# Patient Record
Sex: Male | Born: 1990 | Race: White | Hispanic: No | Marital: Single | Smoking: Current some day smoker
Health system: Southern US, Community
[De-identification: ages and names within clinical notes are randomized; demographics above are authoritative.]

## PROBLEM LIST (undated history)

## (undated) DIAGNOSIS — K5090 Crohn's disease, unspecified, without complications: Secondary | ICD-10-CM

## (undated) DIAGNOSIS — E7800 Pure hypercholesterolemia, unspecified: Secondary | ICD-10-CM

## (undated) DIAGNOSIS — D649 Anemia, unspecified: Secondary | ICD-10-CM

## (undated) DIAGNOSIS — F1910 Other psychoactive substance abuse, uncomplicated: Secondary | ICD-10-CM

## (undated) DIAGNOSIS — F32A Depression, unspecified: Secondary | ICD-10-CM

## (undated) DIAGNOSIS — F329 Major depressive disorder, single episode, unspecified: Secondary | ICD-10-CM

## (undated) DIAGNOSIS — F319 Bipolar disorder, unspecified: Secondary | ICD-10-CM

## (undated) DIAGNOSIS — F1010 Alcohol abuse, uncomplicated: Secondary | ICD-10-CM

## (undated) DIAGNOSIS — F419 Anxiety disorder, unspecified: Secondary | ICD-10-CM

## (undated) DIAGNOSIS — F250 Schizoaffective disorder, bipolar type: Secondary | ICD-10-CM

## (undated) DIAGNOSIS — K259 Gastric ulcer, unspecified as acute or chronic, without hemorrhage or perforation: Secondary | ICD-10-CM

## (undated) DIAGNOSIS — F259 Schizoaffective disorder, unspecified: Secondary | ICD-10-CM

## (undated) DIAGNOSIS — F1990 Other psychoactive substance use, unspecified, uncomplicated: Secondary | ICD-10-CM

## (undated) DIAGNOSIS — T7840XA Allergy, unspecified, initial encounter: Secondary | ICD-10-CM

## (undated) DIAGNOSIS — K9041 Non-celiac gluten sensitivity: Secondary | ICD-10-CM

## (undated) DIAGNOSIS — F988 Other specified behavioral and emotional disorders with onset usually occurring in childhood and adolescence: Secondary | ICD-10-CM

## (undated) HISTORY — DX: Non-celiac gluten sensitivity: K90.41

## (undated) HISTORY — DX: Depression, unspecified: F32.A

## (undated) HISTORY — DX: Anxiety disorder, unspecified: F41.9

## (undated) HISTORY — DX: Alcohol abuse, uncomplicated: F10.10

## (undated) HISTORY — DX: Pure hypercholesterolemia, unspecified: E78.00

## (undated) HISTORY — DX: Other psychoactive substance abuse, uncomplicated: F19.10

## (undated) HISTORY — PX: SMALL INTESTINE SURGERY: SHX150

## (undated) HISTORY — PX: BOWEL RESECTION: SHX1257

## (undated) HISTORY — DX: Gastric ulcer, unspecified as acute or chronic, without hemorrhage or perforation: K25.9

## (undated) HISTORY — DX: Allergy, unspecified, initial encounter: T78.40XA

## (undated) HISTORY — DX: Other specified behavioral and emotional disorders with onset usually occurring in childhood and adolescence: F98.8

## (undated) HISTORY — DX: Major depressive disorder, single episode, unspecified: F32.9

## (undated) HISTORY — DX: Other psychoactive substance use, unspecified, uncomplicated: F19.90

## (undated) HISTORY — DX: Anemia, unspecified: D64.9

---
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
## 2011-06-19 DIAGNOSIS — F22 Delusional disorders: Secondary | ICD-10-CM | POA: Insufficient documentation

## 2011-06-20 ENCOUNTER — Emergency Department (HOSPITAL_COMMUNITY)
Admission: EM | Admit: 2011-06-20 | Discharge: 2011-06-20 | Disposition: A | Discharge status: Other Acute Inpatient Hospital | Payer: BC Managed Care – PPO | Attending: Emergency Medicine | Admitting: Emergency Medicine

## 2011-06-20 ENCOUNTER — Encounter: Payer: Self-pay | Admitting: *Deleted

## 2011-06-20 DIAGNOSIS — F22 Delusional disorders: Secondary | ICD-10-CM

## 2011-06-20 LAB — COMPREHENSIVE METABOLIC PANEL
ALT: 10 U/L (ref 0–53)
Alkaline Phosphatase: 82 U/L (ref 39–117)
BUN: 12 mg/dL (ref 6–23)
CO2: 24 mEq/L (ref 19–32)
Chloride: 105 mEq/L (ref 96–112)
GFR calc Af Amer: >90 mL/min (ref >90)
GFR calc non Af Amer: >90 mL/min (ref >90)
Glucose, Bld: 102 mg/dL — ABNORMAL HIGH (ref 70–99)
Potassium: 3.5 mEq/L (ref 3.5–5.1)
Sodium: 138 mEq/L (ref 135–145)
Total Bilirubin: 0.4 mg/dL (ref 0.3–1.2)

## 2011-06-20 LAB — CBC
HCT: 42.5 % (ref 39.0–52.0)
Hemoglobin: 15.3 g/dL (ref 13.0–17.0)
MCH: 31 pg (ref 26.0–34.0)
MCHC: 36 g/dL (ref 30.0–36.0)
MCV: 86 fL (ref 78.0–100.0)
Platelets: 249 10*3/uL (ref 150–400)
RBC: 4.94 MIL/uL (ref 4.22–5.81)
RDW: 13.1 % (ref 11.5–15.5)
WBC: 10.1 10*3/uL (ref 4.0–10.5)

## 2011-06-20 LAB — ETHANOL: Alcohol, Ethyl (B): <11 mg/dL (ref 0–11)

## 2011-06-20 LAB — RAPID URINE DRUG SCREEN, HOSP PERFORMED
Barbiturates: NOT DETECTED
Benzodiazepines: NOT DETECTED

## 2011-06-20 MED ORDER — ONDANSETRON HCL 4 MG PO TABS: 4.0000 mg | ORAL_TABLET | Freq: Three times a day (TID) | ORAL | Status: DC | PRN

## 2011-06-20 MED ORDER — IBUPROFEN 600 MG PO TABS: 600.0000 mg | ORAL_TABLET | Freq: Three times a day (TID) | ORAL | Status: DC | PRN

## 2011-06-20 MED ORDER — RISPERIDONE 2 MG PO TABS
2.0000 mg | ORAL_TABLET | Freq: Every day | ORAL | Status: DC
  Administered 2011-06-20: 2 mg via ORAL
  Filled 2011-06-20: qty 1

## 2011-06-20 NOTE — ED Notes (Signed)
Pt states he will not allow [place] to draw blood at this time

## 2011-06-20 NOTE — ED Provider Notes (Signed)
History     CSN: [id number] Arrival date & time: 06/20/2011 12:31 AM   First MD Initiated Contact with Patient 06/20/11 0210      Chief Complaint  Patient presents with  . Medical Clearance    (Consider location/radiation/quality/duration/timing/severity/associated sxs/prior treatment) The history is provided by the patient. No language interpreter was used.   Here with [employer] for IVC for delusional and paranoid behavior.  PMH of schizophrenia ? Taking meds.  Talking bazaar and states that the hobbit told him to come here.  Will medically clear for behavior health.   History reviewed. No pertinent past medical history.  History reviewed. No pertinent past surgical history.  History reviewed. No pertinent family history.  History  Substance Use Topics  . Smoking status: Current Everyday Smoker  . Smokeless tobacco: Not on file  . Alcohol Use: No      Review of Systems  All other systems reviewed and are negative.    Allergies  Review of patient's allergies indicates no known allergies.  Home Medications   Current Outpatient Rx  Name Route Sig Dispense Refill  . VITAMIN D 1000 UNITS PO TABS Oral Take 1,000 Units by mouth daily.      Marland Kitchen CLONAZEPAM 1 MG PO TABS Oral Take 2 mg by mouth every evening.      Marland Kitchen CLONAZEPAM 1 MG PO TABS Oral Take 0.5 mg by mouth 2 (two) times daily as needed.      Marland Kitchen FOLIC ACID 1 MG PO TABS Oral Take 1 mg by mouth daily.      Creed Copper M PLUS PO TABS Oral Take 1 tablet by mouth daily.      . QUETIAPINE FUMARATE 400 MG PO TB24 Oral Take 800 mg by mouth at bedtime.      Marland Kitchen VITAMIN B-12 1000 MCG PO TABS Oral Take 2,000 mcg by mouth daily.        BP 99/38  Pulse 84  Temp(Src) 97.8 F (36.6 C) (Oral)  Resp 18  SpO2 99%  Physical Exam  Nursing note and vitals reviewed. Constitutional: He is oriented to person, place, and time. He appears well-developed and well-nourished. He appears distressed.  Eyes: Pupils are equal, round, and reactive to  light.  Neck: Normal range of motion. Neck supple.  Cardiovascular: Normal rate.   Pulmonary/Chest: Effort normal and breath sounds normal. No respiratory distress. He has no wheezes.  Abdominal: Soft. Bowel sounds are normal.  Musculoskeletal: Normal range of motion. He exhibits no edema and no tenderness.  Neurological: He is alert and oriented to person, place, and time.  Skin: Skin is warm and dry. He is not diaphoretic.  Psychiatric: He has a normal mood and affect.    ED Course  Procedures (including critical care time)  Labs Reviewed  COMPREHENSIVE METABOLIC PANEL - Abnormal; Notable for the following:    Glucose, Bld 102 (*)    All other components within normal limits  CBC  ETHANOL  URINE RAPID DRUG SCREEN (HOSP PERFORMED)   No results found.   No diagnosis found.    MDM  Here with IVC papers for bazaar behavior.  Delusional and hallucinating.  Denies suicial ideations and homocidal ideations.          Sheryle Hail, NP 06/20/11 [phone]
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
## 2011-06-20 NOTE — BH Assessment (Signed)
CSW completed 1st examination for patient's IVC. Patient accepted to [facility], by Dr. Reece Levy.  Patient is IVC and will be transported by sheriff. CSW spoke with [facility] and informed them of status.  Labs and mobile crisis assessment were faxed over to [facility]  Patient's nurse notified that patient is ready for transport.  Laurena Spies , MSW, LCSWA 06/20/2011 8:27 AM

## 2011-06-20 NOTE — ED Notes (Signed)
One patient belonging bag locked in activity room.
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
## 2011-06-20 NOTE — ED Provider Notes (Signed)
Medical screening examination/treatment/procedure(s) were performed by non-physician practitioner and as supervising physician I was immediately available for consultation/collaboration.   Mikalah Skyles A. Lauris Poag, MD 06/20/11 2302

## 2011-06-20 NOTE — ED Notes (Signed)
Report given to Debby Freiberg RN at [facility].

## 2011-07-05 ENCOUNTER — Encounter (HOSPITAL_COMMUNITY): Payer: Self-pay | Admitting: *Deleted

## 2011-07-05 ENCOUNTER — Ambulatory Visit (HOSPITAL_COMMUNITY)
Admission: RE | Admit: 2011-07-05 | Discharge: 2011-07-05 | Disposition: A | Discharge status: Home / Self Care | Payer: BC Managed Care – PPO | Source: Ambulatory Visit | Attending: Psychiatry | Admitting: Psychiatry

## 2011-07-05 DIAGNOSIS — F259 Schizoaffective disorder, unspecified: Secondary | ICD-10-CM | POA: Insufficient documentation
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
## 2011-07-21 ENCOUNTER — Encounter (HOSPITAL_COMMUNITY): Payer: Self-pay

## 2011-07-21 ENCOUNTER — Other Ambulatory Visit (HOSPITAL_COMMUNITY): Payer: BC Managed Care – PPO | Attending: Psychiatry

## 2011-07-21 NOTE — Progress Notes (Signed)
Patient ID: Seth Fields, male   DOB: 1990-12-19, 21 y.o.   MRN: [id number] Patient arrived accompanied by father to begin Psych-IOP program. Patient's father completed initial assessment paperwork and then patient accompanied patient to writer's office for initial assessment. During the assessment it appeared that patient was responding to internal stimuli. When patient was asked what his goals where for the IOP program he stated "I want to gain the trust back of my parents and I want them to give me my car back." Patient exhibited disorganized thinking at times. Patient escorted into group room to join group which was already in progress. Patient was pulled out of group mid- morning to speak with Dr. Salem Senate. Patient continued to respond to internal stimuli during Dr. Juliane Lack assessment. After assessment, patient was escorted to waiting room while Dr. Salem Senate and writer spoke with mother on speaker phone to let her know that patient was not appropriate for Psych-IOP. Mother asked for suggestions for further treatment options and it was suggested that patient seek referral to CD-IOP. Patient given contact information to speak with Brandon Melnick (IOP therapist). Mother requested that information on this morning's assessment be communicated to Dr. Reece Levy.
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
## 2011-07-21 NOTE — Progress Notes (Signed)
Patient ID: Seth Fields, male   DOB: [birth year], 21 y.o.   MRN: [id number] Dr. Juliane Lack assessment faxed to [facility].

## 2011-07-24 ENCOUNTER — Encounter (HOSPITAL_COMMUNITY): Payer: Self-pay | Admitting: Emergency Medicine

## 2011-07-24 ENCOUNTER — Other Ambulatory Visit: Payer: Self-pay

## 2011-07-24 ENCOUNTER — Emergency Department (HOSPITAL_COMMUNITY): Payer: BC Managed Care – PPO

## 2011-07-24 ENCOUNTER — Emergency Department (HOSPITAL_COMMUNITY)
Admission: EM | Admit: 2011-07-24 | Discharge: 2011-07-25 | Disposition: A | Discharge status: Other Acute Inpatient Hospital | Payer: BC Managed Care – PPO | Attending: Internal Medicine | Admitting: Internal Medicine

## 2011-07-24 DIAGNOSIS — F329 Major depressive disorder, single episode, unspecified: Secondary | ICD-10-CM

## 2011-07-24 DIAGNOSIS — K5090 Crohn's disease, unspecified, without complications: Secondary | ICD-10-CM | POA: Insufficient documentation

## 2011-07-24 DIAGNOSIS — R002 Palpitations: Secondary | ICD-10-CM | POA: Insufficient documentation

## 2011-07-24 DIAGNOSIS — J45909 Unspecified asthma, uncomplicated: Secondary | ICD-10-CM | POA: Insufficient documentation

## 2011-07-24 DIAGNOSIS — R443 Hallucinations, unspecified: Secondary | ICD-10-CM | POA: Insufficient documentation

## 2011-07-24 DIAGNOSIS — F319 Bipolar disorder, unspecified: Secondary | ICD-10-CM | POA: Insufficient documentation

## 2011-07-24 DIAGNOSIS — F17200 Nicotine dependence, unspecified, uncomplicated: Secondary | ICD-10-CM | POA: Insufficient documentation

## 2011-07-24 HISTORY — DX: Schizoaffective disorder, unspecified: F25.9

## 2011-07-24 HISTORY — DX: Bipolar disorder, unspecified: F31.9

## 2011-07-24 HISTORY — DX: Schizoaffective disorder, bipolar type: F25.0

## 2011-07-24 HISTORY — DX: Crohn's disease, unspecified, without complications: K50.90

## 2011-07-24 LAB — POCT I-STAT, CHEM 8
BUN: 16 mg/dL (ref 6–23)
Chloride: 105 mEq/L (ref 96–112)
Creatinine, Ser: 0.8 mg/dL (ref 0.50–1.35)
Glucose, Bld: 122 mg/dL — ABNORMAL HIGH (ref 70–99)
Potassium: 3.5 mEq/L (ref 3.5–5.1)
Sodium: 140 mEq/L (ref 135–145)

## 2011-07-24 LAB — CBC
HCT: 42.5 % (ref 39.0–52.0)
Hemoglobin: 15.5 g/dL (ref 13.0–17.0)
MCV: 84.3 fL (ref 78.0–100.0)
RBC: 5.04 MIL/uL (ref 4.22–5.81)
RDW: 12.4 % (ref 11.5–15.5)
WBC: 8.5 10*3/uL (ref 4.0–10.5)

## 2011-07-24 LAB — DIFFERENTIAL
Basophils Absolute: 0 10*3/uL (ref 0.0–0.1)
Lymphocytes Relative: 23 % (ref 12–46)
Lymphs Abs: 2 10*3/uL (ref 0.7–4.0)
Monocytes Absolute: 0.6 10*3/uL (ref 0.1–1.0)
Neutro Abs: 5.7 10*3/uL (ref 1.7–7.7)

## 2011-07-24 MED ORDER — SODIUM CHLORIDE 0.9 % IV BOLUS (SEPSIS)
1000.0000 mL | Freq: Once | INTRAVENOUS | Status: AC
Start: 2011-07-24 — End: 2011-07-25
  Administered 2011-07-25: 1000 mL via INTRAVENOUS

## 2011-07-24 NOTE — ED Notes (Signed)
Pt alert, c/o "feeling weird", presents via POV, recently seen in ED for Psych Disorder, resp even, pt appears anxious. Pt denies ingestion of anything not prescribed

## 2011-07-24 NOTE — ED Provider Notes (Signed)
History     CSN: [id number]  Arrival date & time 07/24/11  2304   First MD Initiated Contact with Patient 07/24/11 2319      Chief Complaint  Patient presents with  . Tachycardia  . Medical Clearance    (Consider location/radiation/quality/duration/timing/severity/associated sxs/prior treatment) Patient is a 21 y.o. male presenting with mental health disorder and shortness of breath. The history is provided by the patient and a parent.  Mental Health Problem The primary symptoms include dysphoric mood and hallucinations. The current episode started today. This is a recurrent problem.  The hallucinations began this week. The hallucinations appear to have been unchanged since their onset.  Precipitated by: nothing. The degree of incapacity that he is experiencing as a consequence of his illness is moderate. Additional symptoms of the illness do not include no insomnia, no hypersomnia, no appetite change, no unexpected weight change, no fatigue, no feelings of worthlessness, no increased goal-directed activity, no flight of ideas, no visual change, no abdominal pain or no seizures. He does not admit to suicidal ideas. He does not have a plan to commit suicide. He does not contemplate harming himself. He has not already injured self. He does not contemplate injuring another person. He has not already  injured another person. Risk factors that are present for mental illness include a history of mental illness.  Shortness of Breath  The current episode started today. The onset was sudden. The problem occurs continuously. The problem has been unchanged. The problem is moderate. The symptoms are relieved by rest. Associated symptoms include shortness of breath. Pertinent negatives include no chest pain, no chest pressure, no orthopnea, no fever, no sore throat, no stridor, no cough and no wheezing. There was no intake of a foreign body. He was not exposed to toxic fumes. He has not inhaled smoke  recently. He has had no prior steroid use. He has had no prior ICU admissions. His past medical history does not include asthma or past wheezing. He has been behaving normally. Urine output has decreased. There were no sick contacts. Recently, medical care has been given at this facility. Services received include tests performed.  Mother reports she doses the patient's meds and he could not have taken extra but patient reports he took his own meds.  States he did not take anything that wasn't prescribed to him  Past Medical History  Diagnosis Date  . Asthma     Pt reports this is resolved  . Bipolar affective   . Schizo affective schizophrenia   . Crohn's disease     Past Surgical History  Procedure Date  . Abdominal surgery     No family history on file.  History  Substance Use Topics  . Smoking status: Current Everyday Smoker -- 0.1 packs/day    Types: Cigarettes  . Smokeless tobacco: Not on file  . Alcohol Use: No      Review of Systems  Constitutional: Negative for fever, appetite change, fatigue and unexpected weight change.  HENT: Negative for sore throat.   Eyes: Negative.   Respiratory: Positive for shortness of breath. Negative for cough, wheezing and stridor.   Cardiovascular: Negative for chest pain and orthopnea.  Gastrointestinal: Negative for nausea, vomiting, abdominal pain and diarrhea.  Genitourinary: Negative.   Musculoskeletal: Negative.   Skin: Negative.   Neurological: Negative for seizures.  Psychiatric/Behavioral: Positive for hallucinations and dysphoric mood. Negative for suicidal ideas and self-injury. The patient is nervous/anxious. The patient does not have insomnia.  Allergies  Seasonal  Home Medications   Current Outpatient Rx  Name Route Sig Dispense Refill  . AZATHIOPRINE 50 MG PO TABS Oral Take 100 mg by mouth daily. Cont home med    . BENZTROPINE MESYLATE 1 MG PO TABS Oral Take 1 mg by mouth 2 (two) times daily.      Marland Kitchen VITAMIN  D 1000 UNITS PO TABS Oral Take 1,000 Units by mouth daily.      Marland Kitchen CLONAZEPAM 1 MG PO TABS Oral Take 2 mg by mouth every evening.      Marland Kitchen CLONAZEPAM 1 MG PO TABS Oral Take 0.5 mg by mouth 2 (two) times daily as needed.      Marland Kitchen DIVALPROEX SODIUM ER 500 MG PO TB24 Oral Take 1,500 mg by mouth daily. 1500 mg po q pm    . FOLIC ACID 1 MG PO TABS Oral Take 1 mg by mouth daily.      Creed Copper M PLUS PO TABS Oral Take 1 tablet by mouth daily.      Marland Kitchen PALIPERIDONE ER 6 MG PO TB24 Oral Take 6 mg by mouth daily.     Marland Kitchen PALIPERIDONE PALMITATE 117 MG/0.75ML IM SUSP Intramuscular Inject 117 mg/mL into the muscle every 30 (thirty) days.    Marland Kitchen QUETIAPINE FUMARATE ER 400 MG PO TB24 Oral Take 800 mg by mouth at bedtime.     Marland Kitchen VITAMIN B-12 1000 MCG PO TABS Oral Take 2,000 mcg by mouth daily.        BP 127/52  Pulse 180  Temp 98 F (36.7 C)  Resp 22  Wt 160 lb (72.576 kg)  SpO2 99%  Physical Exam  Constitutional: He is oriented to person, place, and time. He appears well-developed and well-nourished.  HENT:  Head: Normocephalic and atraumatic.  Mouth/Throat: No oropharyngeal exudate.       Tacky mucus membranes  Eyes: Conjunctivae are normal. Pupils are equal, round, and reactive to light.  Neck: Normal range of motion. Neck supple. No tracheal deviation present.  Cardiovascular: Regular rhythm.  Tachycardia present.   Pulmonary/Chest: Effort normal and breath sounds normal. He has no wheezes. He has no rales.  Abdominal: Soft. Bowel sounds are normal. There is no tenderness. There is no rebound and no guarding.  Musculoskeletal: Normal range of motion.  Lymphadenopathy:    He has no cervical adenopathy.  Neurological: He is alert and oriented to person, place, and time. He has normal reflexes.  Skin: Skin is warm and dry. He is not diaphoretic.  Psychiatric: His mood appears anxious. He is actively hallucinating. Thought content is paranoid and delusional.    ED Course  Procedures (including critical  care time)   Labs Reviewed  CBC  DIFFERENTIAL  I-STAT, CHEM 8  URINE RAPID DRUG SCREEN (HOSP PERFORMED)  ACETAMINOPHEN LEVEL  SALICYLATE LEVEL  VALPROIC ACID LEVEL  HEPATIC FUNCTION PANEL  ETHANOL   No results found.   No diagnosis found.    MDM   Date: 07/25/2011  Rate: 126  Rhythm: sinus tachycardia  QRS Axis: normal  Intervals: normal  ST/T Wave abnormalities: normal  Conduction Disutrbances:none  Narrative Interpretation:   Old EKG Reviewed: none available  3 liters of fluid given to rehydrate patient and slow down heart rate.  Now less than 100, is able to PO challenge will send to the psych ED negative dimer and troponin, likely medication effect  MDM Reviewed: previous chart and vitals Interpretation: labs, ECG and x-ray    CRITICAL CARE  Performed by: Carlisle Beers   Total critical care time: 60 minutes  Critical care time was exclusive of separately billable procedures and treating other patients.  Critical care was necessary to treat or prevent imminent or life-threatening deterioration.  Critical care was time spent personally by me on the following activities: development of treatment plan with patient and/or surrogate as well as nursing, discussions with consultants, evaluation of patient's response to treatment, examination of patient, obtaining history from patient or surrogate, ordering and performing treatments and interventions, ordering and review of laboratory studies, ordering and review of radiographic studies, pulse oximetry and re-evaluation of patient's condition.     Carlisle Beers, MD 07/25/11 [phone]

## 2011-07-25 LAB — POCT I-STAT TROPONIN I: Troponin i, poc: 0 ng/mL (ref 0.00–0.08)

## 2011-07-25 LAB — HEPATIC FUNCTION PANEL
ALT: 10 U/L (ref 0–53)
AST: 21 U/L (ref 0–37)
Albumin: 4.3 g/dL (ref 3.5–5.2)

## 2011-07-25 LAB — RAPID URINE DRUG SCREEN, HOSP PERFORMED
Amphetamines: NOT DETECTED
Benzodiazepines: NOT DETECTED
Opiates: NOT DETECTED
Tetrahydrocannabinol: NOT DETECTED

## 2011-07-25 LAB — VALPROIC ACID LEVEL: Valproic Acid Lvl: 91.2 ug/mL (ref 50.0–100.0)

## 2011-07-25 LAB — SALICYLATE LEVEL: Salicylate Lvl: <2 mg/dL — ABNORMAL LOW (ref 2.8–20.0)

## 2011-07-25 MED ORDER — ONDANSETRON HCL 4 MG PO TABS: 4.0000 mg | ORAL_TABLET | Freq: Three times a day (TID) | ORAL | Status: DC | PRN

## 2011-07-25 MED ORDER — LORAZEPAM 2 MG/ML IJ SOLN
0.5000 mg | Freq: Once | INTRAMUSCULAR | Status: AC
  Administered 2011-07-25: 0.5 mg via INTRAVENOUS
  Filled 2011-07-25: qty 1

## 2011-07-25 MED ORDER — SODIUM CHLORIDE 0.9 % IV BOLUS (SEPSIS)
1000.0000 mL | Freq: Once | INTRAVENOUS | Status: AC
  Administered 2011-07-25: 1000 mL via INTRAVENOUS

## 2011-07-25 MED ORDER — IBUPROFEN 600 MG PO TABS: 600.0000 mg | ORAL_TABLET | Freq: Three times a day (TID) | ORAL | Status: DC | PRN

## 2011-07-25 MED ORDER — ACETAMINOPHEN 325 MG PO TABS: 650.0000 mg | ORAL_TABLET | ORAL | Status: DC | PRN

## 2011-07-25 NOTE — ED Notes (Signed)
Patient's mother leaving at this time. Patient's mother verbalized importance of having her name and phone numbers on file because she is the [place]. Patient's mother is Ezechiel Stooksbury and she can be reached by cell phone [phone] or home phone [phone].
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
## 2011-07-25 NOTE — ED Notes (Signed)
Patient reports he is "hallucinating" but unable to verbalize what he sees.  Also states "I feel like I'm going to die". Dr. Chaya Jan notified and aware. Patient continues to be on the monitor with mother at bedside.

## 2011-07-25 NOTE — ED Provider Notes (Signed)
History     CSN: [id number]  Arrival date & time 07/24/11  2304   First MD Initiated Contact with Patient 07/24/11 2319      Chief Complaint  Patient presents with  . Tachycardia  . Medical Clearance    (Consider location/radiation/quality/duration/timing/severity/associated sxs/prior treatment) HPI  Past Medical History  Diagnosis Date  . Asthma     Pt reports this is resolved  . Bipolar affective   . Schizo affective schizophrenia   . Crohn's disease     Past Surgical History  Procedure Date  . Abdominal surgery     No family history on file.  History  Substance Use Topics  . Smoking status: Current Everyday Smoker -- 0.1 packs/day    Types: Cigarettes  . Smokeless tobacco: Not on file  . Alcohol Use: No      Review of Systems  Allergies  Gluten and Seasonal  Home Medications   Current Outpatient Rx  Name Route Sig Dispense Refill  . AZATHIOPRINE 50 MG PO TABS Oral Take 100 mg by mouth daily. Cont home med    . BENZTROPINE MESYLATE 1 MG PO TABS Oral Take 1 mg by mouth 2 (two) times daily.      Marland Kitchen VITAMIN D 1000 UNITS PO TABS Oral Take 1,000 Units by mouth daily.      Marland Kitchen CLONAZEPAM 1 MG PO TABS Oral Take 2 mg by mouth every evening.      Marland Kitchen CLONAZEPAM 1 MG PO TABS Oral Take 0.5 mg by mouth 2 (two) times daily as needed.      Marland Kitchen DIVALPROEX SODIUM ER 500 MG PO TB24 Oral Take 1,500 mg by mouth daily. 1500 mg po q pm    . FOLIC ACID 1 MG PO TABS Oral Take 1 mg by mouth daily.      Creed Copper M PLUS PO TABS Oral Take 1 tablet by mouth daily.      Marland Kitchen PALIPERIDONE ER 6 MG PO TB24 Oral Take 6 mg by mouth daily.     Marland Kitchen PALIPERIDONE PALMITATE 117 MG/0.75ML IM SUSP Intramuscular Inject 117 mg/mL into the muscle every 30 (thirty) days.    Marland Kitchen QUETIAPINE FUMARATE ER 400 MG PO TB24 Oral Take 800 mg by mouth at bedtime.     Marland Kitchen VITAMIN B-12 1000 MCG PO TABS Oral Take 2,000 mcg by mouth daily.        BP 119/74  Pulse 94  Temp(Src) 98.4 F (36.9 C) (Oral)  Resp 18  Wt 160  lb (72.576 kg)  SpO2 96%  Physical Exam  ED Course  Procedures (including critical care time)  Labs Reviewed  CBC - Abnormal; Notable for the following:    MCHC 36.5 (*)    All other components within normal limits  SALICYLATE LEVEL - Abnormal; Notable for the following:    Salicylate Lvl <7.7 (*)    All other components within normal limits  POCT I-STAT, CHEM 8 - Abnormal; Notable for the following:    Glucose, Bld 122 (*)    All other components within normal limits  DIFFERENTIAL  URINE RAPID DRUG SCREEN (HOSP PERFORMED)  ACETAMINOPHEN LEVEL  VALPROIC ACID LEVEL  HEPATIC FUNCTION PANEL  ETHANOL  D-DIMER, QUANTITATIVE  POCT I-STAT TROPONIN I  I-STAT, CHEM 8  I-STAT TROPONIN I   Dg Chest 2 View  07/24/2011  *RADIOLOGY REPORT*  Clinical Data: Chest pain  CHEST - 2 VIEW  Comparison: None.  Findings: Lungs are essentially clear.  No pleural effusion or  pneumothorax.  Cardiomediastinal silhouette is within normal limits.  Visualized osseous structures are within normal limits.  IMPRESSION: No evidence of acute cardiopulmonary disease.  Original Report Authenticated By: Julian Hy, M.D.     No diagnosis found.    MDM  Patient admitted to [facility]. Dr. Reece Levy accepting physician          Nat Christen, MD 08/01/11 [phone]

## 2011-07-25 NOTE — ED Notes (Signed)
Pt. Pulse rate 115 walking.

## 2011-07-25 NOTE — ED Notes (Signed)
Palumbo, MD at bedside.

## 2011-07-25 NOTE — ED Notes (Addendum)
Excuse last note family was not at bedside

## 2011-07-25 NOTE — ED Notes (Signed)
Father called, wanted to speak to patient, patient said he would rather not talk to father and protect his privacy

## 2011-07-25 NOTE — ED Notes (Addendum)
Info sent to [facility] and [facility] for review. TC from [facility] @ [place]. Pt accepted by Dr. Reece Levy. RN report to be called to [phone].

## 2011-10-03 DIAGNOSIS — K50819 Crohn's disease of both small and large intestine with unspecified complications: Secondary | ICD-10-CM | POA: Insufficient documentation

## 2012-01-30 ENCOUNTER — Ambulatory Visit (INDEPENDENT_AMBULATORY_CARE_PROVIDER_SITE_OTHER): Payer: BC Managed Care – PPO | Admitting: Internal Medicine

## 2012-01-30 VITALS — BP 128/80 | HR 94 | Temp 98.9°F | Resp 20 | Ht 72.0 in | Wt 164.0 lb

## 2012-01-30 DIAGNOSIS — F1610 Hallucinogen abuse, uncomplicated: Secondary | ICD-10-CM

## 2012-01-30 DIAGNOSIS — K5090 Crohn's disease, unspecified, without complications: Secondary | ICD-10-CM | POA: Insufficient documentation

## 2012-01-30 DIAGNOSIS — F909 Attention-deficit hyperactivity disorder, unspecified type: Secondary | ICD-10-CM

## 2012-01-30 DIAGNOSIS — F2081 Schizophreniform disorder: Secondary | ICD-10-CM

## 2012-01-30 DIAGNOSIS — F411 Generalized anxiety disorder: Secondary | ICD-10-CM

## 2012-01-30 DIAGNOSIS — F1410 Cocaine abuse, uncomplicated: Secondary | ICD-10-CM

## 2012-01-30 MED ORDER — ARIPIPRAZOLE ER 400 MG IM SUSR: 400.0000 mg | INTRAMUSCULAR | Status: DC

## 2012-01-30 MED ORDER — ARIPIPRAZOLE ER 400 MG IM SUSR
400.0000 mg | Freq: Once | INTRAMUSCULAR | Status: AC
  Administered 2012-01-30: 400 mg via INTRAMUSCULAR
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
## 2012-02-10 ENCOUNTER — Telehealth: Payer: Self-pay

## 2012-02-10 NOTE — Telephone Encounter (Signed)
[employer] called on behalf of the patient because patient was prescribed an injection form of abilify that needs to be administered by a provider. Patient is not able to purchase this from pharmacy since MD has to administer. Pt's psychiatrist will not purchase the rx in advance and bill [employer] who will then pay in full in order to administer to the patient. They are wondering if we can do this for the patient. He is also a patient of Dr. Laney Pastor.  Tiffany from [facility] [phone]

## 2012-02-10 NOTE — Telephone Encounter (Signed)
Call [facility] Can we send him over with a prescription which he then brings back for [place] to administer? Psychiatry would need to provide [place] with a dose and frequency of administration written as an order

## 2012-02-10 NOTE — Telephone Encounter (Signed)
See message, if patient gets injectible form of abilify can we administer here?

## 2012-02-11 NOTE — Telephone Encounter (Signed)
Seth Fields with Seth Fields will cover the medications at 100%. Physician office has to purchase the medication $1900 then file the medication with [employer] they will cover, they want to know if we will purchase the medication to administer it to him, then file it to [facility]. Please advise, I told her I do not know if we can purchase a medication this expensive for him without seeking an alternative first.

## 2012-02-11 NOTE — Telephone Encounter (Signed)
This is not something we can do The psychiatrist has responsibility to figure this out for him, not us-(as much as I respect his Mom) If you will get me the psychiatrist name I'll call and ask them why they can't do this Tell Mom the psych group has to be responsible here

## 2012-02-11 NOTE — Telephone Encounter (Signed)
Left message for Tiffany to call back.

## 2012-02-13 NOTE — Telephone Encounter (Signed)
Have called Tiffany to advise we can not purchase the medication, if he can get it from another source, we could administer.

## 2012-02-24 ENCOUNTER — Encounter: Payer: Self-pay | Admitting: Internal Medicine

## 2012-03-01 IMAGING — CR DG CHEST 2V
2 series · 2 of 2 positions shown · non-contrast
Comparison: None.

CLINICAL DATA: Chest pain

CHEST - 2 VIEW

[w chest pa]
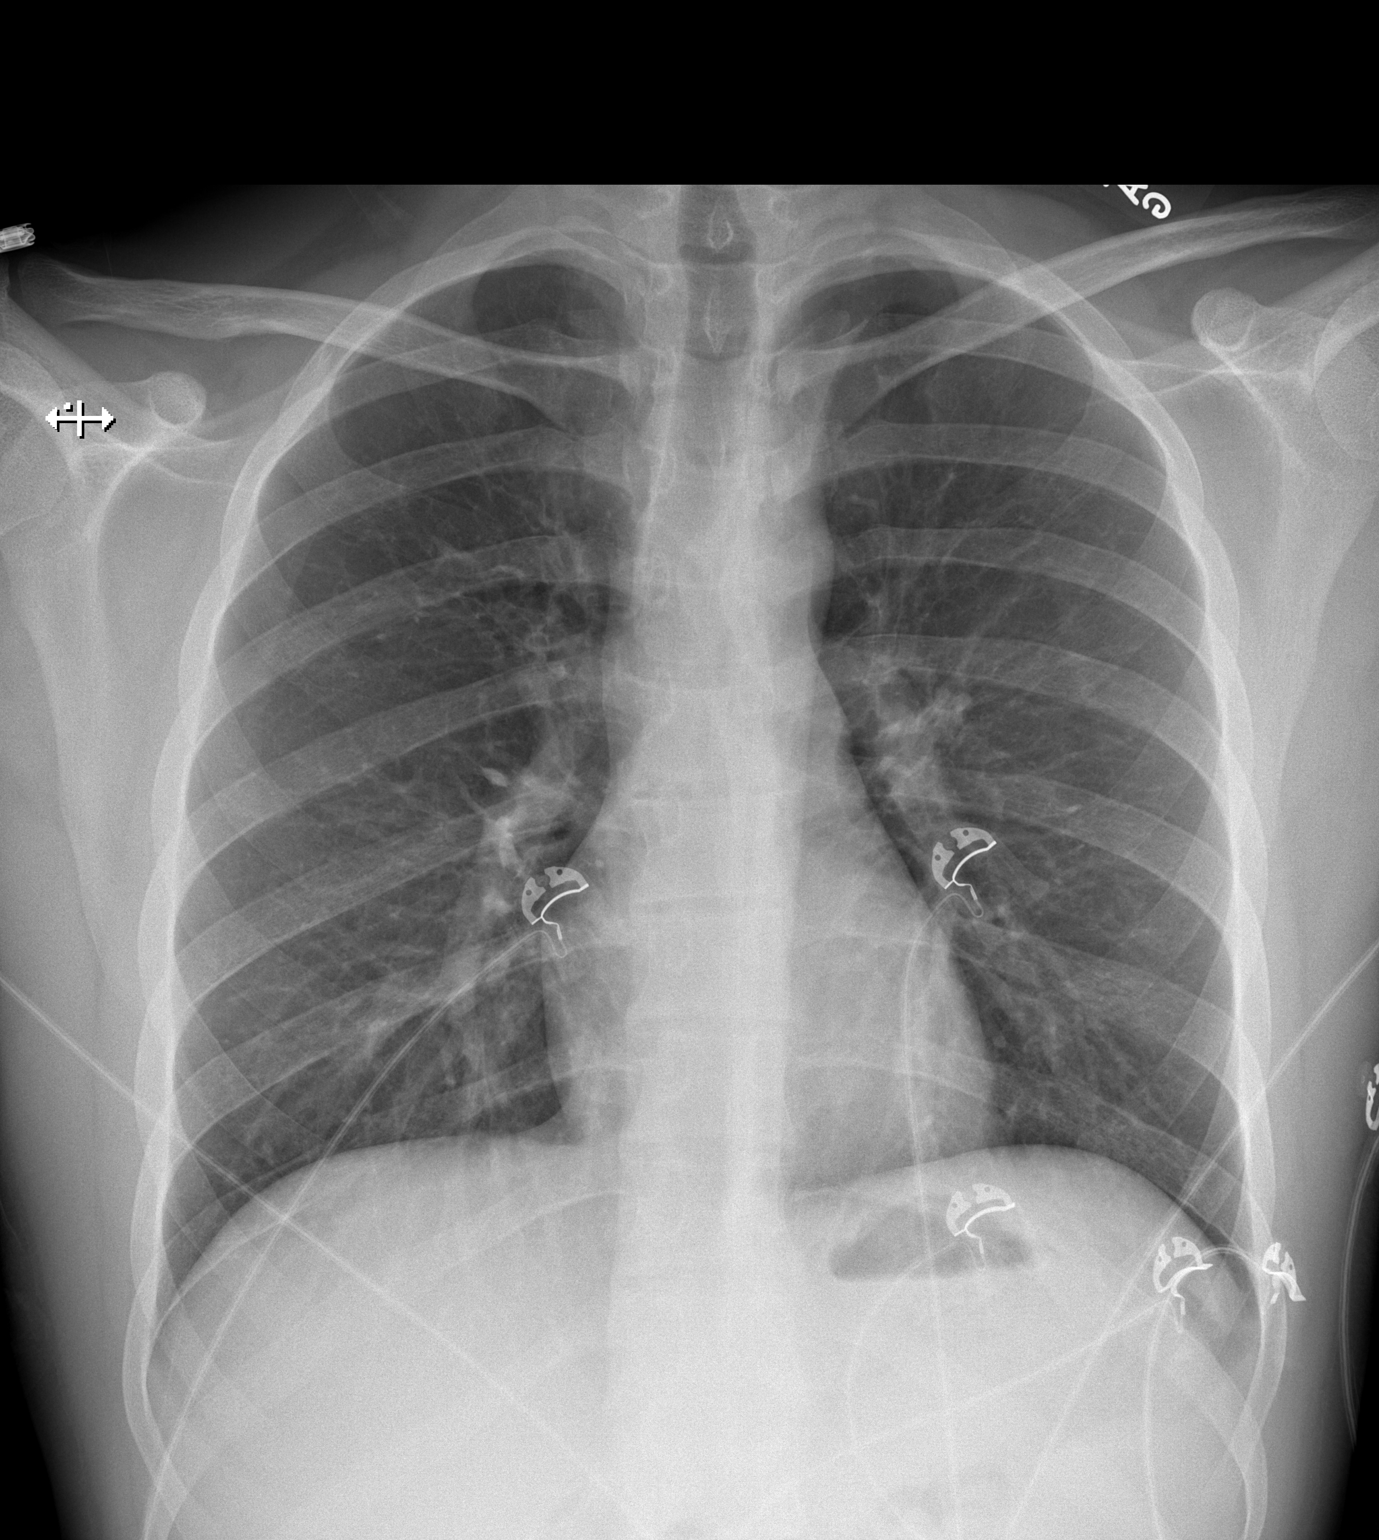

[w chest lat]
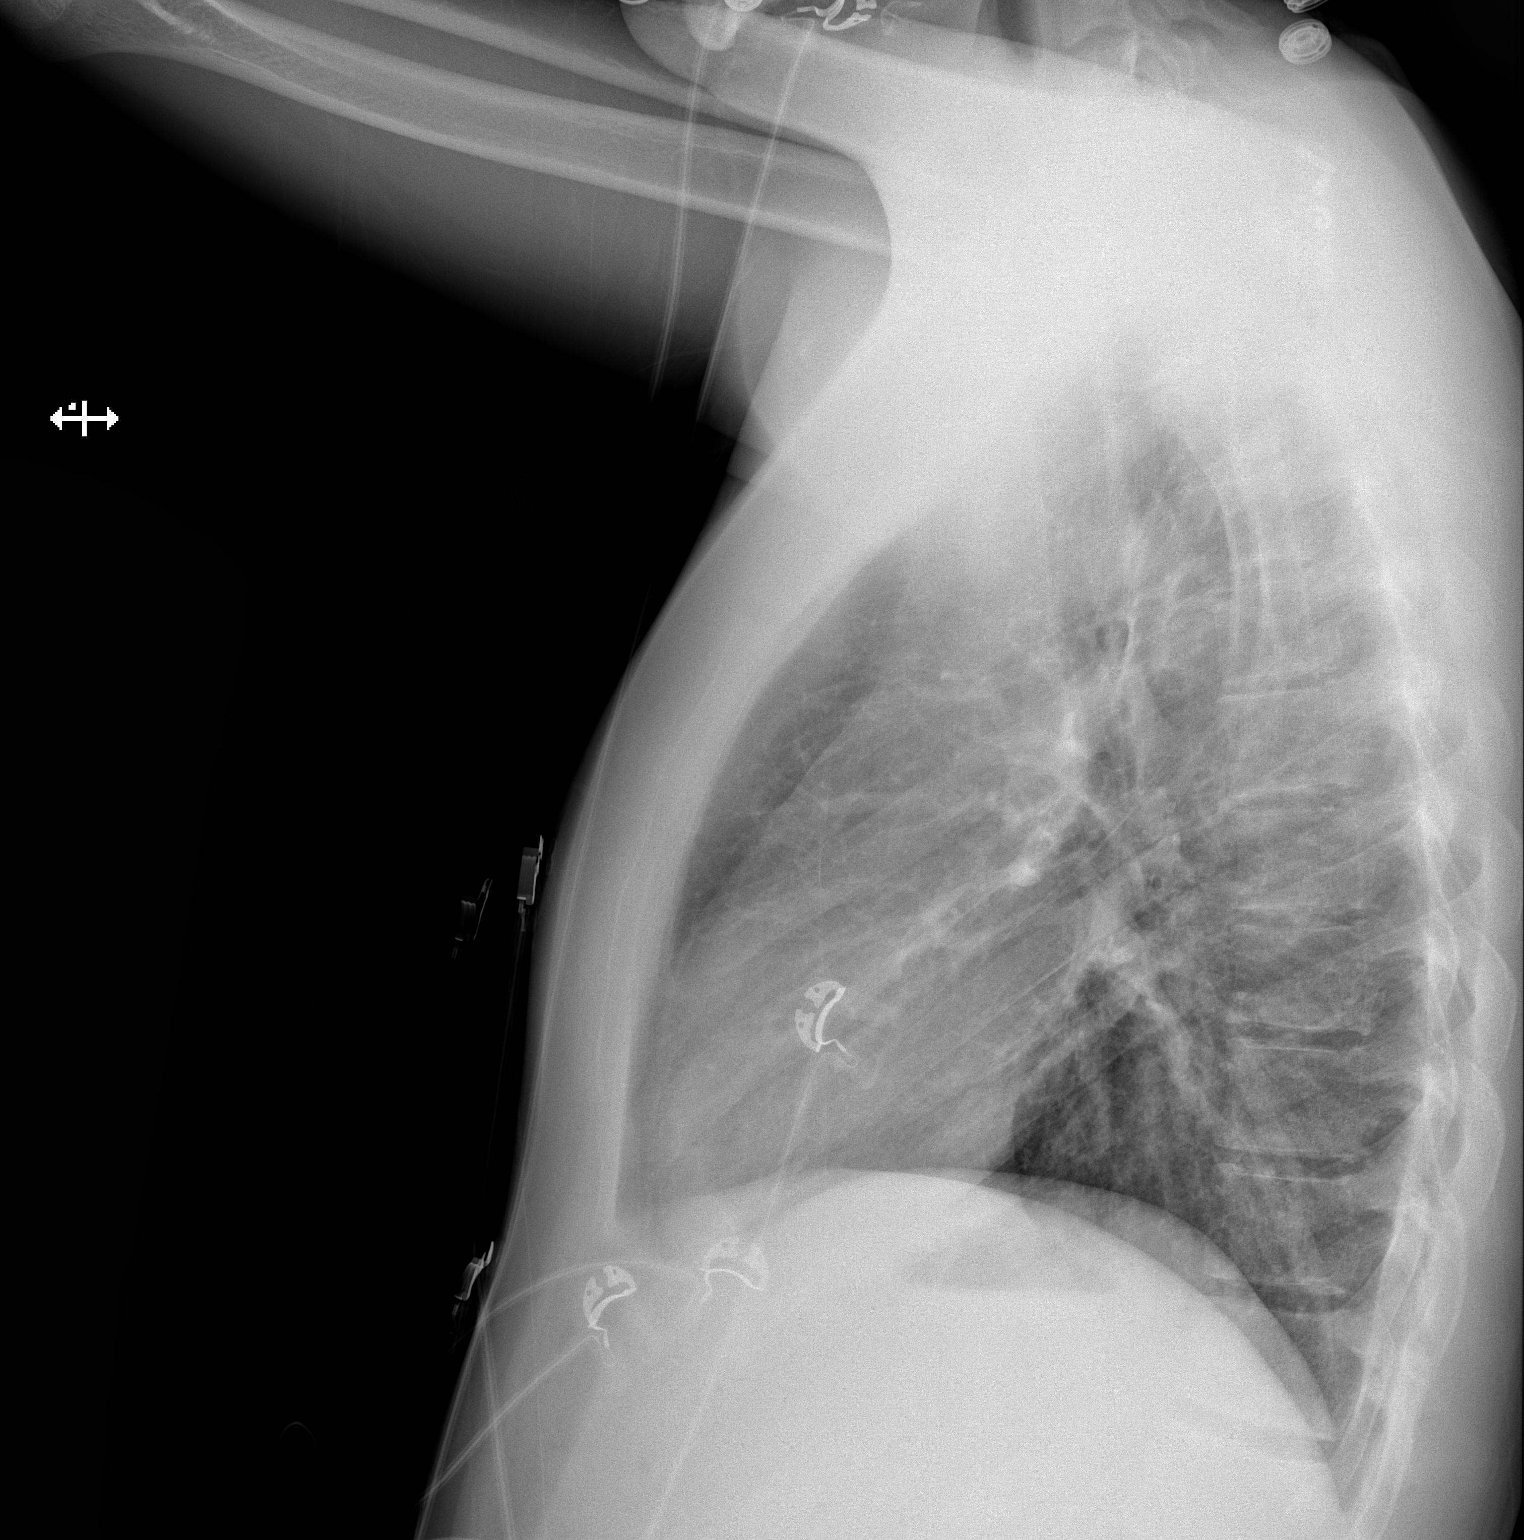

[2 of 2 positions shown; findings below may reference images not displayed]

FINDINGS: Lungs are essentially clear.  No pleural effusion or
pneumothorax.

Cardiomediastinal silhouette is within normal limits.

Visualized osseous structures are within normal limits.
IMPRESSION: No evidence of acute cardiopulmonary disease.

## 2012-08-08 ENCOUNTER — Other Ambulatory Visit: Payer: Self-pay | Admitting: Family Medicine

## 2012-08-08 ENCOUNTER — Ambulatory Visit: Payer: BC Managed Care – PPO

## 2012-08-08 ENCOUNTER — Ambulatory Visit (INDEPENDENT_AMBULATORY_CARE_PROVIDER_SITE_OTHER): Payer: BC Managed Care – PPO | Admitting: Family Medicine

## 2012-08-08 VITALS — BP 102/67 | HR 84 | Temp 97.7°F | Resp 14 | Ht 71.26 in | Wt 178.0 lb

## 2012-08-08 DIAGNOSIS — K5090 Crohn's disease, unspecified, without complications: Secondary | ICD-10-CM

## 2012-08-08 DIAGNOSIS — K900 Celiac disease: Secondary | ICD-10-CM

## 2012-08-08 DIAGNOSIS — R1013 Epigastric pain: Secondary | ICD-10-CM

## 2012-08-08 DIAGNOSIS — K9041 Non-celiac gluten sensitivity: Secondary | ICD-10-CM

## 2012-08-08 DIAGNOSIS — R52 Pain, unspecified: Secondary | ICD-10-CM

## 2012-08-08 DIAGNOSIS — R197 Diarrhea, unspecified: Secondary | ICD-10-CM

## 2012-08-08 LAB — POCT CBC
Granulocyte percent: 71.7 %G (ref 37–80)
HCT, POC: 46.1 % (ref 43.5–53.7)
Hemoglobin: 15 g/dL (ref 14.1–18.1)
Lymph, poc: 2.5 (ref 0.6–3.4)
MCH, POC: 25.7 pg — AB (ref 27–31.2)
MCHC: 32.5 g/dL (ref 31.8–35.4)
MCV: 79.1 fL — AB (ref 80–97)
MID (cbc): 0.9 (ref 0–0.9)
MPV: 8 fL (ref 0–99.8)
POC Granulocyte: 8.5 — AB (ref 2–6.9)
POC LYMPH PERCENT: 20.6 %L (ref 10–50)
POC MID %: 7.7 % (ref 0–12)
Platelet Count, POC: 482 10*3/uL — AB (ref 142–424)
RBC: 5.83 M/uL (ref 4.69–6.13)
RDW, POC: 14 %
WBC: 11.9 10*3/uL — AB (ref 4.6–10.2)

## 2012-08-08 LAB — POCT URINALYSIS DIPSTICK
Blood, UA: NEGATIVE
Glucose, UA: NEGATIVE
Leukocytes, UA: NEGATIVE
Nitrite, UA: NEGATIVE
Protein, UA: 30
Spec Grav, UA: 1.025
Urobilinogen, UA: 0.2
pH, UA: 6

## 2012-08-08 LAB — POCT UA - MICROSCOPIC ONLY
Bacteria, U Microscopic: NEGATIVE
Casts, Ur, LPF, POC: NEGATIVE
Crystals, Ur, HPF, POC: NEGATIVE
Epithelial cells, urine per micros: NEGATIVE
Mucus, UA: NEGATIVE
Yeast, UA: NEGATIVE

## 2012-08-08 LAB — POCT SEDIMENTATION RATE: POCT SED RATE: 36 mm/h — AB (ref 0–22)

## 2012-08-08 NOTE — Progress Notes (Signed)
 [facility]:  Office Visit  Chief Complaint:  Chief Complaint  Patient presents with  . Abdominal Pain    x 1 week - no nausea, no vomiting  . Diarrhea     2-3 episodes x 1 week  . Crohn's Disease    dx 2000  . Rectal Bleeding    pt states dark red colored    HPI: Seth Fields is a 22 y.o. male who complains of  midepigastric Stomach pains x 1 week. Bright red Bloody diarrhea 2x daily for 7 days. Sharp diffuse midepigastric pain. Dx with Crohn's in 2000. GI doctor is Vance Peper , last saw him on Jun 09, 2012. Marland Kitchen No recent travels, no new foods. No new meds/abx. [place]. Denies nausea.vomiting. Denies fevers or chills. Bright to dark red. He also has a gluten intolerance and is supposed to be on a gluten free diet but has not been following a strict gluten free diet since he has moved out of the house. Per mom who is at this visit he was supposed to be on  IV Remicade but stopped getting it because he was having schizophrenic paranoia that people were trying to control/ hurt him through IV injections. He is currently only on azathiopurine for his Crohn's disease and was fairly well controlled with this. He states this feels different then his crohn's flare up. He wants an antibiotic to get the "paraasites"out of him. Denies following gluten free diet.  Was on Pentasa, Was on steroids, Was on Remicade. Has been on Azathiopurine for 1 year  Past Medical History  Diagnosis Date  . Asthma     Pt reports this is resolved  . Bipolar affective   . Schizo affective schizophrenia   . Crohn's disease   . Gluten intolerance    Past Surgical History  Procedure Date  . Abdominal surgery   . Small intestine surgery    History   Social History  . Marital Status: Single    Spouse Name: N/A    Number of Children: N/A  . Years of Education: N/A   Social History Main Topics  . Smoking status: Current Every Day Smoker -- 0.2 packs/day for 5 years    Types: Cigarettes   . Smokeless tobacco: Never Used  . Alcohol Use: No  . Drug Use: No  . Sexually Active: None   Other Topics Concern  . None   Social History Narrative  . None   No family history on file. Allergies  Allergen Reactions  . Cholestatin   . Gluten    Prior to Admission medications   Medication Sig Start Date End Date Taking? Authorizing Provider  ARIPiprazole (ABILIFY MAINTENA) 300 MG SUSR Inject 300 mg into the muscle every 28 (twenty-eight) days. Last injection 08/03/2012   Yes Historical Provider, MD  azaTHIOprine (IMURAN) 50 MG tablet Take 100 mg by mouth daily. Cont home med 07/21/11  Yes Historical Provider, MD  cholecalciferol (VITAMIN D) 1000 UNITS tablet Take 5,000 Units by mouth daily.    Yes Historical Provider, MD  citalopram (CELEXA) 20 MG tablet Take 20 mg by mouth daily.   Yes Historical Provider, MD  folic acid (FOLVITE) 1 MG tablet Take 1 mg by mouth daily.     Yes Historical Provider, MD  hydrOXYzine (VISTARIL) 25 MG capsule Take 25 mg by mouth 3 (three) times daily as needed.   Yes Historical Provider, MD  methylphenidate [facility]) 10 mg/9hr Place 1 patch onto the skin daily.  wear patch for 9 hours only each day   Yes Historical Provider, MD  Multiple Vitamins-Minerals (MULTIVITAMINS THER. W/MINERALS) TABS Take 1 tablet by mouth daily.     Yes Historical Provider, MD  vitamin B-12 (CYANOCOBALAMIN) 1000 MCG tablet Take 2,500 mcg by mouth daily.    Yes Historical Provider, MD     ROS: The patient denies fevers, chills, night sweats, unintentional weight loss, chest pain, palpitations, wheezing, dyspnea on exertion, nausea, vomiting, dysuria, hematuria, numbness, weakness, or tingling.  All other systems have been reviewed and were otherwise negative with the exception of those mentioned in the HPI and as above.    PHYSICAL EXAM: Filed Vitals:   08/08/12 1502  BP: 102/67  Pulse: 84  Temp: 97.7 F (36.5 C)  Resp: 14   Filed Vitals:   08/08/12 1502  Height: 5'  11.26" (1.81 m)  Weight: 178 lb (80.74 kg)   Body mass index is 24.65 kg/(m^2).  General: Alert, no acute distress. He is pacing, flat affect. Does not appear toxic HEENT:  Normocephalic, atraumatic, oropharynx patent. Dry oral mucosa. Tm nl. No exudates Cardiovascular:  Regular rate and rhythm, no rubs murmurs or gallops.  No Carotid bruits, radial pulse intact. No pedal edema.  Respiratory: Clear to auscultation bilaterally.  No wheezes, rales, or rhonchi.  No cyanosis, no use of accessory musculature GI: No organomegaly, abdomen is soft, ,minimally-tender on palpation midepigastric, positive bowel sounds.  No masses. Does not appear to be an acute abdomen, no peritoneal signs. + surgical scar Skin: No rashes. Neurologic: Facial musculature symmetric. Psychiatric: Patient is flat but responsive and not confused throughout our interaction. Lymphatic: No cervical lymphadenopathy Musculoskeletal: Gait intact.  Stool is loose, brown runny pasteand has bright red blood, very strong odor.   LABS: Results for orders placed in visit on 08/08/12  POCT CBC      Component Value Range   WBC 11.9 (*) 4.6 - 10.2 K/uL   Lymph, poc 2.5  0.6 - 3.4   POC LYMPH PERCENT 20.6  10 - 50 %L   MID (cbc) 0.9  0 - 0.9   POC MID % 7.7  0 - 12 %M   POC Granulocyte 8.5 (*) 2 - 6.9   Granulocyte percent 71.7  37 - 80 %G   RBC 5.83  4.69 - 6.13 M/uL   Hemoglobin 15.0  14.1 - 18.1 g/dL   HCT, POC 46.1  43.5 - 53.7 %   MCV 79.1 (*) 80 - 97 fL   MCH, POC 25.7 (*) 27 - 31.2 pg   MCHC 32.5  31.8 - 35.4 g/dL   RDW, POC 14.0     Platelet Count, POC 482 (*) 142 - 424 K/uL   MPV 8.0  0 - 99.8 fL  POCT URINALYSIS DIPSTICK      Component Value Range   Color, UA orange     Clarity, UA clear     Glucose, UA neg     Bilirubin, UA small     Ketones, UA trace     Spec Grav, UA 1.025     Blood, UA neg     pH, UA 6.0     Protein, UA 30     Urobilinogen, UA 0.2     Nitrite, UA neg     Leukocytes, UA Negative     POCT UA - MICROSCOPIC ONLY      Component Value Range   WBC, Ur, HPF, POC 0-1     RBC, urine,  microscopic 0-1     Bacteria, U Microscopic neg     Mucus, UA neg     Epithelial cells, urine per micros neg     Crystals, Ur, HPF, POC neg     Casts, Ur, LPF, POC neg     Yeast, UA neg    POCT SEDIMENTATION RATE      Component Value Range   POCT SED RATE 36 (*) 0 - 22 mm/hr     EKG/XRAY:   Primary read interpreted by Dr. Marin Comment at [facility]. No acute cardiopulmonary process Normal abdomen-no free air, no dilated loops of bowel     ASSESSMENT/PLAN: Encounter Diagnoses  Name Primary?  . Abdominal pain, acute, epigastric Yes  . Crohn's disease   . Gluten intolerance   . Diarrhea    Crohn's Flare up vs infectious colitis vs abscess/fistula? Spoke with Dr. Britta Mccreedy ( on call MD for [facility])  He Recommended waiting for C- difficile results and then also get CT abd and pelvis with and without oral IV contrast if patient able to tolerate IV if C-diff is negative. Advise not to treat with empiric abx until get results.  Patient just wants oral contrast. I will go ahead and get this CT pre-authorized while waiting for C. Diff results.  We will not empirically treat with abx. Advise to push fluids at home, follow gluten free diet since he is having so much diarrhea. He did not want any IVF in office.  F/u with Dr. Vance Peper.    , [place], DO 08/08/2012 5:21 PM

## 2012-08-09 LAB — AMYLASE: Amylase: 56 U/L (ref 0–105)

## 2012-08-09 LAB — COMPREHENSIVE METABOLIC PANEL
CO2: 28 mEq/L (ref 19–32)
Creat: 1.28 mg/dL (ref 0.50–1.35)
Glucose, Bld: 73 mg/dL (ref 70–99)
Total Bilirubin: 0.4 mg/dL (ref 0.3–1.2)

## 2012-08-09 LAB — COMPREHENSIVE METABOLIC PANEL WITH GFR
ALT: 8 U/L (ref 0–53)
AST: 10 U/L (ref 0–37)
Albumin: 3.8 g/dL (ref 3.5–5.2)
Alkaline Phosphatase: 95 U/L (ref 39–117)
BUN: 9 mg/dL (ref 6–23)
Calcium: 8.9 mg/dL (ref 8.4–10.5)
Chloride: 105 meq/L (ref 96–112)
Potassium: 4.2 meq/L (ref 3.5–5.3)
Sodium: 141 meq/L (ref 135–145)
Total Protein: 6.2 g/dL (ref 6.0–8.3)

## 2012-08-09 LAB — LIPASE: Lipase: 14 U/L (ref 0–75)

## 2012-08-10 ENCOUNTER — Other Ambulatory Visit: Payer: Self-pay | Admitting: Radiology

## 2012-08-10 ENCOUNTER — Telehealth: Payer: Self-pay

## 2012-08-10 DIAGNOSIS — R197 Diarrhea, unspecified: Secondary | ICD-10-CM

## 2012-08-10 NOTE — Telephone Encounter (Signed)
Pt's mother given labs and xray report. Please call mom when stool tests come back

## 2012-08-11 ENCOUNTER — Telehealth: Payer: Self-pay | Admitting: Radiology

## 2012-08-11 ENCOUNTER — Telehealth: Payer: Self-pay | Admitting: Family Medicine

## 2012-08-11 LAB — OVA AND PARASITE SCREEN: OP: NONE SEEN

## 2012-08-11 LAB — HELICOBACTER PYLORI  ANTIBODY, IGM: Helicobacter pylori, IgM: 0.2 U/mL (ref <9.0)

## 2012-08-11 LAB — HELICOBACTER PYLORI ABS-IGG+IGA, BLD
H Pylori IgG: 0.54 {ISR}
HELICOBACTER PYLORI AB, IGA: 1.6 U/mL (ref <9.0)

## 2012-08-11 LAB — CLOSTRIDIUM DIFFICILE EIA: CDIFTX: NEGATIVE

## 2012-08-11 NOTE — Telephone Encounter (Signed)
Message copied by Candice Camp on Wed Aug 11, 2012  3:33 PM ------      Message from: Rikki Spearing P      Created: Wed Aug 11, 2012  1:53 PM      Regarding: Fax to [facility] Records       Can you fax my office visit note, telephone note and also all the lab results to the following fax #. They have an appt tomorrow at 10 AM with GI doctor.             The phone and fax numbers for records to be sent are:             Attn to [facility] PA-C/ Regino Bellow MD      phone [phone]      fax [phone].                   Thanks,      Dr. Marin Comment

## 2012-08-11 NOTE — Telephone Encounter (Signed)
Spoke with mom about lab results. C. Diff not back yet. It was apparently ordered wrong and that was not conveyed to [place] until this morning 08/11/2012. I had to call [facility] to get that information. The [facility] rep states that C. Diff only processed at night so will be done at 4 pm today and there is a 24 hour turn over time. I have spoken to [place] mom about this. She has asked [place] to fax records to [facility] office since they have an appt with his PA Lawrence Marseilles tomorrow at 10 am. Also they have requested that thhey get the CT scan done at [facility] so that Dr. Vance Peper can see it through their system. The phone and fax numbers for records to be sent are: [phone]/ fax [phone].

## 2012-08-11 NOTE — Telephone Encounter (Signed)
done

## 2012-08-12 ENCOUNTER — Telehealth: Payer: Self-pay | Admitting: Radiology

## 2012-08-12 NOTE — Telephone Encounter (Signed)
Message copied by Candice Camp on Thu Aug 12, 2012 10:25 AM ------      Message from: Rikki Spearing P      Created: Thu Aug 12, 2012  7:38 AM       There appt is today at 10 AM            Please  fax C diff results to:            The phone and fax numbers for records to be sent are:                         Attn to [facility] PA-C/ Regino Bellow MD           phone [phone]           fax [phone].

## 2012-08-12 NOTE — Telephone Encounter (Signed)
Thanks, this is faxed to them

## 2012-08-13 ENCOUNTER — Ambulatory Visit (INDEPENDENT_AMBULATORY_CARE_PROVIDER_SITE_OTHER): Payer: BC Managed Care – PPO | Admitting: Family Medicine

## 2012-08-13 ENCOUNTER — Telehealth: Payer: Self-pay | Admitting: Radiology

## 2012-08-13 ENCOUNTER — Other Ambulatory Visit: Payer: Self-pay

## 2012-08-13 DIAGNOSIS — Z111 Encounter for screening for respiratory tuberculosis: Secondary | ICD-10-CM

## 2012-08-13 LAB — HEPATITIS B SURFACE ANTIGEN: Hepatitis B Surface Ag: NEGATIVE

## 2012-08-13 LAB — STOOL CULTURE

## 2012-08-13 NOTE — Progress Notes (Signed)
Pt here for a tb test.

## 2012-08-13 NOTE — Progress Notes (Signed)
Here for TB test, patient has crohn's starting Humira.

## 2012-08-13 NOTE — Telephone Encounter (Signed)
Lab added on Hep B ag at [facility]. Called mother and informed her that she would need to bring the pt in for PPD placement. She understood and will bringing him in.   LAB: please fax the results after Dr Gus Puma review of TB and Bloodwork to Dr Breck Coons, NP Fax # [phone].

## 2012-08-14 ENCOUNTER — Telehealth: Payer: Self-pay

## 2012-08-14 NOTE — Telephone Encounter (Signed)
Message copied by Constance Goltz on Sat Aug 14, 2012  9:21 AM ------      Message from: Glenford Bayley      Created: Sat Aug 14, 2012  7:59 AM       Please call mom to let her know that final results of stool culture are negative, his hepatitis B surface antigen was alos negative. If she wants she can pick up copy of results when they return to get the TB read or we can just fax it over to Dr. Seward Meth office attn his PA Lawrence Marseilles.            The doctor's  Contact info is:       Phone [phone]      Fax [phone]            Thx      T Le.   ------

## 2012-08-14 NOTE — Telephone Encounter (Signed)
Message copied by Constance Goltz on Sat Aug 14, 2012  9:22 AM ------      Message from: Glenford Bayley      Created: Sat Aug 14, 2012  7:59 AM       Please call mom to let her know that final results of stool culture are negative, his hepatitis B surface antigen was alos negative. If she wants she can pick up copy of results when they return to get the TB read or we can just fax it over to Dr. Seward Meth office attn his PA Lawrence Marseilles.            The doctor's  Contact info is:       Phone [phone]      Fax [phone]            Thx      T Le.   ------

## 2012-08-14 NOTE — Telephone Encounter (Signed)
Pt's mom notified. Left copy up front for her to p/u tomorrow

## 2012-08-15 ENCOUNTER — Ambulatory Visit (INDEPENDENT_AMBULATORY_CARE_PROVIDER_SITE_OTHER): Payer: BC Managed Care – PPO

## 2012-08-15 DIAGNOSIS — Z111 Encounter for screening for respiratory tuberculosis: Secondary | ICD-10-CM

## 2012-08-15 LAB — TB SKIN TEST
Induration: 0 mm
TB Skin Test: NEGATIVE

## 2013-03-17 IMAGING — CR DG ABDOMEN ACUTE W/ 1V CHEST
3 series · 3 of 3 positions shown · non-contrast
Comparison: 07/24/2011 radiograph, [HOSPITAL].

CLINICAL DATA: Bloody diarrhea.  Crohn's disease.  Stomach pain.

ACUTE ABDOMEN SERIES (ABDOMEN 2 VIEW & CHEST 1 VIEW)

[PA]
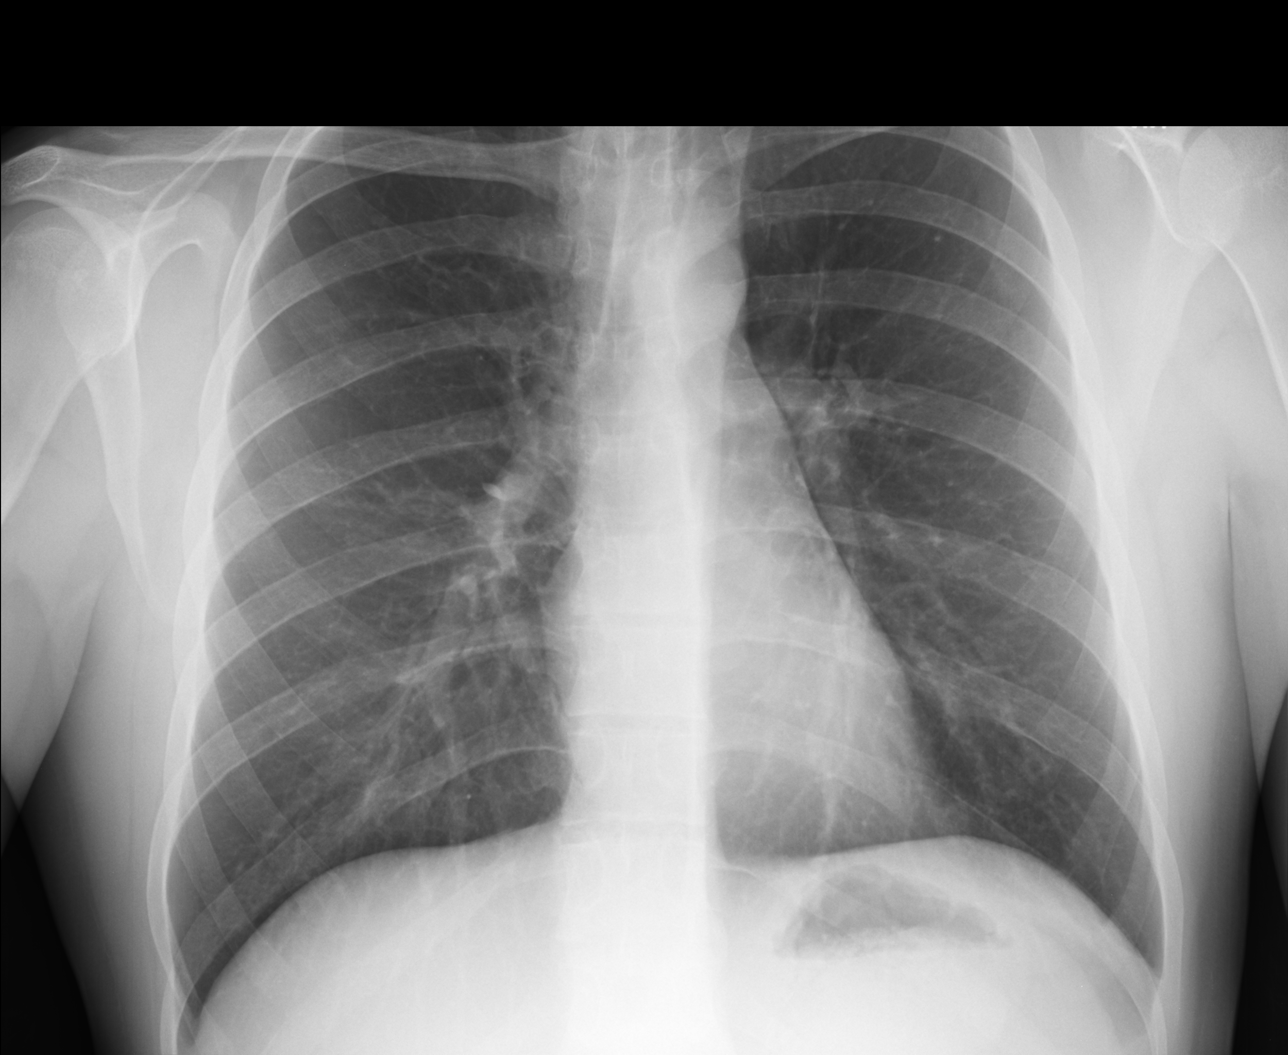

[AP (1 of 2)]
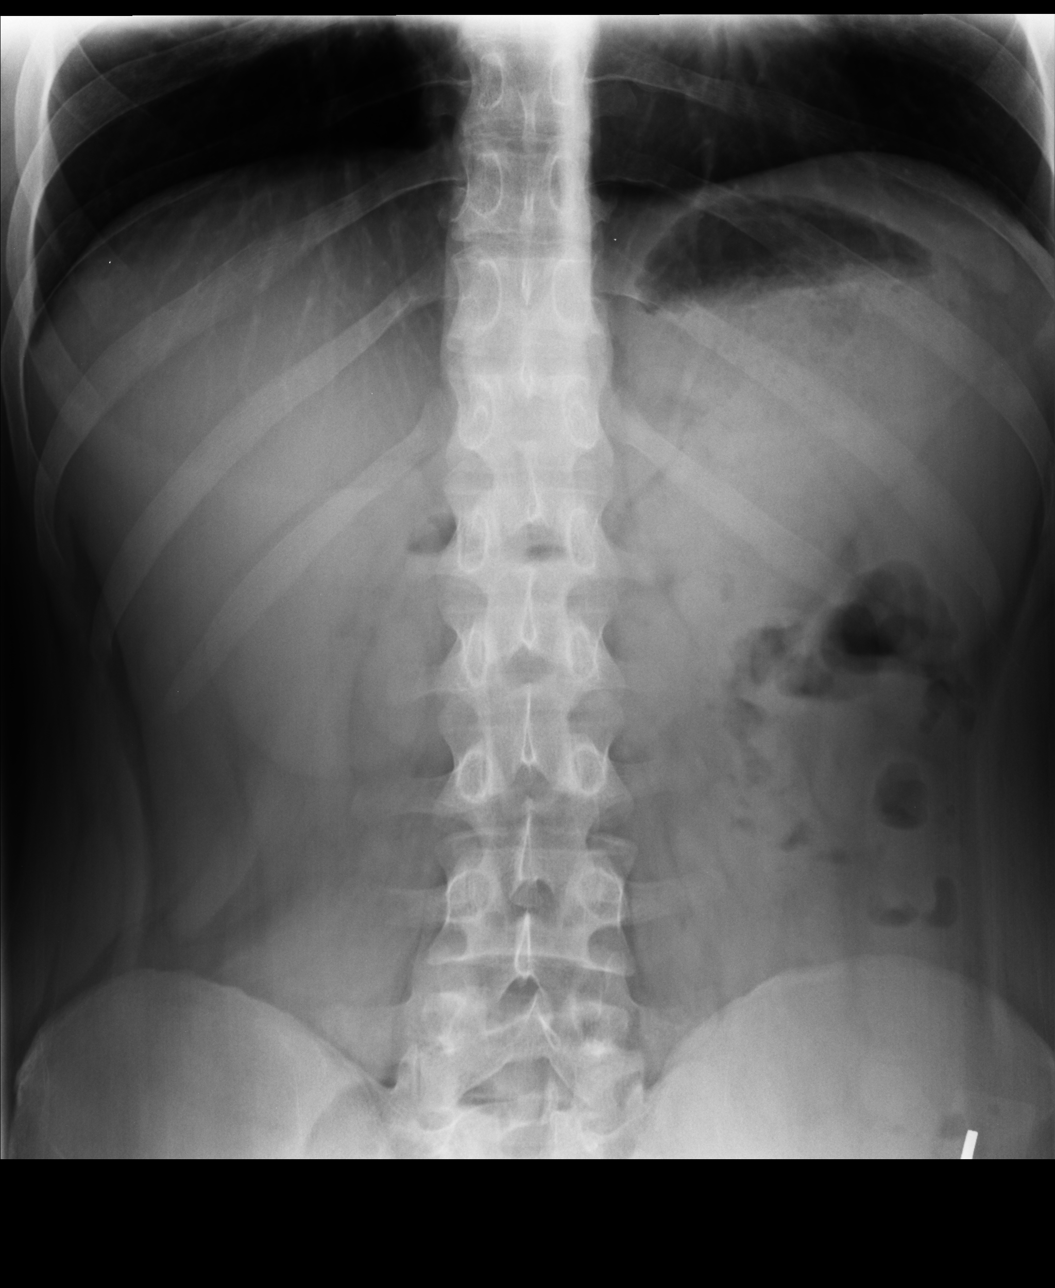

[AP (2 of 2)]
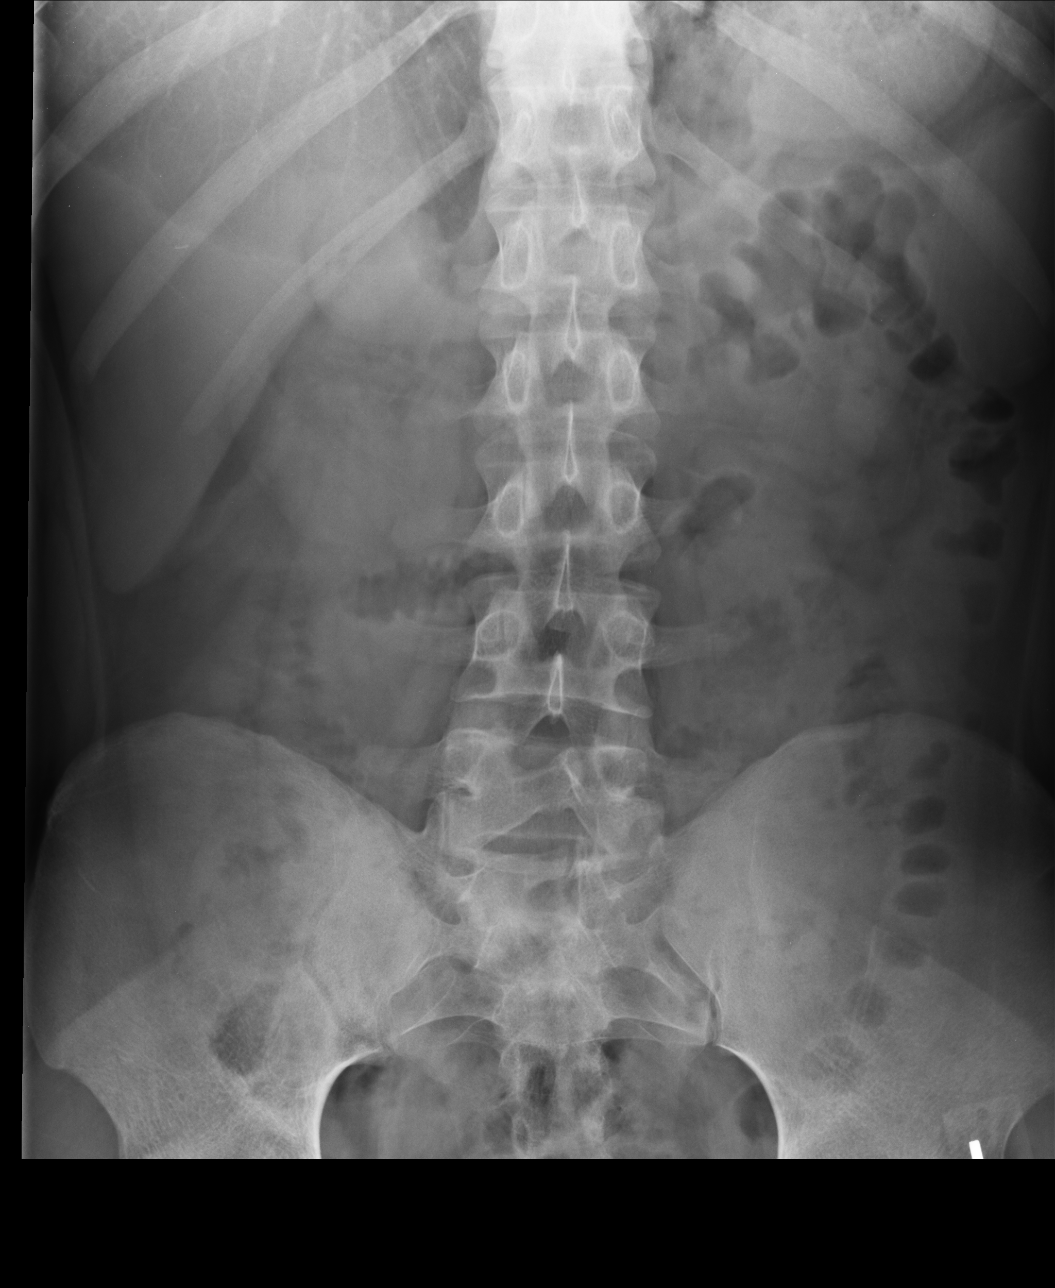

[3 of 3 positions shown; findings below may reference images not displayed]

FINDINGS: Lungs clear.  Cardiopericardial silhouette within normal
limits.  Trachea midline.  No airspace disease or effusion. Bowel
gas pattern is within normal limits.  No pathologic air fluid
levels are identified.   Stool and bowel gas present in the
rectosigmoid.
IMPRESSION: Normal acute abdominal series.

## 2013-11-10 DIAGNOSIS — D509 Iron deficiency anemia, unspecified: Secondary | ICD-10-CM | POA: Insufficient documentation

## 2015-01-27 ENCOUNTER — Emergency Department (HOSPITAL_COMMUNITY)
Admission: EM | Admit: 2015-01-27 | Discharge: 2015-01-28 | Disposition: A | Discharge status: Home / Self Care | Payer: BLUE CROSS/BLUE SHIELD | Attending: Emergency Medicine | Admitting: Emergency Medicine

## 2015-01-27 ENCOUNTER — Encounter (HOSPITAL_COMMUNITY): Payer: Self-pay | Admitting: Emergency Medicine

## 2015-01-27 DIAGNOSIS — Z8719 Personal history of other diseases of the digestive system: Secondary | ICD-10-CM | POA: Insufficient documentation

## 2015-01-27 DIAGNOSIS — F29 Unspecified psychosis not due to a substance or known physiological condition: Secondary | ICD-10-CM | POA: Insufficient documentation

## 2015-01-27 DIAGNOSIS — J45909 Unspecified asthma, uncomplicated: Secondary | ICD-10-CM | POA: Insufficient documentation

## 2015-01-27 DIAGNOSIS — Z79899 Other long term (current) drug therapy: Secondary | ICD-10-CM | POA: Diagnosis not present

## 2015-01-27 DIAGNOSIS — Z046 Encounter for general psychiatric examination, requested by authority: Secondary | ICD-10-CM | POA: Diagnosis present

## 2015-01-27 DIAGNOSIS — Z720 Tobacco use: Secondary | ICD-10-CM | POA: Insufficient documentation

## 2015-01-27 DIAGNOSIS — F319 Bipolar disorder, unspecified: Secondary | ICD-10-CM | POA: Insufficient documentation

## 2015-01-27 DIAGNOSIS — F209 Schizophrenia, unspecified: Secondary | ICD-10-CM | POA: Diagnosis not present

## 2015-01-27 DIAGNOSIS — F250 Schizoaffective disorder, bipolar type: Secondary | ICD-10-CM | POA: Diagnosis present

## 2015-01-27 LAB — COMPREHENSIVE METABOLIC PANEL
ALT: 22 U/L (ref 17–63)
AST: 29 U/L (ref 15–41)
Albumin: 4.5 g/dL (ref 3.5–5.0)
Alkaline Phosphatase: 68 U/L (ref 38–126)
Anion gap: 10 (ref 5–15)
BUN: 13 mg/dL (ref 6–20)
CO2: 26 mmol/L (ref 22–32)
Calcium: 9.2 mg/dL (ref 8.9–10.3)
Chloride: 104 mmol/L (ref 101–111)
Creatinine, Ser: 1 mg/dL (ref 0.61–1.24)
GFR calc Af Amer: >60 mL/min (ref >60)
GFR calc non Af Amer: >60 mL/min (ref >60)
Glucose, Bld: 98 mg/dL (ref 65–99)
Potassium: 4.3 mmol/L (ref 3.5–5.1)
SODIUM: 140 mmol/L (ref 135–145)
Total Bilirubin: 0.6 mg/dL (ref 0.3–1.2)
Total Protein: 7.2 g/dL (ref 6.5–8.1)

## 2015-01-27 LAB — CBC
HCT: 48 % (ref 39.0–52.0)
Hemoglobin: 16.6 g/dL (ref 13.0–17.0)
MCH: 29.7 pg (ref 26.0–34.0)
MCHC: 34.6 g/dL (ref 30.0–36.0)
MCV: 86 fL (ref 78.0–100.0)
PLATELETS: 303 10*3/uL (ref 150–400)
RBC: 5.58 MIL/uL (ref 4.22–5.81)
RDW: 13.8 % (ref 11.5–15.5)
WBC: 10.2 10*3/uL (ref 4.0–10.5)

## 2015-01-27 LAB — RAPID URINE DRUG SCREEN, HOSP PERFORMED
AMPHETAMINES: NOT DETECTED
BARBITURATES: NOT DETECTED
Benzodiazepines: NOT DETECTED
Cocaine: NOT DETECTED
Opiates: NOT DETECTED
TETRAHYDROCANNABINOL: NOT DETECTED

## 2015-01-27 LAB — ETHANOL: Alcohol, Ethyl (B): <5 mg/dL (ref <5)

## 2015-01-27 LAB — ACETAMINOPHEN LEVEL

## 2015-01-27 LAB — SALICYLATE LEVEL

## 2015-01-27 MED ORDER — VITAMIN D3 25 MCG (1000 UNIT) PO TABS
5000.0000 [IU] | ORAL_TABLET | Freq: Every day | ORAL | Status: DC
  Administered 2015-01-27 – 2015-01-28 (×2): 5000 [IU] via ORAL
  Filled 2015-01-27 (×2): qty 5

## 2015-01-27 MED ORDER — VITAMIN B-12 1000 MCG PO TABS
2500.0000 ug | ORAL_TABLET | Freq: Every day | ORAL | Status: DC
  Administered 2015-01-27 – 2015-01-28 (×2): 2500 ug via ORAL
  Filled 2015-01-27 (×2): qty 1

## 2015-01-27 MED ORDER — LAMOTRIGINE 100 MG PO TABS
100.0000 mg | ORAL_TABLET | Freq: Two times a day (BID) | ORAL | Status: DC
  Administered 2015-01-27 – 2015-01-28 (×2): 100 mg via ORAL
  Filled 2015-01-27 (×3): qty 1

## 2015-01-27 MED ORDER — ARIPIPRAZOLE 10 MG PO TABS
10.0000 mg | ORAL_TABLET | Freq: Every day | ORAL | Status: DC
  Filled 2015-01-27 (×2): qty 1

## 2015-01-27 MED ORDER — LORAZEPAM 1 MG PO TABS
2.0000 mg | ORAL_TABLET | Freq: Once | ORAL | Status: AC
  Administered 2015-01-27: 2 mg via ORAL
  Filled 2015-01-27: qty 2

## 2015-01-27 MED ORDER — HYDROXYZINE PAMOATE 25 MG PO CAPS
25.0000 mg | ORAL_CAPSULE | Freq: Three times a day (TID) | ORAL | Status: DC | PRN
  Filled 2015-01-27: qty 1

## 2015-01-27 MED ORDER — AZATHIOPRINE 50 MG PO TABS
100.0000 mg | ORAL_TABLET | Freq: Every day | ORAL | Status: DC
  Administered 2015-01-27 – 2015-01-28 (×2): 100 mg via ORAL
  Filled 2015-01-27 (×2): qty 2

## 2015-01-27 MED ORDER — CITALOPRAM HYDROBROMIDE 20 MG PO TABS
20.0000 mg | ORAL_TABLET | Freq: Every day | ORAL | Status: DC
  Administered 2015-01-27: 20 mg via ORAL
  Filled 2015-01-27: qty 1

## 2015-01-27 MED ORDER — FOLIC ACID 1 MG PO TABS
1.0000 mg | ORAL_TABLET | Freq: Every day | ORAL | Status: DC
  Administered 2015-01-27 – 2015-01-28 (×2): 1 mg via ORAL
  Filled 2015-01-27 (×2): qty 1

## 2015-01-27 MED ORDER — ADULT MULTIVITAMIN W/MINERALS CH
1.0000 | ORAL_TABLET | Freq: Every day | ORAL | Status: DC
  Administered 2015-01-27 – 2015-01-28 (×2): 1 via ORAL
  Filled 2015-01-27 (×2): qty 1

## 2015-01-27 MED ORDER — CLONAZEPAM 0.5 MG PO TABS
1.0000 mg | ORAL_TABLET | Freq: Two times a day (BID) | ORAL | Status: DC | PRN
  Administered 2015-01-27: 1 mg via ORAL
  Filled 2015-01-27 (×4): qty 2

## 2015-01-27 MED ORDER — OLANZAPINE 10 MG PO TBDP
10.0000 mg | ORAL_TABLET | Freq: Three times a day (TID) | ORAL | Status: DC | PRN
  Administered 2015-01-27 – 2015-01-28 (×2): 10 mg via ORAL
  Filled 2015-01-27 (×2): qty 1
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
## 2015-01-27 NOTE — ED Notes (Signed)
Patient and belongings have been wanded by security

## 2015-01-27 NOTE — ED Notes (Signed)
TTS at chair side
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
## 2015-01-27 NOTE — ED Provider Notes (Signed)
CSN: [id number]   Arrival date & time 01/27/15 1631  History  This chart was scribed for  Tanna Furry, MD by Altamease Oiler, ED Scribe. This patient was seen in room WTR3/WLPT3 and the patient's care was started at 4:57 PM.  Chief Complaint  Patient presents with  . Medical Clearance  . Delusional    HPI The history is provided by the patient. No language interpreter was used.   Seth Fields is a 24 y.o. male with PMHx of schizophrenia and bipolar affective disorder (diagnosed 5 years ago)  who presents to the Emergency Department complaining of increasing auditory hallucinations with onset 2 months ago. The voices say " I'm God and you should get out of where you are, I'm [place] and you should get out of where you are". Associated symptoms include agitation.  He goes to [employer] and wants to be "separated" from his parents because he thinks they are "kingpins".  His symptoms are sometimes controlled by a shot once per month and clonazepam. Pt uses a "good amount " of alcohol (last drink was 1.5 weeks ago), last smoked cocaine 2 months ago, and used [place] 5 days ago.   Past Medical History  Diagnosis Date  . Asthma     Pt reports this is resolved  . Bipolar affective   . Schizo affective schizophrenia   . Crohn's disease   . Gluten intolerance     Past Surgical History  Procedure Laterality Date  . Abdominal surgery    . Small intestine surgery      History reviewed. No pertinent family history.  History  Substance Use Topics  . Smoking status: Current Every Day Smoker -- 0.20 packs/day for 5 years    Types: Cigarettes  . Smokeless tobacco: Never Used  . Alcohol Use: No     Review of Systems  Constitutional: Negative for fever, chills, diaphoresis, appetite change and fatigue.  HENT: Negative for mouth sores, sore throat and trouble swallowing.   Eyes: Negative for visual disturbance.  Respiratory: Negative for cough, chest tightness, shortness of breath  and wheezing.   Cardiovascular: Negative for chest pain.  Gastrointestinal: Negative for nausea, vomiting, abdominal pain, diarrhea and abdominal distention.  Endocrine: Negative for polydipsia, polyphagia and polyuria.  Genitourinary: Negative for dysuria, frequency and hematuria.  Musculoskeletal: Negative for gait problem.  Skin: Negative for color change, pallor and rash.  Neurological: Negative for dizziness, syncope, light-headedness and headaches.  Hematological: Does not bruise/bleed easily.  Psychiatric/Behavioral: Positive for hallucinations and agitation. Negative for behavioral problems and confusion.     Home Medications   Prior to Admission medications   Medication Sig Start Date End Date Taking? Authorizing Provider  ARIPiprazole (ABILIFY MAINTENA) 300 MG SUSR Inject 300 mg into the muscle every 28 (twenty-eight) days. Last injection 08/03/2012    Historical Provider, MD  azaTHIOprine (IMURAN) 50 MG tablet Take 100 mg by mouth daily. Cont home med 07/21/11   Historical Provider, MD  cholecalciferol (VITAMIN D) 1000 UNITS tablet Take 5,000 Units by mouth daily.     Historical Provider, MD  citalopram (CELEXA) 20 MG tablet Take 20 mg by mouth daily.    Historical Provider, MD  folic acid (FOLVITE) 1 MG tablet Take 1 mg by mouth daily.      Historical Provider, MD  hydrOXYzine (VISTARIL) 25 MG capsule Take 25 mg by mouth 3 (three) times daily as needed.    Historical Provider, MD  methylphenidate [facility]) 10 mg/9hr Place 1 patch onto  the skin daily. wear patch for 9 hours only each day    Historical Provider, MD  Multiple Vitamins-Minerals (MULTIVITAMINS THER. W/MINERALS) TABS Take 1 tablet by mouth daily.      Historical Provider, MD  vitamin B-12 (CYANOCOBALAMIN) 1000 MCG tablet Take 2,500 mcg by mouth daily.     Historical Provider, MD    Allergies  Cholestatin and Gluten  Triage Vitals: BP 124/63 mmHg  Pulse 108  Temp(Src) 98.7 F (37.1 C) (Oral)  Resp 19  SpO2  96%  Physical Exam  Constitutional: He is oriented to person, place, and time. He appears well-developed and well-nourished. No distress.  HENT:  Head: Normocephalic.  Eyes: Conjunctivae are normal. Pupils are equal, round, and reactive to light. No scleral icterus.  Neck: Normal range of motion. Neck supple. No thyromegaly present.  Cardiovascular: Normal rate and regular rhythm.  Exam reveals no gallop and no friction rub.   No murmur heard. Pulmonary/Chest: Effort normal and breath sounds normal. No respiratory distress. He has no wheezes. He has no rales.  Abdominal: Soft. Bowel sounds are normal. He exhibits no distension. There is no tenderness. There is no rebound.  Musculoskeletal: Normal range of motion.  Neurological: He is alert and oriented to person, place, and time.  Skin: Skin is warm and dry. No rash noted.  Psychiatric: He has a normal mood and affect. His behavior is normal. His speech is rapid and/or pressured and tangential. Thought content is delusional.  Nursing note and vitals reviewed.   ED Course  Procedures   DIAGNOSTIC STUDIES: Oxygen Saturation is 96% on RA, normal by my interpretation.    COORDINATION OF CARE: 4:51 PM Discussed treatment plan which includes lab work and Ativan with pt at bedside and pt agreed to plan.  Labs Review-  Labs Reviewed  COMPREHENSIVE METABOLIC PANEL  ETHANOL  CBC  URINE RAPID DRUG SCREEN, HOSP PERFORMED  ACETAMINOPHEN LEVEL  SALICYLATE LEVEL    Imaging Review No results found.  EKG Interpretation None      MDM   Final diagnoses:  Psychosis, unspecified psychosis type     I personally performed the services described in this documentation, which was scribed in my presence. The recorded information has been reviewed and is accurate.    Tanna Furry, MD 01/27/15 [id number]
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
## 2015-01-28 DIAGNOSIS — F29 Unspecified psychosis not due to a substance or known physiological condition: Secondary | ICD-10-CM

## 2015-01-28 DIAGNOSIS — F250 Schizoaffective disorder, bipolar type: Secondary | ICD-10-CM | POA: Diagnosis present

## 2015-01-28 MED ORDER — CITALOPRAM HYDROBROMIDE 10 MG PO TABS
10.0000 mg | ORAL_TABLET | Freq: Every day | ORAL | Status: DC
  Administered 2015-01-28: 10 mg via ORAL
  Filled 2015-01-28: qty 1

## 2015-01-28 MED ORDER — ARIPIPRAZOLE 10 MG PO TABS: 10.0000 mg | ORAL_TABLET | Freq: Every day | ORAL | Status: DC

## 2015-01-28 MED ORDER — CITALOPRAM HYDROBROMIDE 10 MG PO TABS: 10.0000 mg | ORAL_TABLET | Freq: Every day | ORAL | Status: DC

## 2015-01-28 MED ORDER — ARIPIPRAZOLE 10 MG PO TABS
10.0000 mg | ORAL_TABLET | Freq: Every day | ORAL | Status: DC
  Filled 2015-01-28: qty 1

## 2015-01-28 NOTE — Progress Notes (Signed)
Disposition CSW completed referrals to the following Psych Facilities:  [facility] will continue to assist with patient's placement needs.  [place] Disposition CSW [phone]

## 2015-01-28 NOTE — Progress Notes (Signed)
Patient's mother, Lelon Frohlich, called to update patient's medications. States the correct dose of Abilify Maintena is 400 mg and patient last received this on 01/09/15. She also clarified patient's Clonazepam dose as 1 mg PO BID prn and she administers this to the patient. She reports the patient's triggers for this episode are taking 2 courses in summer school, one of which is abnormal psych; his brother is getting married; and he has Crohn's disease.   She states he has been under the care of a psychiatrist, Dr. Reece Levy, for the past 5 years. Dr. Reece Levy can be reached at [phone]. She states the patient has been on many atypical antipsychotics in the past.   Serena Colonel, FNP-BC

## 2015-01-28 NOTE — Consult Note (Signed)
[facility] Consult   Reason for Consult:  Psychosis, Paranoia Referring Physician:  EDP Patient Identification: Seth Fields MRN:  [id number] Principal Diagnosis: Psychosis Diagnosis:   Patient Active Problem List   Diagnosis Date Noted  . Psychosis [F29] 01/28/2015    Priority: High  . Crohn disease [K50.90] 01/30/2012    Total Time spent with patient: 45 minutes  Subjective:   Seth Fields is a 24 y.o. male patient admitted with Psychosis, Paranoia   HPI: Caucasian male, 24 years old was evaluated for Paranoia.  Patient was brought in by his family for complain of auditory hallucination which has been going on for 2 months.  He sees Dr Seth Fields and has been on Abilify injection which he received recently.  Today patient reported that he believes there is a Chip planted in his head.  He reports hearing voices telling him to kill himself and people making fun of him.  He believes people are haunting him and want to kill him by cars in [place].  Patient admitted to using Cocaine 2 week ago, drank Alcohol one and half weeks ago and used [place] 5 days ago.  He declined admission to day but will like to seek rehabilitation treatment at a facility in [place].  He denies SI/HI.  We spoke to Dr Seth Fields who agrees that patient can be started on Celexa and oral Abilify and be discharged home to see him.  Patient is discharged home.   HPI Elements:   Location:  Paranoia, Psychosis, substance induced Psychosis. Quality:  severe. Severity:  severe. Timing:  Acute. Duration:  Chronic mental illness. Context:  Brought in by family for [place]..  Past Medical History:  Past Medical History  Diagnosis Date  . Asthma     Pt reports this is resolved  . Bipolar affective   . Schizo affective schizophrenia   . Crohn's disease   . Gluten intolerance     Past Surgical History  Procedure Laterality Date  . Abdominal surgery    . Small intestine surgery     Family History:  History reviewed. No pertinent family history. Social History:  History  Alcohol Use No    Comment: Patient denies      History  Drug Use  . Yes  . Special: MDMA (Ecstacy), Cocaine    Comment: Patient reports he uses "molly" and cocaine.     History   Social History  . Marital Status: Single    Spouse Name: N/A  . Number of Children: N/A  . Years of Education: N/A   Social History Main Topics  . Smoking status: Current Every Day Smoker -- 0.20 packs/day for 5 years    Types: Cigarettes  . Smokeless tobacco: Never Used  . Alcohol Use: No     Comment: Patient denies   . Drug Use: Yes    Special: MDMA (Ecstacy), Cocaine     Comment: Patient reports he uses "molly" and cocaine.   . Sexual Activity: Not on file   Other Topics Concern  . None   Social History Narrative   Additional Social History:    History of alcohol / drug use?: Yes                     Allergies:   Allergies  Allergen Reactions  . Cholestatin   . Clozaril [Clozapine]     Muscles stiffened up  . Gluten     Labs:  Results for orders placed or performed  during the hospital encounter of 01/27/15 (from the past 48 hour(s))  Urine rapid drug screen (hosp performed) (Not at [facility])     Status: None   Collection Time: 01/27/15  5:08 PM  Result Value Ref Range   Opiates NONE DETECTED NONE DETECTED   Cocaine NONE DETECTED NONE DETECTED   Benzodiazepines NONE DETECTED NONE DETECTED   Amphetamines NONE DETECTED NONE DETECTED   Tetrahydrocannabinol NONE DETECTED NONE DETECTED   Barbiturates NONE DETECTED NONE DETECTED    Comment:        DRUG SCREEN FOR MEDICAL PURPOSES ONLY.  IF CONFIRMATION IS NEEDED FOR ANY PURPOSE, NOTIFY LAB WITHIN 5 DAYS.        LOWEST DETECTABLE LIMITS FOR URINE DRUG SCREEN Drug Class       Cutoff (ng/mL) Amphetamine      1000 Barbiturate      200 Benzodiazepine   474 Tricyclics       259 Opiates          300 Cocaine          300 THC              50    Comprehensive metabolic panel     Status: None   Collection Time: 01/27/15  5:15 PM  Result Value Ref Range   Sodium 140 135 - 145 mmol/L   Potassium 4.3 3.5 - 5.1 mmol/L   Chloride 104 101 - 111 mmol/L   CO2 26 22 - 32 mmol/L   Glucose, Bld 98 65 - 99 mg/dL   BUN 13 6 - 20 mg/dL   Creatinine, Ser 1.00 0.61 - 1.24 mg/dL   Calcium 9.2 8.9 - 10.3 mg/dL   Total Protein 7.2 6.5 - 8.1 g/dL   Albumin 4.5 3.5 - 5.0 g/dL   AST 29 15 - 41 U/L   ALT 22 17 - 63 U/L   Alkaline Phosphatase 68 38 - 126 U/L   Total Bilirubin 0.6 0.3 - 1.2 mg/dL   GFR calc non Af Amer >60 >60 mL/min   GFR calc Af Amer >60 >60 mL/min    Comment: (NOTE) The eGFR has been calculated using the CKD EPI equation. This calculation has not been validated in all clinical situations. eGFR's persistently <60 mL/min signify possible Chronic Kidney Disease.    Anion gap 10 5 - 15  Ethanol (ETOH)     Status: None   Collection Time: 01/27/15  5:15 PM  Result Value Ref Range   Alcohol, Ethyl (B) <5 <5 mg/dL    Comment:        LOWEST DETECTABLE LIMIT FOR SERUM ALCOHOL IS 5 mg/dL FOR MEDICAL PURPOSES ONLY   CBC     Status: None   Collection Time: 01/27/15  5:15 PM  Result Value Ref Range   WBC 10.2 4.0 - 10.5 K/uL   RBC 5.58 4.22 - 5.81 MIL/uL   Hemoglobin 16.6 13.0 - 17.0 g/dL   HCT 48.0 39.0 - 52.0 %   MCV 86.0 78.0 - 100.0 fL   MCH 29.7 26.0 - 34.0 pg   MCHC 34.6 30.0 - 36.0 g/dL   RDW 13.8 11.5 - 15.5 %   Platelets 303 150 - 400 K/uL  Acetaminophen level     Status: Abnormal   Collection Time: 01/27/15  5:15 PM  Result Value Ref Range   Acetaminophen (Tylenol), Serum <10 (L) 10 - 30 ug/mL    Comment:        THERAPEUTIC CONCENTRATIONS VARY SIGNIFICANTLY. A  RANGE OF 10-30 ug/mL MAY BE AN EFFECTIVE CONCENTRATION FOR MANY PATIENTS. HOWEVER, SOME ARE BEST TREATED AT CONCENTRATIONS OUTSIDE THIS RANGE. ACETAMINOPHEN CONCENTRATIONS >150 ug/mL AT 4 HOURS AFTER INGESTION AND >50 ug/mL AT 12 HOURS AFTER  INGESTION ARE OFTEN ASSOCIATED WITH TOXIC REACTIONS.   Salicylate level     Status: None   Collection Time: 01/27/15  5:15 PM  Result Value Ref Range   Salicylate Lvl <4.4 2.8 - 30.0 mg/dL    Vitals: Blood pressure 108/55, pulse 60, temperature 97.6 F (36.4 C), temperature source Oral, resp. rate 20, SpO2 97 %.  Risk to Self: Suicidal Ideation: No Suicidal Intent: No Is patient at risk for suicide?: No Suicidal Plan?: No Access to Means: No What has been your use of drugs/alcohol within the last 12 months?: Molly and Cocaine per patient How many times?: 0 Triggers for Past Attempts: None known Intentional Self Injurious Behavior: None Risk to Others: Homicidal Ideation: No Thoughts of Harm to Others: No Current Homicidal Intent: No Current Homicidal Plan: No Access to Homicidal Means: No Identified Victim: None History of harm to others?: No Assessment of Violence: None Noted Violent Behavior Description: Pt is calm and cooperative Does patient have access to weapons?: No Criminal Charges Pending?: No Does patient have a court date: No Prior Inpatient Therapy: Prior Inpatient Therapy:  (Unknown. Pt would not disclose) Prior Outpatient Therapy: Prior Outpatient Therapy:  (Unknown. Patient would not disclose)  Current Facility-Administered Medications  Medication Dose Route Frequency Provider Last Rate Last Dose  . ARIPiprazole (ABILIFY) tablet 10 mg  10 mg Oral QHS Delfin Gant, NP      . azaTHIOprine (IMURAN) tablet 100 mg  100 mg Oral Daily Tanna Furry, MD   100 mg at 01/28/15 0102  . cholecalciferol (VITAMIN D) tablet 5,000 Units  5,000 Units Oral Daily Tanna Furry, MD   5,000 Units at 01/28/15 [phone]  . citalopram (CELEXA) tablet 10 mg  10 mg Oral Daily Delfin Gant, NP      . clonazePAM (KLONOPIN) tablet 1 mg  1 mg Oral BID PRN Lurena Nida, NP   1 mg at 01/27/15 2221  . cyanocobalamin tablet 2,500 mcg  2,500 mcg Oral Daily Tanna Furry, MD   2,500 mcg at  01/28/15 [phone]  . folic acid (FOLVITE) tablet 1 mg  1 mg Oral Daily Tanna Furry, MD   1 mg at 01/28/15 0347  . hydrOXYzine (VISTARIL) capsule 25 mg  25 mg Oral TID PRN Tanna Furry, MD      . lamoTRIgine (LAMICTAL) tablet 100 mg  100 mg Oral BID Lurena Nida, NP   100 mg at 01/28/15 4259  . multivitamin with minerals tablet 1 tablet  1 tablet Oral Daily Tanna Furry, MD   1 tablet at 01/28/15 [phone]  . OLANZapine zydis (ZYPREXA) disintegrating tablet 10 mg  10 mg Oral Q8H PRN Delfin Gant, NP   10 mg at 01/28/15 7564   Current Outpatient Prescriptions  Medication Sig Dispense Refill  . Adalimumab (HUMIRA) 40 MG/0.8ML PSKT Inject 40 mg into the skin every 14 (fourteen) days.    . ARIPiprazole (ABILIFY MAINTENA) 400 MG SUSR Inject 400 mg into the muscle every 28 (twenty-eight) days.    Marland Kitchen azaTHIOprine (IMURAN) 50 MG tablet Take 100 mg by mouth daily.     . B Complex-Biotin-FA (B-50 COMPLEX PO) Take 1 capsule by mouth daily.    . cholecalciferol (VITAMIN D) 1000 UNITS tablet Take 5,000 Units by mouth daily.     Marland Kitchen  clonazePAM (KLONOPIN) 0.5 MG tablet Take 0.5 mg by mouth 2 (two) times daily as needed for anxiety.    . clonazePAM (KLONOPIN) 1 MG tablet Take 1 mg by mouth 2 (two) times daily as needed for anxiety.    . ferrous sulfate 325 (65 FE) MG tablet Take 325 mg by mouth daily with breakfast.    . folic acid (FOLVITE) 1 MG tablet Take 1 mg by mouth daily.      . hydrOXYzine (VISTARIL) 25 MG capsule Take 25 mg by mouth 3 (three) times daily as needed for anxiety.     . LamoTRIgine XR 200 MG TB24 Take 1 tablet by mouth at bedtime.     . Multiple Vitamins-Minerals (MULTIVITAMINS THER. W/MINERALS) TABS Take 1 tablet by mouth daily.      . Omega-3 Fatty Acids (FISH OIL) 1000 MG CAPS Take 1 capsule by mouth daily.    . ARIPiprazole (ABILIFY) 10 MG tablet Take 1 tablet (10 mg total) by mouth at bedtime. 10 tablet 0  . citalopram (CELEXA) 10 MG tablet Take 1 tablet (10 mg total) by mouth daily. 10 tablet  0    Musculoskeletal: Strength & Muscle Tone: within normal limits Gait & Station: normal Patient leans: N/A  Psychiatric Specialty Exam: Physical Exam  Review of Systems  Constitutional: Negative.   HENT: Negative.   Eyes: Negative.   Respiratory: Negative.   Cardiovascular: Negative.   Gastrointestinal: Negative.        Hx of Crohn disease  Genitourinary: Negative.   Musculoskeletal: Negative.   Skin: Negative.   Neurological: Negative.   Endo/Heme/Allergies: Negative.     Blood pressure 108/55, pulse 60, temperature 97.6 F (36.4 C), temperature source Oral, resp. rate 20, SpO2 97 %.There is no weight on file to calculate BMI.  General Appearance: Casual  Eye Contact::  Good  Speech:  Clear and Coherent and Normal Rate  Volume:  Normal  Mood:  Anxious and Depressed  Affect:  Congruent and Depressed  Thought Process:  Coherent, Goal Directed and Intact  Orientation:  Full (Time, Place, and Person)  Thought Content:  Paranoid Ideation  Suicidal Thoughts:  No  Homicidal Thoughts:  No  Memory:  Immediate;   Good Recent;   Good Remote;   Good  Judgement:  Poor  Insight:  Good  Psychomotor Activity:  Normal  Concentration:  Fair  Recall:  NA  Fund of Knowledge:Fair  Language: Good  Akathisia:  NA  Handed:  Right  AIMS (if indicated):     Assets:  Desire for Improvement  ADL's:  Intact  Cognition: WNL  Sleep:      Medical Decision Making: Established Problem, Stable/Improving (1)   Plan:  Discharge home  Disposition: Discharge home with outpatient medication management  Delfin Gant   PMHNP-BC  01/28/2015 2:19 PM  Patient seen face-to-face for psychiatric evaluation along with psychiatric nurse practitioner and case discussed with the treatment team and also with the patient mother who he has a medical care power of attorney and patient primary psychiatrist Dr. Reece Fields. Formulated treatment plan including adding new medication Celexa 10 mg daily for  depression and Abilify 10 mg at bedtime for controlling auditory hallucinations. Patient has no safety concerns during this evaluation and reviewed the information documented and agree with the treatment plan.  Jeremy Ditullio,JANARDHAHA R. 01/28/2015 7:37 PM

## 2015-02-05 ENCOUNTER — Encounter (HOSPITAL_COMMUNITY): Payer: Self-pay | Admitting: Emergency Medicine

## 2015-02-05 ENCOUNTER — Emergency Department (HOSPITAL_COMMUNITY)
Admission: EM | Admit: 2015-02-05 | Discharge: 2015-02-06 | Disposition: A | Discharge status: Home / Self Care | Payer: BLUE CROSS/BLUE SHIELD | Attending: Emergency Medicine | Admitting: Emergency Medicine

## 2015-02-05 DIAGNOSIS — F1990 Other psychoactive substance use, unspecified, uncomplicated: Secondary | ICD-10-CM

## 2015-02-05 DIAGNOSIS — R440 Auditory hallucinations: Secondary | ICD-10-CM | POA: Diagnosis present

## 2015-02-05 DIAGNOSIS — F29 Unspecified psychosis not due to a substance or known physiological condition: Secondary | ICD-10-CM | POA: Diagnosis not present

## 2015-02-05 DIAGNOSIS — J45909 Unspecified asthma, uncomplicated: Secondary | ICD-10-CM | POA: Diagnosis not present

## 2015-02-05 DIAGNOSIS — F209 Schizophrenia, unspecified: Secondary | ICD-10-CM | POA: Insufficient documentation

## 2015-02-05 DIAGNOSIS — F319 Bipolar disorder, unspecified: Secondary | ICD-10-CM | POA: Insufficient documentation

## 2015-02-05 DIAGNOSIS — Z79899 Other long term (current) drug therapy: Secondary | ICD-10-CM | POA: Diagnosis not present

## 2015-02-05 DIAGNOSIS — Z720 Tobacco use: Secondary | ICD-10-CM | POA: Diagnosis not present

## 2015-02-05 DIAGNOSIS — Z8719 Personal history of other diseases of the digestive system: Secondary | ICD-10-CM | POA: Diagnosis not present

## 2015-02-05 LAB — RAPID URINE DRUG SCREEN, HOSP PERFORMED
Amphetamines: NOT DETECTED
Barbiturates: NOT DETECTED
Benzodiazepines: NOT DETECTED
Cocaine: NOT DETECTED
OPIATES: NOT DETECTED
Tetrahydrocannabinol: NOT DETECTED

## 2015-02-05 LAB — ETHANOL: Alcohol, Ethyl (B): 64 mg/dL — ABNORMAL HIGH (ref <5)

## 2015-02-05 MED ORDER — VITAMIN D3 25 MCG (1000 UNIT) PO TABS
5000.0000 [IU] | ORAL_TABLET | Freq: Every day | ORAL | Status: DC
  Administered 2015-02-06: 5000 [IU] via ORAL
  Filled 2015-02-05: qty 5

## 2015-02-05 MED ORDER — ARIPIPRAZOLE ER 400 MG IM SUSR
400.0000 mg | INTRAMUSCULAR | Status: DC
  Filled 2015-02-05: qty 400

## 2015-02-05 MED ORDER — LAMOTRIGINE 100 MG PO TABS
100.0000 mg | ORAL_TABLET | Freq: Two times a day (BID) | ORAL | Status: DC
  Administered 2015-02-05 – 2015-02-06 (×2): 100 mg via ORAL
  Filled 2015-02-05 (×4): qty 1

## 2015-02-05 MED ORDER — FOLIC ACID 1 MG PO TABS
1.0000 mg | ORAL_TABLET | Freq: Every day | ORAL | Status: DC
Start: 2015-02-05 — End: 2015-02-06
  Administered 2015-02-05 – 2015-02-06 (×2): 1 mg via ORAL
  Filled 2015-02-05 (×2): qty 1

## 2015-02-05 MED ORDER — DIPHENHYDRAMINE HCL 25 MG PO CAPS
50.0000 mg | ORAL_CAPSULE | Freq: Once | ORAL | Status: AC
  Administered 2015-02-05: 50 mg via ORAL
  Filled 2015-02-05: qty 2

## 2015-02-05 MED ORDER — ARIPIPRAZOLE 10 MG PO TABS
10.0000 mg | ORAL_TABLET | Freq: Every day | ORAL | Status: DC
  Administered 2015-02-05: 10 mg via ORAL
  Filled 2015-02-05 (×2): qty 1

## 2015-02-05 MED ORDER — LAMOTRIGINE ER 200 MG PO TB24: 1.0000 | ORAL_TABLET | Freq: Every day | ORAL | Status: DC

## 2015-02-05 MED ORDER — CITALOPRAM HYDROBROMIDE 10 MG PO TABS
10.0000 mg | ORAL_TABLET | Freq: Every day | ORAL | Status: DC
  Administered 2015-02-06: 10 mg via ORAL
  Filled 2015-02-05: qty 1

## 2015-02-05 MED ORDER — HYDROXYZINE PAMOATE 25 MG PO CAPS
25.0000 mg | ORAL_CAPSULE | Freq: Three times a day (TID) | ORAL | Status: DC | PRN
  Filled 2015-02-05: qty 1

## 2015-02-05 MED ORDER — CLONAZEPAM 0.5 MG PO TABS
1.0000 mg | ORAL_TABLET | Freq: Two times a day (BID) | ORAL | Status: DC | PRN
Start: 2015-02-05 — End: 2015-02-06
  Administered 2015-02-06: 1 mg via ORAL
  Filled 2015-02-05: qty 2

## 2015-02-05 MED ORDER — LORAZEPAM 1 MG PO TABS
2.0000 mg | ORAL_TABLET | Freq: Once | ORAL | Status: AC
  Administered 2015-02-05: 2 mg via ORAL
  Filled 2015-02-05: qty 2

## 2015-02-05 MED ORDER — OLANZAPINE 10 MG PO TABS
10.0000 mg | ORAL_TABLET | Freq: Once | ORAL | Status: AC
  Administered 2015-02-05: 10 mg via ORAL
  Filled 2015-02-05: qty 1

## 2015-02-05 MED ORDER — AZATHIOPRINE 50 MG PO TABS
100.0000 mg | ORAL_TABLET | Freq: Every day | ORAL | Status: DC
  Administered 2015-02-06: 100 mg via ORAL
  Filled 2015-02-05: qty 2

## 2015-02-05 NOTE — ED Notes (Signed)
Bed: LE75 Expected date:  Expected time:  Means of arrival:  Comments: Hold for triage
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
## 2015-02-05 NOTE — ED Provider Notes (Addendum)
CSN: [id number]     Arrival date & time 02/05/15  1749 History   First MD Initiated Contact with Patient 02/05/15 [place]     Chief Complaint  Patient presents with  . auditory hallucinations      (Consider location/radiation/quality/duration/timing/severity/associated sxs/prior Treatment) Patient is a 24 y.o. male presenting with mental health disorder. The history is provided by the patient.  Mental Health Problem Presenting symptoms: hallucinations, homicidal ideas, suicidal thoughts and suicidal threats   Presenting symptoms: no self mutilation   Patient accompanied by:  Law enforcement Degree of incapacity (severity):  Severe Onset quality:  Sudden Duration:  1 day Timing:  Constant Progression:  Unchanged Chronicity:  Recurrent Context: alcohol use and drug abuse   Treatment compliance:  All of the time Time since last psychoactive medication taken:  1 day Relieved by:  Nothing Worsened by:  Nothing tried Ineffective treatments:  None tried Associated symptoms: poor judgment   Associated symptoms: no abdominal pain, no chest pain and no headaches   Risk factors: hx of mental illness    24 yo M with chief complaints of homicidal ideation. Patient states that he was drinking and doing cocaine and marijuana when he felt that everybody is looking at him funny and he felt like he wanted to kill everyone. He went to the police office because he was worried that he was going to kill somebody and suggested that they bring him here to be placed at [facility].  Patient denies any chest pain shortness breath abdominal pain headaches neck pain.   Past Medical History  Diagnosis Date  . Asthma     Pt reports this is resolved  . Bipolar affective   . Schizo affective schizophrenia   . Crohn's disease   . Gluten intolerance    Past Surgical History  Procedure Laterality Date  . Abdominal surgery    . Small intestine surgery     No family history on file. History  Substance Use  Topics  . Smoking status: Current Every Day Smoker -- 0.20 packs/day for 5 years    Types: Cigarettes  . Smokeless tobacco: Never Used  . Alcohol Use: No     Comment: Patient denies     Review of Systems  Constitutional: Negative for fever and chills.  HENT: Negative for congestion and facial swelling.   Eyes: Negative for discharge and visual disturbance.  Respiratory: Negative for shortness of breath.   Cardiovascular: Negative for chest pain and palpitations.  Gastrointestinal: Negative for vomiting, abdominal pain and diarrhea.  Musculoskeletal: Negative for myalgias and arthralgias.  Skin: Negative for color change and rash.  Neurological: Negative for tremors, syncope and headaches.  Psychiatric/Behavioral: Positive for suicidal ideas, homicidal ideas and hallucinations. Negative for confusion, self-injury and dysphoric mood.      Allergies  Cholestatin; Clozaril; and Gluten  Home Medications   Prior to Admission medications   Medication Sig Start Date End Date Taking? Authorizing Provider  Adalimumab (HUMIRA) 40 MG/0.8ML PSKT Inject 40 mg into the skin every 14 (fourteen) days.   Yes Historical Provider, MD  ARIPiprazole (ABILIFY MAINTENA) 400 MG SUSR Inject 400 mg into the muscle every 28 (twenty-eight) days.   Yes Historical Provider, MD  ARIPiprazole (ABILIFY) 10 MG tablet Take 1 tablet (10 mg total) by mouth at bedtime. 01/28/15  Yes Delfin Gant, NP  azaTHIOprine (IMURAN) 50 MG tablet Take 100 mg by mouth daily.  07/21/11  Yes Historical Provider, MD  B Complex-Biotin-FA (B-50 COMPLEX PO) Take 1  capsule by mouth daily.   Yes Historical Provider, MD  cholecalciferol (VITAMIN D) 1000 UNITS tablet Take 5,000 Units by mouth daily.    Yes Historical Provider, MD  citalopram (CELEXA) 10 MG tablet Take 1 tablet (10 mg total) by mouth daily. 01/28/15  Yes Delfin Gant, NP  folic acid (FOLVITE) 1 MG tablet Take 1 mg by mouth daily.     Yes Historical Provider, MD   hydrOXYzine (VISTARIL) 25 MG capsule Take 25 mg by mouth 3 (three) times daily as needed for anxiety.    Yes Historical Provider, MD  LamoTRIgine XR 200 MG TB24 Take 1 tablet by mouth at bedtime.    Yes Historical Provider, MD  Multiple Vitamins-Minerals (MULTIVITAMINS THER. W/MINERALS) TABS Take 1 tablet by mouth daily.     Yes Historical Provider, MD  Omega-3 Fatty Acids (FISH OIL) 1000 MG CAPS Take 1 capsule by mouth daily.   Yes Historical Provider, MD  clonazePAM (KLONOPIN) 1 MG tablet Take 1 mg by mouth 2 (two) times daily as needed for anxiety.    Historical Provider, MD   BP 119/70 mmHg  Pulse 98  Temp(Src) 98.3 F (36.8 C) (Oral)  Resp 18  SpO2 99% Physical Exam  Constitutional: He is oriented to person, place, and time. He appears well-developed and well-nourished.  HENT:  Head: Normocephalic and atraumatic.  Eyes: EOM are normal. Pupils are equal, round, and reactive to light.  Neck: Normal range of motion. Neck supple. No JVD present.  Cardiovascular: Normal rate and regular rhythm.  Exam reveals no gallop and no friction rub.   No murmur heard. Pulmonary/Chest: No respiratory distress. He has no wheezes.  Abdominal: He exhibits no distension. There is no rebound and no guarding.  Musculoskeletal: Normal range of motion.  Neurological: He is alert and oriented to person, place, and time.  Skin: No rash noted. No pallor.  Psychiatric: He has a normal mood and affect. He is agitated. He expresses homicidal ideation. He expresses no suicidal ideation. He expresses no suicidal plans and no homicidal plans.    ED Course  Procedures (including critical care time) Labs Review Labs Reviewed  ETHANOL - Abnormal; Notable for the following:    Alcohol, Ethyl (B) 64 (*)    All other components within normal limits  URINE RAPID DRUG SCREEN, HOSP PERFORMED    Imaging Review No results found.   EKG Interpretation None      MDM   Final diagnoses:  Psychosis, unspecified  psychosis type  Drug use    24 yo M with a chief complaint of homicidal ideation. Patient was agitated upon arrival was given Geodon and Ativan. She had some improvement with this is now cooperative sitting he will be placed at [facility]. Patient appears to be clinically intoxicated will observe an EDC if he continues to be homicidal once he sobers up.  TTS contacted.  Care turned over to Dr. Betsey Holiday.   Deno Etienne, DO 02/05/15 [place], DO 02/05/15 2340

## 2015-02-05 NOTE — ED Notes (Signed)
Patient is resting comfortably. 

## 2015-02-05 NOTE — ED Notes (Signed)
Pt came back on the unit and stated,'I hate all these people in [place] looking at me." Pt was offered a meal and drink and was cooperative after he was told he could choose between a shot or pills. Pt stated,"I do not do shots I will take the pills." pt was instructed to lie on the bed and let the medication work. He earlier was looking at himself in the mirror and stated,"man I am really fucked up." I never want to speak or see my parents ever again." [employer] close by . Pt does contract for safety-pt is requesting to go to [facility] and see Dr. Reece Levy.Pt stated,"I think people will kill me."

## 2015-02-06 NOTE — ED Notes (Signed)
Patient to aggravated to obtain vital signs. Will try again later, RN aware

## 2015-02-06 NOTE — ED Notes (Addendum)
Pt is asleep and will be transferred to [facility] today. He is resting comfortably and is no respiratory distress. 9:15a-Pt became very agitated and walked out in the hall stating,"God damn I do not know why they are doing all this to me." pt was medicated with 45m of clonipin.Security at bedside. [facility] -Report given to [employer]  [facility] was called for transport.Pt appears very nervous with his right leg shaking. Pts mom wants to come back and pt stated that would be fine. Per pt it is okay for mom to take his apt. [place] Pt remains paranoid and is unsure why people want to kill him. Spoke with LBarbaraann Rondoand made her aware pt must have 479mof Humira sub Q on 02/08/15 and must take this every 2 weeks [facility]. Self inject pen.[facility]attie Hawack at [facility] made her aware one dose of the medication will go with the pt . Pt must have this shot on August 4th 2016.

## 2015-02-06 NOTE — ED Notes (Signed)
Informed patient of plan of care, to be transferred to [facility] after 0900 am. Patient also gave permission for staff to inform mother of plan.

## 2015-02-06 NOTE — BH Assessment (Signed)
Seeking inpt placement. Sent referrals to: [address], [place], [place], [place], [place], [place] Triage Specialist 02/06/2015 2:52 AM
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
## 2015-02-06 NOTE — ED Notes (Signed)
Patient awake, walking in room gait unsteady. Talking to self.

## 2015-02-06 NOTE — BH Assessment (Addendum)
Per Suanne Marker, pt has been accepted to [facility], to arrive after 9 am to [place]. He will be under the care of Dr. Hulen Luster. Report to be called to [phone].  Informed Clyda Greener of acceptance. She will inform day shift.     Lear Ng, [facility] Triage Specialist 02/06/2015 5:21 AM

## 2016-02-23 ENCOUNTER — Ambulatory Visit (INDEPENDENT_AMBULATORY_CARE_PROVIDER_SITE_OTHER): Payer: BLUE CROSS/BLUE SHIELD | Admitting: Physician Assistant

## 2016-02-23 ENCOUNTER — Encounter: Payer: Self-pay | Admitting: Physician Assistant

## 2016-02-23 VITALS — BP 118/78 | HR 92 | Temp 97.2°F | Resp 16 | Ht 71.0 in | Wt 221.0 lb

## 2016-02-23 DIAGNOSIS — Z23 Encounter for immunization: Secondary | ICD-10-CM | POA: Diagnosis not present

## 2016-02-23 DIAGNOSIS — Z130 Encounter for screening for diseases of the blood and blood-forming organs and certain disorders involving the immune mechanism: Secondary | ICD-10-CM | POA: Diagnosis not present

## 2016-02-23 NOTE — Progress Notes (Signed)
Patient ID: Seth Fields, male     DOB: 04-Oct-1990, 25 y.o.    MRN: [id number]  PCP: No primary care provider on file.  Chief Complaint  Patient presents with  . Other    Pt needs sickle cell solubility test    Subjective:    HPI  Presents for sickle cell screening.  Is joining the [employer]   Prior to Admission medications   Medication Sig Start Date End Date Taking? Authorizing Provider  Adalimumab (HUMIRA) 40 MG/0.8ML PSKT Inject 40 mg into the skin every 14 (fourteen) days.   Yes Historical Provider, MD  ARIPiprazole (ABILIFY MAINTENA) 400 MG SUSR Inject 400 mg into the muscle every 28 (twenty-eight) days.   Yes Historical Provider, MD  ARIPiprazole (ABILIFY) 10 MG tablet Take 1 tablet (10 mg total) by mouth at bedtime. 01/28/15  Yes Delfin Gant, NP  azaTHIOprine (IMURAN) 50 MG tablet Take 100 mg by mouth daily.  07/21/11  Yes Historical Provider, MD  B Complex-Biotin-FA (B-50 COMPLEX PO) Take 1 capsule by mouth daily.   Yes Historical Provider, MD  cholecalciferol (VITAMIN D) 1000 UNITS tablet Take 5,000 Units by mouth daily.    Yes Historical Provider, MD  citalopram (CELEXA) 10 MG tablet Take 1 tablet (10 mg total) by mouth daily. 01/28/15  Yes Delfin Gant, NP  clonazePAM (KLONOPIN) 1 MG tablet Take 1 mg by mouth 2 (two) times daily as needed for anxiety.   Yes Historical Provider, MD  folic acid (FOLVITE) 1 MG tablet Take 1 mg by mouth daily.     Yes Historical Provider, MD  hydrOXYzine (VISTARIL) 25 MG capsule Take 25 mg by mouth 3 (three) times daily as needed for anxiety.    Yes Historical Provider, MD  LamoTRIgine XR 200 MG TB24 Take 1 tablet by mouth at bedtime.    Yes Historical Provider, MD  Multiple Vitamins-Minerals (MULTIVITAMINS THER. W/MINERALS) TABS Take 1 tablet by mouth daily.     Yes Historical Provider, MD  Omega-3 Fatty Acids (FISH OIL) 1000 MG CAPS Take 1 capsule by mouth daily.   Yes Historical Provider, MD      Allergies  Allergen Reactions  . Cholestatin   . Clozaril [Clozapine]     Muscles stiffened up  . Gluten      Patient Active Problem List   Diagnosis Date Noted  . Psychosis 01/28/2015  . Crohn disease ([place]) 01/30/2012     No family history on file.   Social History   Social History  . Marital status: Single    Spouse name: n/a  . Number of children: 0  . Years of education: N/A   Occupational History  . 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 Smoking status: Current Some Day Smoker    Packs/day: 0.00    Types: Cigarettes  . Smokeless tobacco: Former Systems developer  . Alcohol use Yes     Comment: "every now and then," 1-2 beers every other week  . Drug use: No     Comment: Patient reports he used "molly" and cocaine.   . Sexual activity: Not on file   Other Topics Concern  . Not on file   Social History Narrative   Lives alone, with caregiver support.   His family lives in [place], [place].        Review of Systems No chest pain, SOB, HA, dizziness, vision change, N/V, diarrhea, constipation,  dysuria, urinary urgency or frequency, myalgias, arthralgias or rash.       Objective:  Physical Exam  Constitutional: He is oriented to person, place, and time. He appears well-developed and well-nourished. He is active and cooperative. No distress.  BP 118/78 (BP Location: Left Arm, Patient Position: Sitting, Cuff Size: Normal)   Pulse 92   Temp 97.2 F (36.2 C) (Oral)   Resp 16   Ht 5' 11"  (1.803 m)   Wt 221 lb (100.2 kg)   SpO2 96%   BMI 30.82 kg/m    Eyes: Conjunctivae are normal.  Pulmonary/Chest: Effort normal.  Neurological: He is alert and oriented to person, place, and time.  Psychiatric: He has a normal mood and affect. His speech is normal and behavior is normal.             Assessment & Plan:  1. Encounter for sickle-cell screening - Sickle cell screen  2. Need for TD  vaccine - Td vaccine greater than or equal to 7yo preservative free IM  3. Need for influenza vaccination - Flu Vaccine QUAD 36+ mos IM   Fara Chute, PA-C Physician Assistant-Certified [facility]

## 2016-02-23 NOTE — Patient Instructions (Signed)
     IF you received an x-ray today, you will receive an invoice from [facility]. Please contact [facility] at [phone] with questions or concerns regarding your invoice.   IF you received labwork today, you will receive an invoice from [facility]. Please contact [facility] at [phone] with questions or concerns regarding your invoice.   Our billing staff will not be able to assist you with questions regarding bills from these companies.  You will be contacted with the lab results as soon as they are available. The fastest way to get your results is to activate your My Chart account. Instructions are located on the last page of this paperwork. If you have not heard from us regarding the results in 2 weeks, please contact this office.      

## 2016-02-26 LAB — SICKLE CELL SCREEN: Sickle Cell Screen: NEGATIVE

## 2016-02-27 ENCOUNTER — Telehealth: Payer: Self-pay | Admitting: *Deleted

## 2016-10-01 ENCOUNTER — Encounter: Payer: Self-pay | Admitting: Physician Assistant

## 2019-03-22 ENCOUNTER — Other Ambulatory Visit: Payer: Self-pay | Admitting: Registered"

## 2019-03-22 DIAGNOSIS — Z20822 Contact with and (suspected) exposure to covid-19: Secondary | ICD-10-CM

## 2019-03-24 LAB — NOVEL CORONAVIRUS, NAA: SARS-CoV-2, NAA: NOT DETECTED

## 2019-06-14 DIAGNOSIS — F250 Schizoaffective disorder, bipolar type: Secondary | ICD-10-CM | POA: Diagnosis not present

## 2019-06-15 DIAGNOSIS — F250 Schizoaffective disorder, bipolar type: Secondary | ICD-10-CM | POA: Diagnosis not present

## 2019-06-15 DIAGNOSIS — F902 Attention-deficit hyperactivity disorder, combined type: Secondary | ICD-10-CM | POA: Diagnosis not present

## 2019-06-21 DIAGNOSIS — F250 Schizoaffective disorder, bipolar type: Secondary | ICD-10-CM | POA: Diagnosis not present

## 2019-06-28 DIAGNOSIS — F902 Attention-deficit hyperactivity disorder, combined type: Secondary | ICD-10-CM | POA: Diagnosis not present

## 2019-06-28 DIAGNOSIS — F250 Schizoaffective disorder, bipolar type: Secondary | ICD-10-CM | POA: Diagnosis not present

## 2019-07-12 DIAGNOSIS — F250 Schizoaffective disorder, bipolar type: Secondary | ICD-10-CM | POA: Diagnosis not present

## 2019-07-12 DIAGNOSIS — F902 Attention-deficit hyperactivity disorder, combined type: Secondary | ICD-10-CM | POA: Diagnosis not present

## 2019-07-19 DIAGNOSIS — F250 Schizoaffective disorder, bipolar type: Secondary | ICD-10-CM | POA: Diagnosis not present

## 2019-07-26 DIAGNOSIS — F250 Schizoaffective disorder, bipolar type: Secondary | ICD-10-CM | POA: Diagnosis not present

## 2019-08-09 DIAGNOSIS — K5080 Crohn's disease of both small and large intestine without complications: Secondary | ICD-10-CM | POA: Diagnosis not present

## 2019-08-09 DIAGNOSIS — Z79899 Other long term (current) drug therapy: Secondary | ICD-10-CM | POA: Diagnosis not present

## 2019-08-09 DIAGNOSIS — Z9049 Acquired absence of other specified parts of digestive tract: Secondary | ICD-10-CM | POA: Diagnosis not present

## 2019-08-09 DIAGNOSIS — Z9889 Other specified postprocedural states: Secondary | ICD-10-CM | POA: Diagnosis not present

## 2019-08-11 DIAGNOSIS — Z79891 Long term (current) use of opiate analgesic: Secondary | ICD-10-CM | POA: Diagnosis not present

## 2019-08-11 DIAGNOSIS — F902 Attention-deficit hyperactivity disorder, combined type: Secondary | ICD-10-CM | POA: Diagnosis not present

## 2019-08-11 DIAGNOSIS — F250 Schizoaffective disorder, bipolar type: Secondary | ICD-10-CM | POA: Diagnosis not present

## 2019-08-16 DIAGNOSIS — F250 Schizoaffective disorder, bipolar type: Secondary | ICD-10-CM | POA: Diagnosis not present

## 2019-08-23 DIAGNOSIS — F250 Schizoaffective disorder, bipolar type: Secondary | ICD-10-CM | POA: Diagnosis not present

## 2019-09-06 DIAGNOSIS — F250 Schizoaffective disorder, bipolar type: Secondary | ICD-10-CM | POA: Diagnosis not present

## 2019-09-13 DIAGNOSIS — F250 Schizoaffective disorder, bipolar type: Secondary | ICD-10-CM | POA: Diagnosis not present

## 2019-09-20 DIAGNOSIS — F250 Schizoaffective disorder, bipolar type: Secondary | ICD-10-CM | POA: Diagnosis not present

## 2019-09-22 ENCOUNTER — Ambulatory Visit: Payer: BLUE CROSS/BLUE SHIELD | Attending: Internal Medicine

## 2019-09-22 DIAGNOSIS — Z23 Encounter for immunization: Secondary | ICD-10-CM

## 2019-09-22 NOTE — Progress Notes (Signed)
   Covid-19 Vaccination Clinic  Name:  RAMIZ TURPIN    MRN: [id number] DOB: 1990/11/25  09/22/2019  Mr. Denio was observed post Covid-19 immunization for 15 minutes without incident. He was provided with Vaccine Information Sheet and instruction to access the V-Safe system.   Mr. Claire was instructed to call 911 with any severe reactions post vaccine: Marland Kitchen Difficulty breathing  . Swelling of face and throat  . A fast heartbeat  . A bad rash all over body  . Dizziness and weakness   Immunizations Administered    Name Date Dose VIS Date Route   Pfizer COVID-19 Vaccine 09/22/2019 10:08 AM 0.3 mL 06/17/2019 Intramuscular   Manufacturer: [place]   Lot: [id number]   [place]: [address]-9

## 2019-10-04 DIAGNOSIS — F250 Schizoaffective disorder, bipolar type: Secondary | ICD-10-CM | POA: Diagnosis not present

## 2019-10-11 DIAGNOSIS — F250 Schizoaffective disorder, bipolar type: Secondary | ICD-10-CM | POA: Diagnosis not present

## 2019-10-18 ENCOUNTER — Ambulatory Visit: Payer: BLUE CROSS/BLUE SHIELD | Attending: Internal Medicine

## 2019-10-18 DIAGNOSIS — F250 Schizoaffective disorder, bipolar type: Secondary | ICD-10-CM | POA: Diagnosis not present

## 2019-10-18 DIAGNOSIS — Z23 Encounter for immunization: Secondary | ICD-10-CM

## 2019-10-18 NOTE — Progress Notes (Signed)
   Covid-19 Vaccination Clinic  Name:  CALOGERO GEISEN    MRN: [id number] DOB: 05-14-1991  10/18/2019  Mr. Roessner was observed post Covid-19 immunization for 15 minutes without incident. He was provided with Vaccine Information Sheet and instruction to access the V-Safe system.   Mr. Matherne was instructed to call 911 with any severe reactions post vaccine: Marland Kitchen Difficulty breathing  . Swelling of face and throat  . A fast heartbeat  . A bad rash all over body  . Dizziness and weakness   Immunizations Administered    Name Date Dose VIS Date Route   Pfizer COVID-19 Vaccine 10/18/2019  8:28 AM 0.3 mL 06/17/2019 Intramuscular   Manufacturer: [place]   Lot: [id number]   [place]: [address]-6

## 2019-11-01 DIAGNOSIS — F250 Schizoaffective disorder, bipolar type: Secondary | ICD-10-CM | POA: Diagnosis not present

## 2019-11-08 DIAGNOSIS — F250 Schizoaffective disorder, bipolar type: Secondary | ICD-10-CM | POA: Diagnosis not present

## 2019-11-29 DIAGNOSIS — F250 Schizoaffective disorder, bipolar type: Secondary | ICD-10-CM | POA: Diagnosis not present

## 2019-12-06 DIAGNOSIS — F250 Schizoaffective disorder, bipolar type: Secondary | ICD-10-CM | POA: Diagnosis not present

## 2019-12-07 DIAGNOSIS — Z79891 Long term (current) use of opiate analgesic: Secondary | ICD-10-CM | POA: Diagnosis not present

## 2019-12-07 DIAGNOSIS — F250 Schizoaffective disorder, bipolar type: Secondary | ICD-10-CM | POA: Diagnosis not present

## 2019-12-07 DIAGNOSIS — F902 Attention-deficit hyperactivity disorder, combined type: Secondary | ICD-10-CM | POA: Diagnosis not present

## 2019-12-27 DIAGNOSIS — F902 Attention-deficit hyperactivity disorder, combined type: Secondary | ICD-10-CM | POA: Diagnosis not present

## 2019-12-27 DIAGNOSIS — F250 Schizoaffective disorder, bipolar type: Secondary | ICD-10-CM | POA: Diagnosis not present

## 2020-01-03 DIAGNOSIS — F250 Schizoaffective disorder, bipolar type: Secondary | ICD-10-CM | POA: Diagnosis not present

## 2020-01-10 DIAGNOSIS — F250 Schizoaffective disorder, bipolar type: Secondary | ICD-10-CM | POA: Diagnosis not present

## 2020-01-25 DIAGNOSIS — F250 Schizoaffective disorder, bipolar type: Secondary | ICD-10-CM | POA: Diagnosis not present

## 2020-01-31 DIAGNOSIS — F250 Schizoaffective disorder, bipolar type: Secondary | ICD-10-CM | POA: Diagnosis not present

## 2020-02-02 DIAGNOSIS — Z79899 Other long term (current) drug therapy: Secondary | ICD-10-CM | POA: Diagnosis not present

## 2020-02-02 DIAGNOSIS — Z9049 Acquired absence of other specified parts of digestive tract: Secondary | ICD-10-CM | POA: Diagnosis not present

## 2020-02-02 DIAGNOSIS — K50819 Crohn's disease of both small and large intestine with unspecified complications: Secondary | ICD-10-CM | POA: Diagnosis not present

## 2020-02-02 DIAGNOSIS — Z8719 Personal history of other diseases of the digestive system: Secondary | ICD-10-CM | POA: Diagnosis not present

## 2020-02-02 DIAGNOSIS — Z09 Encounter for follow-up examination after completed treatment for conditions other than malignant neoplasm: Secondary | ICD-10-CM | POA: Diagnosis not present

## 2020-02-07 DIAGNOSIS — F250 Schizoaffective disorder, bipolar type: Secondary | ICD-10-CM | POA: Diagnosis not present

## 2020-02-08 DIAGNOSIS — Z79891 Long term (current) use of opiate analgesic: Secondary | ICD-10-CM | POA: Diagnosis not present

## 2020-02-08 DIAGNOSIS — F902 Attention-deficit hyperactivity disorder, combined type: Secondary | ICD-10-CM | POA: Diagnosis not present

## 2020-02-08 DIAGNOSIS — F250 Schizoaffective disorder, bipolar type: Secondary | ICD-10-CM | POA: Diagnosis not present

## 2020-02-21 DIAGNOSIS — F250 Schizoaffective disorder, bipolar type: Secondary | ICD-10-CM | POA: Diagnosis not present

## 2020-02-21 DIAGNOSIS — F902 Attention-deficit hyperactivity disorder, combined type: Secondary | ICD-10-CM | POA: Diagnosis not present

## 2020-02-29 DIAGNOSIS — F251 Schizoaffective disorder, depressive type: Secondary | ICD-10-CM | POA: Diagnosis not present

## 2020-03-05 DIAGNOSIS — F250 Schizoaffective disorder, bipolar type: Secondary | ICD-10-CM | POA: Diagnosis not present

## 2020-03-19 DIAGNOSIS — F250 Schizoaffective disorder, bipolar type: Secondary | ICD-10-CM | POA: Diagnosis not present

## 2020-03-28 DIAGNOSIS — F250 Schizoaffective disorder, bipolar type: Secondary | ICD-10-CM | POA: Diagnosis not present

## 2020-04-02 DIAGNOSIS — F250 Schizoaffective disorder, bipolar type: Secondary | ICD-10-CM | POA: Diagnosis not present

## 2020-04-11 DIAGNOSIS — Z79891 Long term (current) use of opiate analgesic: Secondary | ICD-10-CM | POA: Diagnosis not present

## 2020-04-11 DIAGNOSIS — F250 Schizoaffective disorder, bipolar type: Secondary | ICD-10-CM | POA: Diagnosis not present

## 2020-04-16 DIAGNOSIS — F250 Schizoaffective disorder, bipolar type: Secondary | ICD-10-CM | POA: Diagnosis not present

## 2020-04-25 DIAGNOSIS — F251 Schizoaffective disorder, depressive type: Secondary | ICD-10-CM | POA: Diagnosis not present

## 2020-04-30 DIAGNOSIS — F250 Schizoaffective disorder, bipolar type: Secondary | ICD-10-CM | POA: Diagnosis not present

## 2020-05-14 DIAGNOSIS — F250 Schizoaffective disorder, bipolar type: Secondary | ICD-10-CM | POA: Diagnosis not present

## 2020-05-23 DIAGNOSIS — F250 Schizoaffective disorder, bipolar type: Secondary | ICD-10-CM | POA: Diagnosis not present

## 2020-05-28 DIAGNOSIS — F250 Schizoaffective disorder, bipolar type: Secondary | ICD-10-CM | POA: Diagnosis not present

## 2020-06-06 DIAGNOSIS — F250 Schizoaffective disorder, bipolar type: Secondary | ICD-10-CM | POA: Diagnosis not present

## 2020-06-06 DIAGNOSIS — F902 Attention-deficit hyperactivity disorder, combined type: Secondary | ICD-10-CM | POA: Diagnosis not present

## 2020-06-06 DIAGNOSIS — Z79891 Long term (current) use of opiate analgesic: Secondary | ICD-10-CM | POA: Diagnosis not present

## 2020-06-11 DIAGNOSIS — F250 Schizoaffective disorder, bipolar type: Secondary | ICD-10-CM | POA: Diagnosis not present

## 2020-06-11 DIAGNOSIS — F902 Attention-deficit hyperactivity disorder, combined type: Secondary | ICD-10-CM | POA: Diagnosis not present

## 2020-06-20 DIAGNOSIS — F250 Schizoaffective disorder, bipolar type: Secondary | ICD-10-CM | POA: Diagnosis not present

## 2020-07-09 DIAGNOSIS — F250 Schizoaffective disorder, bipolar type: Secondary | ICD-10-CM | POA: Diagnosis not present

## 2020-07-18 DIAGNOSIS — F251 Schizoaffective disorder, depressive type: Secondary | ICD-10-CM | POA: Diagnosis not present

## 2020-07-23 DIAGNOSIS — F251 Schizoaffective disorder, depressive type: Secondary | ICD-10-CM | POA: Diagnosis not present

## 2020-07-31 DIAGNOSIS — Z9889 Other specified postprocedural states: Secondary | ICD-10-CM | POA: Diagnosis not present

## 2020-07-31 DIAGNOSIS — K50819 Crohn's disease of both small and large intestine with unspecified complications: Secondary | ICD-10-CM | POA: Diagnosis not present

## 2020-07-31 DIAGNOSIS — Z79899 Other long term (current) drug therapy: Secondary | ICD-10-CM | POA: Diagnosis not present

## 2020-07-31 DIAGNOSIS — K5000 Crohn's disease of small intestine without complications: Secondary | ICD-10-CM | POA: Diagnosis not present

## 2020-08-01 DIAGNOSIS — F902 Attention-deficit hyperactivity disorder, combined type: Secondary | ICD-10-CM | POA: Diagnosis not present

## 2020-08-01 DIAGNOSIS — Z79891 Long term (current) use of opiate analgesic: Secondary | ICD-10-CM | POA: Diagnosis not present

## 2020-08-01 DIAGNOSIS — F250 Schizoaffective disorder, bipolar type: Secondary | ICD-10-CM | POA: Diagnosis not present

## 2020-08-06 DIAGNOSIS — F902 Attention-deficit hyperactivity disorder, combined type: Secondary | ICD-10-CM | POA: Diagnosis not present

## 2020-08-06 DIAGNOSIS — F250 Schizoaffective disorder, bipolar type: Secondary | ICD-10-CM | POA: Diagnosis not present

## 2020-08-15 DIAGNOSIS — F250 Schizoaffective disorder, bipolar type: Secondary | ICD-10-CM | POA: Diagnosis not present

## 2020-09-03 DIAGNOSIS — F250 Schizoaffective disorder, bipolar type: Secondary | ICD-10-CM | POA: Diagnosis not present

## 2020-09-12 DIAGNOSIS — F250 Schizoaffective disorder, bipolar type: Secondary | ICD-10-CM | POA: Diagnosis not present

## 2020-09-17 DIAGNOSIS — F250 Schizoaffective disorder, bipolar type: Secondary | ICD-10-CM | POA: Diagnosis not present

## 2020-09-19 ENCOUNTER — Telehealth: Payer: Self-pay | Admitting: Emergency Medicine

## 2020-09-19 NOTE — Telephone Encounter (Signed)
Copied from [place] [id number]. Topic: Medical Record Request - Other >> Sep 17, 2020 12:49 PM Erick Blinks wrote: pt's mother is requesting a call back from the office, requesting medical records from Dr. Sonia Baller  Best contact: [phone], Donnelly Angelica

## 2020-09-27 DIAGNOSIS — F902 Attention-deficit hyperactivity disorder, combined type: Secondary | ICD-10-CM | POA: Diagnosis not present

## 2020-09-27 DIAGNOSIS — F250 Schizoaffective disorder, bipolar type: Secondary | ICD-10-CM | POA: Diagnosis not present

## 2020-10-01 DIAGNOSIS — F250 Schizoaffective disorder, bipolar type: Secondary | ICD-10-CM | POA: Diagnosis not present

## 2020-10-01 DIAGNOSIS — F902 Attention-deficit hyperactivity disorder, combined type: Secondary | ICD-10-CM | POA: Diagnosis not present

## 2020-10-02 DIAGNOSIS — Z79891 Long term (current) use of opiate analgesic: Secondary | ICD-10-CM | POA: Diagnosis not present

## 2020-10-08 ENCOUNTER — Other Ambulatory Visit: Payer: Self-pay

## 2020-10-08 ENCOUNTER — Encounter (HOSPITAL_BASED_OUTPATIENT_CLINIC_OR_DEPARTMENT_OTHER): Payer: Self-pay | Admitting: Family Medicine

## 2020-10-08 ENCOUNTER — Ambulatory Visit (INDEPENDENT_AMBULATORY_CARE_PROVIDER_SITE_OTHER): Payer: BC Managed Care – PPO | Admitting: Family Medicine

## 2020-10-08 DIAGNOSIS — J45909 Unspecified asthma, uncomplicated: Secondary | ICD-10-CM | POA: Insufficient documentation

## 2020-10-08 DIAGNOSIS — Z6830 Body mass index (BMI) 30.0-30.9, adult: Secondary | ICD-10-CM | POA: Diagnosis not present

## 2020-10-08 DIAGNOSIS — J4520 Mild intermittent asthma, uncomplicated: Secondary | ICD-10-CM | POA: Diagnosis not present

## 2020-10-08 DIAGNOSIS — E785 Hyperlipidemia, unspecified: Secondary | ICD-10-CM | POA: Diagnosis not present

## 2020-10-08 MED ORDER — ALBUTEROL SULFATE HFA 108 (90 BASE) MCG/ACT IN AERS: 2.0000 | INHALATION_SPRAY | Freq: Four times a day (QID) | RESPIRATORY_TRACT | 11 refills | Status: DC | PRN

## 2020-10-08 NOTE — Progress Notes (Signed)
New Patient Office Visit  Subjective:  Patient ID: Seth Fields, male    DOB: Aug 01, 1990  Age: 30 y.o. MRN: [id number]  CC:  Chief Complaint  Patient presents with  . Establish Care    HPI Seth Fields is a 30 year old male presenting to establish in clinic.  No specific complaints today.  Past medical history significant for Crohn's disease, schizoaffective disorder.  Patient is accompanied by his mother today.  In regards to his Crohn's disease, patient follows with Dr. Koleen Distance.  Currently managed on Humira and Imuran.  Has been having regular labs completed with GI due to being on these medications.  In regards to his schizoaffective disorder, follows with Dr. Reece Levy.  Presently taking Abilify, Celexa, clonazepam.  Also reports history of asthma, rarely requires use of short acting inhaler, indicates that he only needs this when he has a URI.  Requesting refill of albuterol inhaler today.  Past Medical History:  Diagnosis Date  . Allergy   . Asthma    Pt reports this is resolved  . Bipolar affective ([place])   . Crohn's disease ([place])   . Depression   . Gluten intolerance   . Schizo affective schizophrenia ([place])   . Stomach ulcer     Past Surgical History:  Procedure Laterality Date  . BOWEL RESECTION    . SMALL INTESTINE SURGERY      History reviewed. No pertinent family history.  Social History   Socioeconomic History  . Marital status: Single    Spouse name: n/a  . Number of children: 0  . Years of education: Not on file  . Highest education level: Not on file  Occupational History  . Occupation: Ship broker    Comment: Psychologist, forensic at [employer]  . Smoking status: Current Some Day Smoker    Packs/day: 0.00    Types: Cigarettes  . Smokeless tobacco: Former Network engineer and Sexual Activity  . Alcohol use: Yes    Comment: "every now and then," 1-2 beers every other week  . Drug use: No    Comment: Patient reports he  used "molly" and cocaine.   . Sexual activity: Not on file  Other Topics Concern  . Not on file  Social History Narrative   Lives alone, with caregiver support.   His family lives in [place], [place].   Social Determinants of Health   Financial Resource Strain: Not on file  Food Insecurity: Not on file  Transportation Needs: Not on file  Physical Activity: Not on file  Stress: Not on file  Social Connections: Not on file  Intimate Partner Violence: Not on file    Objective:   Today's Vitals: BP 118/82   Pulse (!) 105   Ht 5' 11"  (1.803 m)   Wt 220 lb 3.2 oz (99.9 kg)   SpO2 99%   BMI 30.71 kg/m   Physical Exam  Pleasant 30 year old male in distress Cardiovascular exam with regular rate rhythm, no murmurs appreciated Lungs clear to auscultation bilaterally  Assessment & Plan:   Problem List Items Addressed This Visit      Respiratory   Asthma   Relevant Medications   albuterol (VENTOLIN HFA) 108 (90 Base) MCG/ACT inhaler     Other   BMI 30.0-30.9,adult   Hyperlipidemia   Relevant Medications   prazosin (MINIPRESS) 5 MG capsule      Outpatient Encounter Medications as of 10/08/2020  Medication Sig  . Adalimumab 40 MG/0.8ML PSKT Inject 40  mg into the skin every 14 (fourteen) days.  Marland Kitchen albuterol (VENTOLIN HFA) 108 (90 Base) MCG/ACT inhaler Inhale 2 puffs into the lungs every 6 (six) hours as needed for wheezing.  Marland Kitchen amphetamine-dextroamphetamine (ADDERALL) 20 MG tablet Take 1 tablet by mouth daily in the afternoon.  . ARIPiprazole (ABILIFY) 10 MG tablet Take 1 tablet (10 mg total) by mouth at bedtime.  . ARIPiprazole ER 400 MG SUSR Inject 400 mg into the muscle every 28 (twenty-eight) days.  Marland Kitchen ascorbic acid (VITAMIN C) 500 MG tablet Take 1 tablet by mouth daily.  Marland Kitchen azaTHIOprine (IMURAN) 50 MG tablet Take 100 mg by mouth daily.   . B Complex-Biotin-FA (B-50 COMPLEX PO) Take 1 capsule by mouth daily.  . cholecalciferol (VITAMIN D) 1000 UNITS tablet Take 5,000 Units  by mouth daily.  . citalopram (CELEXA) 10 MG tablet Take 1 tablet (10 mg total) by mouth daily.  . clonazePAM (KLONOPIN) 1 MG tablet Take 1 mg by mouth 2 (two) times daily as needed for anxiety.  . Cyanocobalamin (VITAMIN B12 PO) Take 1 tablet by mouth daily.  . DULoxetine (CYMBALTA) 60 MG capsule Take 60 mg by mouth 2 (two) times daily.  . folic acid (FOLVITE) 1 MG tablet Take 1 mg by mouth daily.  . hydrOXYzine (VISTARIL) 25 MG capsule Take 25 mg by mouth 3 (three) times daily as needed for anxiety.   . LamoTRIgine XR 200 MG TB24 Take 1 tablet by mouth at bedtime.   Marland Kitchen LATUDA 80 MG TABS tablet Take 1 tablet by mouth at bedtime.  Marland Kitchen lisdexamfetamine (VYVANSE) 70 MG capsule Take 1 capsule by mouth in the morning.  . Misc Natural Products (T-RELIEF CBD+13 SL) Take 1 capsule by mouth daily.  . Multiple Vitamins-Minerals (MULTIVITAMINS THER. W/MINERALS) TABS Take 1 tablet by mouth daily.  . naltrexone (DEPADE) 50 MG tablet Take 50 mg by mouth daily.  . Naproxen Sodium 220 MG CAPS Take 1 tablet by mouth as needed.  . Omega-3 Fatty Acids (FISH OIL) 1000 MG CAPS Take 1 capsule by mouth daily.  . prazosin (MINIPRESS) 5 MG capsule Take 5 mg by mouth at bedtime.  . SUMAtriptan (IMITREX) 50 MG tablet Take 1 tablet by mouth as needed for migraine.  Marland Kitchen UNABLE TO FIND Take 1 tablet by mouth as directed.   No facility-administered encounter medications on file as of 10/08/2020.    Follow-up: Return in about 6 months (around 04/09/2021) for Follow Up.   Sigmund Morera J De [place], MD

## 2020-10-08 NOTE — Patient Instructions (Addendum)
  Medication Instructions:  Your physician has recommended you make the following change in your medication: -- START Albuterol Sulfate - Take 1-2 puffs as needed  --If you need a refill on any your medications before your next appointment, please call your pharmacy first. If no refills are authorized on file call the office.--  Follow-Up: Your next appointment:   Your physician recommends that you schedule a follow-up appointment in: 6 MONTHS with Dr. De [place]  Thanks for letting us be apart of your health journey!!  Primary Care and Sports Medicine   Dr. de [place] and [name], [name], [name]  We recommend signing up for the patient portal called "MyChart".  Sign up information is provided on this After Visit Summary.  MyChart is used to connect with patients for Virtual Visits (Telemedicine).  Patients are able to view lab/test results, encounter notes, upcoming appointments, etc.  Non-urgent messages can be sent to your provider as well.   To learn more about what you can do with MyChart, go to [email or web address].

## 2020-10-09 NOTE — Assessment & Plan Note (Signed)
Chronic, at goal No weekly symptoms, only notices symptoms during acute illnesses Prescription for albuterol inhaler sent to pharmacy

## 2020-10-09 NOTE — Assessment & Plan Note (Signed)
Recommend lifestyle modifications as above, likely contributed to by necessary pharmacotherapy We will check labs

## 2020-10-09 NOTE — Assessment & Plan Note (Signed)
Discussed lifestyle modifications, including healthy diet, recommended weekly activity goal.  Patient is active and playing tennis as well as working out and training outside of tennis Monitor weight at follow-up visits

## 2020-10-10 DIAGNOSIS — F250 Schizoaffective disorder, bipolar type: Secondary | ICD-10-CM | POA: Diagnosis not present

## 2020-10-15 DIAGNOSIS — F250 Schizoaffective disorder, bipolar type: Secondary | ICD-10-CM | POA: Diagnosis not present

## 2020-10-29 DIAGNOSIS — F250 Schizoaffective disorder, bipolar type: Secondary | ICD-10-CM | POA: Diagnosis not present

## 2020-11-07 DIAGNOSIS — F251 Schizoaffective disorder, depressive type: Secondary | ICD-10-CM | POA: Diagnosis not present

## 2020-11-12 DIAGNOSIS — F251 Schizoaffective disorder, depressive type: Secondary | ICD-10-CM | POA: Diagnosis not present

## 2020-11-21 DIAGNOSIS — F250 Schizoaffective disorder, bipolar type: Secondary | ICD-10-CM | POA: Diagnosis not present

## 2020-11-26 DIAGNOSIS — F250 Schizoaffective disorder, bipolar type: Secondary | ICD-10-CM | POA: Diagnosis not present

## 2020-12-05 DIAGNOSIS — F250 Schizoaffective disorder, bipolar type: Secondary | ICD-10-CM | POA: Diagnosis not present

## 2020-12-10 DIAGNOSIS — F250 Schizoaffective disorder, bipolar type: Secondary | ICD-10-CM | POA: Diagnosis not present

## 2020-12-24 DIAGNOSIS — F250 Schizoaffective disorder, bipolar type: Secondary | ICD-10-CM | POA: Diagnosis not present

## 2021-01-02 DIAGNOSIS — F250 Schizoaffective disorder, bipolar type: Secondary | ICD-10-CM | POA: Diagnosis not present

## 2021-01-08 DIAGNOSIS — F250 Schizoaffective disorder, bipolar type: Secondary | ICD-10-CM | POA: Diagnosis not present

## 2021-01-09 DIAGNOSIS — Z79891 Long term (current) use of opiate analgesic: Secondary | ICD-10-CM | POA: Diagnosis not present

## 2021-01-09 DIAGNOSIS — F250 Schizoaffective disorder, bipolar type: Secondary | ICD-10-CM | POA: Diagnosis not present

## 2021-01-09 DIAGNOSIS — F1010 Alcohol abuse, uncomplicated: Secondary | ICD-10-CM | POA: Diagnosis not present

## 2021-01-10 DIAGNOSIS — Z79899 Other long term (current) drug therapy: Secondary | ICD-10-CM | POA: Diagnosis not present

## 2021-01-10 DIAGNOSIS — K50819 Crohn's disease of both small and large intestine with unspecified complications: Secondary | ICD-10-CM | POA: Diagnosis not present

## 2021-01-21 DIAGNOSIS — F1010 Alcohol abuse, uncomplicated: Secondary | ICD-10-CM | POA: Diagnosis not present

## 2021-01-21 DIAGNOSIS — F250 Schizoaffective disorder, bipolar type: Secondary | ICD-10-CM | POA: Diagnosis not present

## 2021-01-30 DIAGNOSIS — F251 Schizoaffective disorder, depressive type: Secondary | ICD-10-CM | POA: Diagnosis not present

## 2021-02-04 DIAGNOSIS — F251 Schizoaffective disorder, depressive type: Secondary | ICD-10-CM | POA: Diagnosis not present

## 2021-02-04 DIAGNOSIS — F1210 Cannabis abuse, uncomplicated: Secondary | ICD-10-CM | POA: Diagnosis not present

## 2021-02-06 DIAGNOSIS — Z713 Dietary counseling and surveillance: Secondary | ICD-10-CM | POA: Diagnosis not present

## 2021-02-06 DIAGNOSIS — K319 Disease of stomach and duodenum, unspecified: Secondary | ICD-10-CM | POA: Diagnosis not present

## 2021-02-06 DIAGNOSIS — Z6829 Body mass index (BMI) 29.0-29.9, adult: Secondary | ICD-10-CM | POA: Diagnosis not present

## 2021-02-06 DIAGNOSIS — K5090 Crohn's disease, unspecified, without complications: Secondary | ICD-10-CM | POA: Diagnosis not present

## 2021-02-18 DIAGNOSIS — F1210 Cannabis abuse, uncomplicated: Secondary | ICD-10-CM | POA: Diagnosis not present

## 2021-02-18 DIAGNOSIS — F251 Schizoaffective disorder, depressive type: Secondary | ICD-10-CM | POA: Diagnosis not present

## 2021-02-27 DIAGNOSIS — F251 Schizoaffective disorder, depressive type: Secondary | ICD-10-CM | POA: Diagnosis not present

## 2021-03-04 DIAGNOSIS — F251 Schizoaffective disorder, depressive type: Secondary | ICD-10-CM | POA: Diagnosis not present

## 2021-03-07 ENCOUNTER — Telehealth (HOSPITAL_BASED_OUTPATIENT_CLINIC_OR_DEPARTMENT_OTHER): Payer: Self-pay | Admitting: Family Medicine

## 2021-03-07 NOTE — Telephone Encounter (Signed)
Pts mother called  on 9/1 and stated pt just tested positive for covid and wants to see if he would need to get the Infusion. Pts mother is very concerned because of the type of conditions he has. Pts mom would like the nurse to call her as soon as possible Friday morning. Declined to have a virtual visit. Please advise.

## 2021-03-08 ENCOUNTER — Other Ambulatory Visit (HOSPITAL_BASED_OUTPATIENT_CLINIC_OR_DEPARTMENT_OTHER): Payer: Self-pay

## 2021-03-08 MED ORDER — NIRMATRELVIR/RITONAVIR (PAXLOVID)TABLET: 3.0000 | ORAL_TABLET | Freq: Two times a day (BID) | ORAL | 0 refills | Status: AC

## 2021-03-08 NOTE — Telephone Encounter (Signed)
Severe muscle pain, Fatigue, Labored breathing Patient started to show symptoms yesterday morning and tested positive yesterday afternoon Patients mother states he has he is still feeling fatigued and is still experiencing some labored breathing Patients mother is requesting paxlovid to be sent to [facility] at [address]

## 2021-03-18 DIAGNOSIS — F251 Schizoaffective disorder, depressive type: Secondary | ICD-10-CM | POA: Diagnosis not present

## 2021-03-20 DIAGNOSIS — F250 Schizoaffective disorder, bipolar type: Secondary | ICD-10-CM | POA: Diagnosis not present

## 2021-03-20 DIAGNOSIS — Z79891 Long term (current) use of opiate analgesic: Secondary | ICD-10-CM | POA: Diagnosis not present

## 2021-03-20 DIAGNOSIS — F902 Attention-deficit hyperactivity disorder, combined type: Secondary | ICD-10-CM | POA: Diagnosis not present

## 2021-03-27 DIAGNOSIS — F250 Schizoaffective disorder, bipolar type: Secondary | ICD-10-CM | POA: Diagnosis not present

## 2021-04-01 DIAGNOSIS — F250 Schizoaffective disorder, bipolar type: Secondary | ICD-10-CM | POA: Diagnosis not present

## 2021-04-09 ENCOUNTER — Other Ambulatory Visit: Payer: Self-pay

## 2021-04-09 ENCOUNTER — Encounter (HOSPITAL_BASED_OUTPATIENT_CLINIC_OR_DEPARTMENT_OTHER): Payer: Self-pay | Admitting: Family Medicine

## 2021-04-09 ENCOUNTER — Ambulatory Visit (INDEPENDENT_AMBULATORY_CARE_PROVIDER_SITE_OTHER): Payer: BC Managed Care – PPO | Admitting: Family Medicine

## 2021-04-09 VITALS — BP 104/76 | HR 110 | Ht 71.0 in | Wt 220.0 lb

## 2021-04-09 DIAGNOSIS — Z23 Encounter for immunization: Secondary | ICD-10-CM | POA: Diagnosis not present

## 2021-04-09 DIAGNOSIS — E669 Obesity, unspecified: Secondary | ICD-10-CM

## 2021-04-09 DIAGNOSIS — Z6830 Body mass index (BMI) 30.0-30.9, adult: Secondary | ICD-10-CM | POA: Diagnosis not present

## 2021-04-09 NOTE — Patient Instructions (Signed)
  Medication Instructions:  Your physician recommends that you continue on your current medications as directed. Please refer to the Current Medication list given to you today. --If you need a refill on any your medications before your next appointment, please call your pharmacy first. If no refills are authorized on file call the office.-- Follow-Up: Your next appointment:   Your physician recommends that you schedule a follow-up appointment in: 6 MONTHS with Dr. de [place]  You will receive a text message or e-mail with a link to a survey about your care and experience with [place] today! We would greatly appreciate your feedback!   Thanks for letting us be apart of your health journey!!  Primary Care and Sports Medicine   Dr. Arlina Robes [place]   We encourage you to activate your patient portal called "MyChart".  Sign up information is provided on this After Visit Summary.  MyChart is used to connect with patients for Virtual Visits (Telemedicine).  Patients are able to view lab/test results, encounter notes, upcoming appointments, etc.  Non-urgent messages can be sent to your provider as well. To learn more about what you can do with MyChart, please visit --  [email or web address].

## 2021-04-09 NOTE — Progress Notes (Addendum)
    Procedures performed today:    None.  Independent interpretation of notes and tests performed by another provider:   None.  Brief History, Exam, Impression, and Recommendations:    BP 104/76   Pulse (!) 110   Ht 5' 11"  (1.803 m)   Wt 220 lb (99.8 kg)   SpO2 96%   BMI 30.68 kg/m   BMI 30.0-30.9,adult Reports that he did meet with nutritionist through [facility], reviewed lifestyle modifications, particularly dietary changes.  Some difficulty with this as he tends to eat fast food/takeout relatively frequently.  Patient will be working to make changes in this regard, encouraged to do so  Need for influenza vaccination Patient due for flu vaccine, interested in receiving today, no specific questions Flu vaccine administered in office today  Obesity, Class I, BMI 30-34.9 Reports that he did meet with nutritionist through [facility], reviewed lifestyle modifications, particularly dietary changes.  Some difficulty with this as he tends to eat fast food/takeout relatively frequently.  Patient will be working to make changes in this regard, encouraged to do so  In regards to Crohn's disease, continues to follow with GI, most recently had labs in July without significant abnormality.  Next appointment will be in February, trying to make this slightly earlier in January 2023.  Did have brief episode of blood in the stool, resolved quickly, no associate abdominal pain, no lightheadedness, dizziness, orthostatic symptoms  Patient may be looking to play tennis at [facility], may need sports physical completed prior to doing so, advised that they contact us to schedule sports physical if this is needed  Plan for follow-up in about 6 months or sooner as needed   ___________________________________________ Milaya Hora de [place], MD, ABFM, CAQSM Primary Care and [place]

## 2021-04-09 NOTE — Assessment & Plan Note (Signed)
Patient due for flu vaccine, interested in receiving today, no specific questions Flu vaccine administered in office today

## 2021-04-09 NOTE — Assessment & Plan Note (Addendum)
Reports that he did meet with nutritionist through [facility], reviewed lifestyle modifications, particularly dietary changes.  Some difficulty with this as he tends to eat fast food/takeout relatively frequently.  Patient will be working to make changes in this regard, encouraged to do so

## 2021-04-24 DIAGNOSIS — F250 Schizoaffective disorder, bipolar type: Secondary | ICD-10-CM | POA: Diagnosis not present

## 2021-04-29 DIAGNOSIS — F250 Schizoaffective disorder, bipolar type: Secondary | ICD-10-CM | POA: Diagnosis not present

## 2021-05-08 ENCOUNTER — Other Ambulatory Visit: Payer: Self-pay

## 2021-05-08 ENCOUNTER — Encounter (HOSPITAL_BASED_OUTPATIENT_CLINIC_OR_DEPARTMENT_OTHER): Payer: Self-pay | Admitting: Family Medicine

## 2021-05-08 ENCOUNTER — Ambulatory Visit (INDEPENDENT_AMBULATORY_CARE_PROVIDER_SITE_OTHER): Payer: BC Managed Care – PPO | Admitting: Family Medicine

## 2021-05-08 DIAGNOSIS — H6690 Otitis media, unspecified, unspecified ear: Secondary | ICD-10-CM | POA: Diagnosis not present

## 2021-05-08 MED ORDER — AMOXICILLIN-POT CLAVULANATE 875-125 MG PO TABS: 1.0000 | ORAL_TABLET | Freq: Two times a day (BID) | ORAL | 0 refills | Status: DC

## 2021-05-08 NOTE — Assessment & Plan Note (Signed)
Reports first noticing symptoms about 4 to 7 days ago.  Began with ear discomfort, mostly on right side, minimal on the left side.  Reports experiencing some ringing in the ears bilaterally.  Did try to use some over-the-counter eardrops a few days ago without significant relief of symptoms.  Describes pain as pressure/aching.  Denies any constitutional symptoms such as fevers, chills.  Denies any allergies to penicillin. On exam, bilateral external auditory canals are mostly clear with very small amount of cerumen noted, no impaction.  Fluid present behind right eardrum, no significant bulging.  Left tympanic membrane intact, unremarkable. Discussed options with patient, will proceed with antibiotic treatment for 7 days.  Will utilize Augmentin. Continue with conservative measures, can use OTC measures to help with pain control as needed.  Recommend ensuring adequate hydration, adequate rest. Plan for follow-up as needed, if symptoms or not improving as expected over the next 7 to 10 days, recommend returning to clinic for further evaluation

## 2021-05-08 NOTE — Progress Notes (Signed)
    Procedures performed today:    None.  Independent interpretation of notes and tests performed by another provider:   None.  Brief History, Exam, Impression, and Recommendations:    BP 102/86   Pulse (!) 112   Ht 5' 11"  (1.803 m)   Wt 218 lb 6.4 oz (99.1 kg)   SpO2 98%   BMI 30.46 kg/m   Acute otitis media Reports first noticing symptoms about 4 to 7 days ago.  Began with ear discomfort, mostly on right side, minimal on the left side.  Reports experiencing some ringing in the ears bilaterally.  Did try to use some over-the-counter eardrops a few days ago without significant relief of symptoms.  Describes pain as pressure/aching.  Denies any constitutional symptoms such as fevers, chills.  Denies any allergies to penicillin. On exam, bilateral external auditory canals are mostly clear with very small amount of cerumen noted, no impaction.  Fluid present behind right eardrum, no significant bulging.  Left tympanic membrane intact, unremarkable. Discussed options with patient, will proceed with antibiotic treatment for 7 days.  Will utilize Augmentin. Continue with conservative measures, can use OTC measures to help with pain control as needed.  Recommend ensuring adequate hydration, adequate rest. Plan for follow-up as needed, if symptoms or not improving as expected over the next 7 to 10 days, recommend returning to clinic for further evaluation    ___________________________________________ Emmory Solivan de [place], MD, ABFM, CAQSM Primary Care and [place]

## 2021-05-08 NOTE — Patient Instructions (Signed)
  Medication Instructions:  Your physician has recommended you make the following change in your medication:  -- Start Augmentin - Take 1 tablet twice daily for 7 days  --If you need a refill on any your medications before your next appointment, please call your pharmacy first. If no refills are authorized on file call the office.- Keep Appointment that is scheduled in April  You will receive a text message or e-mail with a link to a survey about your care and experience with [place] today! We would greatly appreciate your feedback!   Thanks for letting us be apart of your health journey!!  Primary Care and Sports Medicine   Dr. Arlina Robes [place]   We encourage you to activate your patient portal called "MyChart".  Sign up information is provided on this After Visit Summary.  MyChart is used to connect with patients for Virtual Visits (Telemedicine).  Patients are able to view lab/test results, encounter notes, upcoming appointments, etc.  Non-urgent messages can be sent to your provider as well. To learn more about what you can do with MyChart, please visit --  [email or web address].

## 2021-05-15 DIAGNOSIS — Z79891 Long term (current) use of opiate analgesic: Secondary | ICD-10-CM | POA: Diagnosis not present

## 2021-05-15 DIAGNOSIS — F250 Schizoaffective disorder, bipolar type: Secondary | ICD-10-CM | POA: Diagnosis not present

## 2021-05-22 DIAGNOSIS — F250 Schizoaffective disorder, bipolar type: Secondary | ICD-10-CM | POA: Diagnosis not present

## 2021-05-28 DIAGNOSIS — F250 Schizoaffective disorder, bipolar type: Secondary | ICD-10-CM | POA: Diagnosis not present

## 2021-06-05 DIAGNOSIS — E669 Obesity, unspecified: Secondary | ICD-10-CM | POA: Insufficient documentation

## 2021-06-05 DIAGNOSIS — E66811 Obesity, class 1: Secondary | ICD-10-CM | POA: Insufficient documentation

## 2021-06-05 NOTE — Assessment & Plan Note (Signed)
Reports that he did meet with nutritionist through [facility], reviewed lifestyle modifications, particularly dietary changes.  Some difficulty with this as he tends to eat fast food/takeout relatively frequently.  Patient will be working to make changes in this regard, encouraged to do so

## 2021-06-10 DIAGNOSIS — F250 Schizoaffective disorder, bipolar type: Secondary | ICD-10-CM | POA: Diagnosis not present

## 2021-06-19 DIAGNOSIS — F250 Schizoaffective disorder, bipolar type: Secondary | ICD-10-CM | POA: Diagnosis not present

## 2021-06-24 DIAGNOSIS — F250 Schizoaffective disorder, bipolar type: Secondary | ICD-10-CM | POA: Diagnosis not present

## 2021-07-09 DIAGNOSIS — Z79631 Long term (current) use of antimetabolite agent: Secondary | ICD-10-CM | POA: Diagnosis not present

## 2021-07-09 DIAGNOSIS — Z8719 Personal history of other diseases of the digestive system: Secondary | ICD-10-CM | POA: Diagnosis not present

## 2021-07-09 DIAGNOSIS — Z791 Long term (current) use of non-steroidal anti-inflammatories (NSAID): Secondary | ICD-10-CM | POA: Diagnosis not present

## 2021-07-09 DIAGNOSIS — K50819 Crohn's disease of both small and large intestine with unspecified complications: Secondary | ICD-10-CM | POA: Diagnosis not present

## 2021-07-09 DIAGNOSIS — K5080 Crohn's disease of both small and large intestine without complications: Secondary | ICD-10-CM | POA: Diagnosis not present

## 2021-07-09 DIAGNOSIS — Z9049 Acquired absence of other specified parts of digestive tract: Secondary | ICD-10-CM | POA: Diagnosis not present

## 2021-07-09 DIAGNOSIS — Z79899 Other long term (current) drug therapy: Secondary | ICD-10-CM | POA: Diagnosis not present

## 2021-07-09 DIAGNOSIS — Z79624 Long term (current) use of inhibitors of nucleotide synthesis: Secondary | ICD-10-CM | POA: Diagnosis not present

## 2021-07-09 DIAGNOSIS — K56699 Other intestinal obstruction unspecified as to partial versus complete obstruction: Secondary | ICD-10-CM | POA: Diagnosis not present

## 2021-07-10 DIAGNOSIS — F902 Attention-deficit hyperactivity disorder, combined type: Secondary | ICD-10-CM | POA: Diagnosis not present

## 2021-07-10 DIAGNOSIS — F250 Schizoaffective disorder, bipolar type: Secondary | ICD-10-CM | POA: Diagnosis not present

## 2021-07-11 DIAGNOSIS — F250 Schizoaffective disorder, bipolar type: Secondary | ICD-10-CM | POA: Diagnosis not present

## 2021-07-11 DIAGNOSIS — Z79891 Long term (current) use of opiate analgesic: Secondary | ICD-10-CM | POA: Diagnosis not present

## 2021-07-17 DIAGNOSIS — F250 Schizoaffective disorder, bipolar type: Secondary | ICD-10-CM | POA: Diagnosis not present

## 2021-07-22 DIAGNOSIS — F250 Schizoaffective disorder, bipolar type: Secondary | ICD-10-CM | POA: Diagnosis not present

## 2021-08-05 DIAGNOSIS — F250 Schizoaffective disorder, bipolar type: Secondary | ICD-10-CM | POA: Diagnosis not present

## 2021-08-14 DIAGNOSIS — F250 Schizoaffective disorder, bipolar type: Secondary | ICD-10-CM | POA: Diagnosis not present

## 2021-08-23 DIAGNOSIS — F250 Schizoaffective disorder, bipolar type: Secondary | ICD-10-CM | POA: Diagnosis not present

## 2021-09-02 DIAGNOSIS — F250 Schizoaffective disorder, bipolar type: Secondary | ICD-10-CM | POA: Diagnosis not present

## 2021-09-05 DIAGNOSIS — F902 Attention-deficit hyperactivity disorder, combined type: Secondary | ICD-10-CM | POA: Diagnosis not present

## 2021-09-05 DIAGNOSIS — F250 Schizoaffective disorder, bipolar type: Secondary | ICD-10-CM | POA: Diagnosis not present

## 2021-09-05 DIAGNOSIS — Z79891 Long term (current) use of opiate analgesic: Secondary | ICD-10-CM | POA: Diagnosis not present

## 2021-09-11 DIAGNOSIS — F250 Schizoaffective disorder, bipolar type: Secondary | ICD-10-CM | POA: Diagnosis not present

## 2021-09-16 DIAGNOSIS — F250 Schizoaffective disorder, bipolar type: Secondary | ICD-10-CM | POA: Diagnosis not present

## 2021-09-30 DIAGNOSIS — F250 Schizoaffective disorder, bipolar type: Secondary | ICD-10-CM | POA: Diagnosis not present

## 2021-10-08 ENCOUNTER — Encounter (HOSPITAL_BASED_OUTPATIENT_CLINIC_OR_DEPARTMENT_OTHER): Payer: Self-pay | Admitting: Family Medicine

## 2021-10-08 ENCOUNTER — Ambulatory Visit (INDEPENDENT_AMBULATORY_CARE_PROVIDER_SITE_OTHER): Payer: BC Managed Care – PPO | Admitting: Family Medicine

## 2021-10-08 DIAGNOSIS — J4520 Mild intermittent asthma, uncomplicated: Secondary | ICD-10-CM | POA: Diagnosis not present

## 2021-10-08 DIAGNOSIS — Z0000 Encounter for general adult medical examination without abnormal findings: Secondary | ICD-10-CM

## 2021-10-08 DIAGNOSIS — E785 Hyperlipidemia, unspecified: Secondary | ICD-10-CM | POA: Diagnosis not present

## 2021-10-08 DIAGNOSIS — F250 Schizoaffective disorder, bipolar type: Secondary | ICD-10-CM | POA: Diagnosis not present

## 2021-10-08 DIAGNOSIS — G43909 Migraine, unspecified, not intractable, without status migrainosus: Secondary | ICD-10-CM | POA: Diagnosis not present

## 2021-10-08 DIAGNOSIS — E669 Obesity, unspecified: Secondary | ICD-10-CM | POA: Diagnosis not present

## 2021-10-08 MED ORDER — ALBUTEROL SULFATE HFA 108 (90 BASE) MCG/ACT IN AERS: 2.0000 | INHALATION_SPRAY | Freq: Four times a day (QID) | RESPIRATORY_TRACT | 3 refills | Status: DC | PRN

## 2021-10-08 MED ORDER — SUMATRIPTAN SUCCINATE 50 MG PO TABS: 50.0000 mg | ORAL_TABLET | ORAL | 1 refills | Status: DC | PRN

## 2021-10-08 NOTE — Assessment & Plan Note (Signed)
Continues to work on lifestyle modifications, weight generally unchanged since last visit ?He is currently playing tennis regularly, encouraged to continue with exercise ?Goal of working on weight loss is affected by current medication regimen ?

## 2021-10-08 NOTE — Assessment & Plan Note (Signed)
Noted on prior labs, has not had check of cholesterol panel since 2021.  We will plan to update lipid panel with upcoming labs for wellness exam ?

## 2021-10-08 NOTE — Assessment & Plan Note (Signed)
Managing with sumatriptan, indicates good control with this.  Generally uses infrequently.  Medication that he has at home will be expiring soon, requesting refill of sumatriptan at this time. ?Refill for sumatriptan sent to pharmacy on file ?

## 2021-10-08 NOTE — Progress Notes (Signed)
? ? ?  Procedures performed today:   ? ?None. ? ?Independent interpretation of notes and tests performed by another provider:  ? ?None. ? ?Brief History, Exam, Impression, and Recommendations:   ? ?BP 117/72   Pulse 96   Temp 98.7 ?F (37.1 ?C)   Ht 6' (1.829 m)   Wt 222 lb (100.7 kg)   SpO2 99%   BMI 30.11 kg/m?  ? ?Migraine without status migrainosus, not intractable ?Managing with sumatriptan, indicates good control with this.  Generally uses infrequently.  Medication that he has at home will be expiring soon, requesting refill of sumatriptan at this time. ?Refill for sumatriptan sent to pharmacy on file ? ?Asthma ?Chronic problem for patient, generally symptoms are infrequent, denies any regular issues, typically occurs with acute illness.  Requesting refill for albuterol inhaler to have available as needed.  Refill sent to pharmacy on file ? ?Obesity, Class I, BMI 30-34.9 ?Continues to work on lifestyle modifications, weight generally unchanged since last visit ?He is currently playing tennis regularly, encouraged to continue with exercise ?Goal of working on weight loss is affected by current medication regimen ? ?Hyperlipidemia ?Noted on prior labs, has not had check of cholesterol panel since 2021.  We will plan to update lipid panel with upcoming labs for wellness exam ? ?Plan for follow-up in about 3 to 4 months for CPE, he will plan to have labs completed through his GI office, if not able to do so then may need to complete part of his labs with [place] at time of next visit ? ? ?___________________________________________ ?Seth Fields de [place], MD, ABFM, CAQSM ?Primary Care and Sports Medicine ?[place] ?

## 2021-10-08 NOTE — Assessment & Plan Note (Signed)
Chronic problem for patient, generally symptoms are infrequent, denies any regular issues, typically occurs with acute illness.  Requesting refill for albuterol inhaler to have available as needed.  Refill sent to pharmacy on file ?

## 2021-10-08 NOTE — Patient Instructions (Signed)
?  Medication Instructions:  ?Your physician recommends that you continue on your current medications as directed. Please refer to the Current Medication list given to you today. ?--If you need a refill on any your medications before your next appointment, please call your pharmacy first. If no refills are authorized on file call the office.-- ?Lab Work: ?Your physician has recommended that you have lab work today: Gave orders to patient ? ? ? ? ?Follow-Up: ?Your next appointment:   ?Your physician recommends that you schedule a follow-up appointment in: 3 months follow up with Dr. Tennis Must [place] ? ?You will receive a text message or e-mail with a link to a survey about your care and experience with [place] today! We would greatly appreciate your feedback!  ? ?Thanks for letting us be apart of your health journey!!  ?Primary Care and Sports Medicine  ? ?Dr. Kyung Rudd de [place]  ? ?We encourage you to activate your patient portal called "MyChart".  Sign up information is provided on this After Visit Summary.  MyChart is used to connect with patients for Virtual Visits (Telemedicine).  Patients are able to view lab/test results, encounter notes, upcoming appointments, etc.  Non-urgent messages can be sent to your provider as well. To learn more about what you can do with MyChart, please visit --  [email or web address].    ?

## 2021-10-10 DIAGNOSIS — D2372 Other benign neoplasm of skin of left lower limb, including hip: Secondary | ICD-10-CM | POA: Diagnosis not present

## 2021-10-10 DIAGNOSIS — L821 Other seborrheic keratosis: Secondary | ICD-10-CM | POA: Diagnosis not present

## 2021-10-10 DIAGNOSIS — L918 Other hypertrophic disorders of the skin: Secondary | ICD-10-CM | POA: Diagnosis not present

## 2021-10-30 DIAGNOSIS — F902 Attention-deficit hyperactivity disorder, combined type: Secondary | ICD-10-CM | POA: Diagnosis not present

## 2021-10-30 DIAGNOSIS — Z79899 Other long term (current) drug therapy: Secondary | ICD-10-CM | POA: Diagnosis not present

## 2021-10-30 DIAGNOSIS — F250 Schizoaffective disorder, bipolar type: Secondary | ICD-10-CM | POA: Diagnosis not present

## 2021-11-06 DIAGNOSIS — F251 Schizoaffective disorder, depressive type: Secondary | ICD-10-CM | POA: Diagnosis not present

## 2021-11-11 DIAGNOSIS — F250 Schizoaffective disorder, bipolar type: Secondary | ICD-10-CM | POA: Diagnosis not present

## 2021-11-11 DIAGNOSIS — F902 Attention-deficit hyperactivity disorder, combined type: Secondary | ICD-10-CM | POA: Diagnosis not present

## 2021-12-04 DIAGNOSIS — F250 Schizoaffective disorder, bipolar type: Secondary | ICD-10-CM | POA: Diagnosis not present

## 2021-12-09 DIAGNOSIS — F902 Attention-deficit hyperactivity disorder, combined type: Secondary | ICD-10-CM | POA: Diagnosis not present

## 2021-12-09 DIAGNOSIS — F250 Schizoaffective disorder, bipolar type: Secondary | ICD-10-CM | POA: Diagnosis not present

## 2021-12-23 DIAGNOSIS — F902 Attention-deficit hyperactivity disorder, combined type: Secondary | ICD-10-CM | POA: Diagnosis not present

## 2021-12-23 DIAGNOSIS — F250 Schizoaffective disorder, bipolar type: Secondary | ICD-10-CM | POA: Diagnosis not present

## 2021-12-25 DIAGNOSIS — F250 Schizoaffective disorder, bipolar type: Secondary | ICD-10-CM | POA: Diagnosis not present

## 2022-01-01 DIAGNOSIS — F251 Schizoaffective disorder, depressive type: Secondary | ICD-10-CM | POA: Diagnosis not present

## 2022-01-02 DIAGNOSIS — Z79620 Long term (current) use of immunosuppressive biologic: Secondary | ICD-10-CM | POA: Diagnosis not present

## 2022-01-02 DIAGNOSIS — Z9049 Acquired absence of other specified parts of digestive tract: Secondary | ICD-10-CM | POA: Diagnosis not present

## 2022-01-02 DIAGNOSIS — Z79631 Long term (current) use of antimetabolite agent: Secondary | ICD-10-CM | POA: Diagnosis not present

## 2022-01-02 DIAGNOSIS — Z9889 Other specified postprocedural states: Secondary | ICD-10-CM | POA: Diagnosis not present

## 2022-01-02 DIAGNOSIS — K50819 Crohn's disease of both small and large intestine with unspecified complications: Secondary | ICD-10-CM | POA: Diagnosis not present

## 2022-01-08 ENCOUNTER — Encounter (HOSPITAL_BASED_OUTPATIENT_CLINIC_OR_DEPARTMENT_OTHER): Payer: Self-pay | Admitting: Family Medicine

## 2022-01-08 ENCOUNTER — Ambulatory Visit (INDEPENDENT_AMBULATORY_CARE_PROVIDER_SITE_OTHER): Payer: BC Managed Care – PPO | Admitting: Family Medicine

## 2022-01-08 DIAGNOSIS — Z0000 Encounter for general adult medical examination without abnormal findings: Secondary | ICD-10-CM | POA: Diagnosis not present

## 2022-01-08 DIAGNOSIS — E669 Obesity, unspecified: Secondary | ICD-10-CM

## 2022-01-08 NOTE — Assessment & Plan Note (Signed)
Questions today regarding interventions related to weight loss.  Discussed lifestyle modifications, pharmacotherapy including oral and injectable options.  Also discussed healthy weight and wellness clinic.  Patient is interested in referral to healthy weight and wellness, referral placed today

## 2022-01-08 NOTE — Assessment & Plan Note (Signed)
Routine HCM labs reviewed. HCM reviewed/discussed. Anticipatory guidance regarding healthy weight, lifestyle and choices given. Recommend healthy diet.  Recommend approximately 150 minutes/week of moderate intensity exercise Recommend regular dental and vision exams Always use seatbelt/lap and shoulder restraints Recommend using smoke alarms and checking batteries at least twice a year Recommend using sunscreen when outside Discussed tetanus immunization recommendations, patient is UTD

## 2022-01-08 NOTE — Progress Notes (Signed)
Subjective:    CC: Annual Physical Exam  HPI:  Seth Fields is a 31 y.o. presenting for annual physical  I reviewed the past medical history, family history, social history, surgical history, and allergies today and no changes were needed.  Please see the problem list section below in epic for further details.  Past Medical History: Past Medical History:  Diagnosis Date   Allergy    Asthma    Pt reports this is resolved   Bipolar affective ([place])    Crohn's disease ([place])    Depression    Gluten intolerance    Schizo affective schizophrenia ([place])    Stomach ulcer    Past Surgical History: Past Surgical History:  Procedure Laterality Date   BOWEL RESECTION     SMALL INTESTINE SURGERY     Social History: Social History   Socioeconomic History   Marital status: Single    Spouse name: n/a   Number of children: 0   Years of education: Not on file   Highest education level: Not on file  Occupational History   Occupation: Ship broker    Comment: English and Engineer, materials at [employer] Use   Smoking status: Some Days    Packs/day: 0.00    Types: Cigarettes   Smokeless tobacco: Former  Substance and Sexual Activity   Alcohol use: Yes    Comment: "every now and then," 1-2 beers every other week   Drug use: No    Comment: Patient reports he used "molly" and cocaine.    Sexual activity: Not on file  Other Topics Concern   Not on file  Social History Narrative   Lives alone, with caregiver support.   His family lives in [place], [place].   Social Determinants of Health   Financial Resource Strain: Not on file  Food Insecurity: Not on file  Transportation Needs: Not on file  Physical Activity: Not on file  Stress: Not on file  Social Connections: Not on file   Family History: History reviewed. No pertinent family history. Allergies: Allergies  Allergen Reactions   Cefaclor Dermatitis, Hives and Rash   Gluten Meal Other (See Comments)    Other  reaction(s): Psychosis (intolerance)   Cholestatin    Clozaril [Clozapine]     Muscles stiffened up   Gluten    Medications: See med rec.  Review of Systems: No headache, visual changes, nausea, vomiting, diarrhea, constipation, dizziness, abdominal pain, skin rash, fevers, chills, night sweats, swollen lymph nodes, weight loss, chest pain, body aches, joint swelling, muscle aches, shortness of breath, mood changes, visual or auditory hallucinations.  Objective:    BP 120/85   Pulse 80   Temp 97.8 F (36.6 C) (Oral)   Ht 6' (1.829 m)   Wt 225 lb 11.2 oz (102.4 kg)   SpO2 95%   BMI 30.61 kg/m   General: Well Developed, well nourished, and in no acute distress. Neuro: Alert and oriented x3, extra-ocular muscles intact, sensation grossly intact. Cranial nerves II through XII are intact, motor, sensory, and coordinative functions are all intact. HEENT: Normocephalic, atraumatic, pupils equal round reactive to light, neck supple, no masses, no lymphadenopathy, thyroid nonpalpable. Oropharynx, nasopharynx, external ear canals are unremarkable. Skin: Warm and dry, no rashes noted.  Cardiac: Regular rate and rhythm, no murmurs rubs or gallops.  Respiratory: Clear to auscultation bilaterally. Not using accessory muscles, speaking in full sentences.  Abdominal: Soft, nontender, nondistended, positive bowel sounds, no masses, no organomegaly.  Musculoskeletal: Shoulder, elbow, wrist,  hip, knee, ankle stable, and with full range of motion.  Impression and Recommendations:    Wellness examination Routine HCM labs reviewed. HCM reviewed/discussed. Anticipatory guidance regarding healthy weight, lifestyle and choices given. Recommend healthy diet.  Recommend approximately 150 minutes/week of moderate intensity exercise Recommend regular dental and vision exams Always use seatbelt/lap and shoulder restraints Recommend using smoke alarms and checking batteries at least twice a year Recommend  using sunscreen when outside Discussed tetanus immunization recommendations, patient is UTD  Obesity, Class I, BMI 30-34.9 Questions today regarding interventions related to weight loss.  Discussed lifestyle modifications, pharmacotherapy including oral and injectable options.  Also discussed healthy weight and wellness clinic.  Patient is interested in referral to healthy weight and wellness, referral placed today  Return in about 6 months (around 07/11/2022) for Weight, HLD.   ___________________________________________ Kaysen Deal de [place], MD, ABFM, CAQSM Primary [facility] and [place]

## 2022-01-09 DIAGNOSIS — F251 Schizoaffective disorder, depressive type: Secondary | ICD-10-CM | POA: Diagnosis not present

## 2022-01-21 DIAGNOSIS — F251 Schizoaffective disorder, depressive type: Secondary | ICD-10-CM | POA: Diagnosis not present

## 2022-01-29 DIAGNOSIS — F251 Schizoaffective disorder, depressive type: Secondary | ICD-10-CM | POA: Diagnosis not present

## 2022-02-03 DIAGNOSIS — F251 Schizoaffective disorder, depressive type: Secondary | ICD-10-CM | POA: Diagnosis not present

## 2022-02-14 DIAGNOSIS — F251 Schizoaffective disorder, depressive type: Secondary | ICD-10-CM | POA: Diagnosis not present

## 2022-02-19 DIAGNOSIS — F902 Attention-deficit hyperactivity disorder, combined type: Secondary | ICD-10-CM | POA: Diagnosis not present

## 2022-02-19 DIAGNOSIS — F250 Schizoaffective disorder, bipolar type: Secondary | ICD-10-CM | POA: Diagnosis not present

## 2022-02-26 DIAGNOSIS — F250 Schizoaffective disorder, bipolar type: Secondary | ICD-10-CM | POA: Diagnosis not present

## 2022-03-17 DIAGNOSIS — F250 Schizoaffective disorder, bipolar type: Secondary | ICD-10-CM | POA: Diagnosis not present

## 2022-03-26 DIAGNOSIS — F250 Schizoaffective disorder, bipolar type: Secondary | ICD-10-CM | POA: Diagnosis not present

## 2022-04-15 DIAGNOSIS — F3181 Bipolar II disorder: Secondary | ICD-10-CM | POA: Diagnosis not present

## 2022-04-16 DIAGNOSIS — F902 Attention-deficit hyperactivity disorder, combined type: Secondary | ICD-10-CM | POA: Diagnosis not present

## 2022-04-16 DIAGNOSIS — F250 Schizoaffective disorder, bipolar type: Secondary | ICD-10-CM | POA: Diagnosis not present

## 2022-04-23 DIAGNOSIS — F250 Schizoaffective disorder, bipolar type: Secondary | ICD-10-CM | POA: Diagnosis not present

## 2022-04-28 DIAGNOSIS — F902 Attention-deficit hyperactivity disorder, combined type: Secondary | ICD-10-CM | POA: Diagnosis not present

## 2022-04-28 DIAGNOSIS — F250 Schizoaffective disorder, bipolar type: Secondary | ICD-10-CM | POA: Diagnosis not present

## 2022-05-08 ENCOUNTER — Encounter (INDEPENDENT_AMBULATORY_CARE_PROVIDER_SITE_OTHER): Payer: Self-pay | Admitting: Internal Medicine

## 2022-05-08 ENCOUNTER — Ambulatory Visit (INDEPENDENT_AMBULATORY_CARE_PROVIDER_SITE_OTHER): Payer: BC Managed Care – PPO | Admitting: Internal Medicine

## 2022-05-08 VITALS — BP 122/84 | HR 109 | Temp 98.3°F | Ht 71.0 in | Wt 213.0 lb

## 2022-05-08 DIAGNOSIS — Z6829 Body mass index (BMI) 29.0-29.9, adult: Secondary | ICD-10-CM

## 2022-05-08 DIAGNOSIS — Z0289 Encounter for other administrative examinations: Secondary | ICD-10-CM

## 2022-05-08 DIAGNOSIS — E669 Obesity, unspecified: Secondary | ICD-10-CM

## 2022-05-08 DIAGNOSIS — E785 Hyperlipidemia, unspecified: Secondary | ICD-10-CM

## 2022-05-08 NOTE — Progress Notes (Signed)
Office: [phone]  /  Fax: [phone]   Initial Visit  Seth Fields was seen in clinic today to evaluate for obesity. He is interested in losing weight to improve overall health and reduce the risk of weight related complications. He presents today to review program treatment options, initial physical assessment, and evaluation.  He has a history of Crohn's disease and is on immune suppressants and history of schizoaffective disorder on psychotropic medication.  Mother states that he had been malnourished when he was young because of his Crohn's.  He had been on growth hormone at the time.  He has gained weight once he got effectively treated.  He does not cook and most of his meals are prepared.  He has been trying to lose weight by making healthier choices.  He was referred by: PCP He is accompanied by his mother  When asked what else they would like to accomplish? He states: Improve existing medical conditions  When asked how has your weight affected you? He states: Contributed to medical problems  Some associated conditions: Hyperlipidemia  Contributing factors: Medications and Mental health problems  Weight promoting medications identified: Psychotropic medications  Current nutrition plan: None  Current level of physical activity: None  Current or previous pharmacotherapy: None  Response to medication: Never tried medications   Past medical history includes:   Past Medical History:  Diagnosis Date   Allergy    Asthma    Pt reports this is resolved   Bipolar affective ([place])    Crohn's disease ([place])    Depression    Gluten intolerance    Schizo affective schizophrenia ([place])    Stomach ulcer      Objective:   BP 122/84   Pulse (!) 109   Temp 98.3 F (36.8 C)   Ht 5' 11"  (1.803 m)   Wt 213 lb (96.6 kg)   SpO2 97%   BMI 29.71 kg/m  He was weighed on the bioimpedance scale: Body mass index is 29.71 kg/m.  Peak Weight: 227,Visceral Fat Rating: 10, Body  Fat%: 26.9, Weight trend over the last 12 months: Decreasing  General:  Alert, oriented and cooperative. Patient is in no acute distress.  Respiratory: Normal respiratory effort, no problems with respiration noted  Extremities: Normal range of motion.    Mental Status: Normal mood and affect. Normal behavior. Normal judgment and thought content.   Assessment and Plan:  1. Obesity, Class I, BMI 30-34.9 We reviewed weight, biometrics, associated medical conditions and contributing factors with patient. Some of his weight gain is associated with medications. He would benefit from weight loss therapy via a modified calorie, low-carb, high-protein nutritional plan tailored to their REE (resting energy expenditure) which will be determined by indirect calorimetry.  We will also assess for cardiometabolic risk and nutritional derangements via fasting serologies at his next appointment.   2. Hyperlipidemia, unspecified hyperlipidemia type According to mother, not on medications. Can't find most recent lipids. Will check lipid panel with fasting intake labs.        Obesity Treatment / Action Plan:  Patient will work on garnering support from family and friends to begin weight loss journey. Will work on eliminating or reducing the presence of highly palatable, calorie dense foods in the home. Will complete provided nutritional and psychosocial assessment questionnaire before the next appointment. Will be scheduled for indirect calorimetry to determine resting energy expenditure in a fasting state.  This will allow [place] to create a reduced calorie, high-protein meal plan to  promote loss of fat mass while preserving muscle mass. Was counseled on nutritional approaches to weight loss and benefits of complex carbs and high quality protein as part of nutritional weight management.  Obesity Education Performed Today:  He was weighed on the bioimpedance scale and results were discussed and documented in the  synopsis.  We discussed obesity as a disease and the importance of a more detailed evaluation of all the factors contributing to the disease.  We discussed the importance of long term lifestyle changes which include nutrition, exercise and behavioral modifications as well as the importance of customizing this to his specific health and social needs.  We discussed the benefits of reaching a healthier weight to alleviate the symptoms of existing conditions and reduce the risks of the biomechanical, metabolic and psychological effects of obesity.  Seth Fields appears to be in the action stage of change and states they are ready to start intensive lifestyle modifications and behavioral modifications.  30 minutes was spent today on this visit including the above counseling, pre-visit chart review, and post-visit documentation.  Reviewed by clinician on day of visit: allergies, medications, problem list, medical history, surgical history, family history, social history, and previous encounter notes.      I have reviewed the above documentation for accuracy and completeness, and I agree with the above. Thomes Dinning, MD

## 2022-05-26 DIAGNOSIS — F250 Schizoaffective disorder, bipolar type: Secondary | ICD-10-CM | POA: Diagnosis not present

## 2022-05-26 DIAGNOSIS — F902 Attention-deficit hyperactivity disorder, combined type: Secondary | ICD-10-CM | POA: Diagnosis not present

## 2022-06-09 DIAGNOSIS — F902 Attention-deficit hyperactivity disorder, combined type: Secondary | ICD-10-CM | POA: Diagnosis not present

## 2022-06-09 DIAGNOSIS — F250 Schizoaffective disorder, bipolar type: Secondary | ICD-10-CM | POA: Diagnosis not present

## 2022-06-11 DIAGNOSIS — Z79899 Other long term (current) drug therapy: Secondary | ICD-10-CM | POA: Diagnosis not present

## 2022-06-11 DIAGNOSIS — F250 Schizoaffective disorder, bipolar type: Secondary | ICD-10-CM | POA: Diagnosis not present

## 2022-06-12 DIAGNOSIS — K5090 Crohn's disease, unspecified, without complications: Secondary | ICD-10-CM | POA: Diagnosis not present

## 2022-06-12 DIAGNOSIS — K5080 Crohn's disease of both small and large intestine without complications: Secondary | ICD-10-CM | POA: Diagnosis not present

## 2022-06-12 DIAGNOSIS — K50819 Crohn's disease of both small and large intestine with unspecified complications: Secondary | ICD-10-CM | POA: Diagnosis not present

## 2022-06-18 ENCOUNTER — Ambulatory Visit (INDEPENDENT_AMBULATORY_CARE_PROVIDER_SITE_OTHER): Payer: BC Managed Care – PPO | Admitting: Internal Medicine

## 2022-06-18 ENCOUNTER — Encounter (INDEPENDENT_AMBULATORY_CARE_PROVIDER_SITE_OTHER): Payer: Self-pay | Admitting: Internal Medicine

## 2022-06-18 VITALS — BP 104/72 | HR 94 | Temp 97.6°F | Ht 71.0 in | Wt 210.0 lb

## 2022-06-18 DIAGNOSIS — Z79899 Other long term (current) drug therapy: Secondary | ICD-10-CM | POA: Diagnosis not present

## 2022-06-18 DIAGNOSIS — Z6825 Body mass index (BMI) 25.0-25.9, adult: Secondary | ICD-10-CM | POA: Diagnosis not present

## 2022-06-18 DIAGNOSIS — R5383 Other fatigue: Secondary | ICD-10-CM | POA: Diagnosis not present

## 2022-06-18 DIAGNOSIS — R0602 Shortness of breath: Secondary | ICD-10-CM | POA: Diagnosis not present

## 2022-06-18 DIAGNOSIS — E785 Hyperlipidemia, unspecified: Secondary | ICD-10-CM | POA: Diagnosis not present

## 2022-06-18 DIAGNOSIS — Z1331 Encounter for screening for depression: Secondary | ICD-10-CM

## 2022-06-18 DIAGNOSIS — E663 Overweight: Secondary | ICD-10-CM | POA: Diagnosis not present

## 2022-06-19 LAB — LIPID PANEL WITH LDL/HDL RATIO
Cholesterol, Total: 198 mg/dL (ref 100–199)
HDL: 41 mg/dL (ref >39)
LDL Chol Calc (NIH): 118 mg/dL — ABNORMAL HIGH (ref 0–99)
LDL/HDL Ratio: 2.9 ratio (ref 0.0–3.6)
Triglycerides: 223 mg/dL — ABNORMAL HIGH (ref 0–149)
VLDL Cholesterol Cal: 39 mg/dL (ref 5–40)

## 2022-06-19 LAB — COMPREHENSIVE METABOLIC PANEL
ALT: 40 IU/L (ref 0–44)
AST: 28 IU/L (ref 0–40)
Albumin/Globulin Ratio: 1.9 (ref 1.2–2.2)
Albumin: 4.8 g/dL (ref 4.1–5.1)
Alkaline Phosphatase: 86 IU/L (ref 44–121)
BUN/Creatinine Ratio: 15 (ref 9–20)
BUN: 16 mg/dL (ref 6–20)
Bilirubin Total: 0.5 mg/dL (ref 0.0–1.2)
CO2: 23 mmol/L (ref 20–29)
Calcium: 10.2 mg/dL (ref 8.7–10.2)
Chloride: 101 mmol/L (ref 96–106)
Creatinine, Ser: 1.05 mg/dL (ref 0.76–1.27)
Globulin, Total: 2.5 g/dL (ref 1.5–4.5)
Glucose: 93 mg/dL (ref 70–99)
Potassium: 4.8 mmol/L (ref 3.5–5.2)
Sodium: 139 mmol/L (ref 134–144)
Total Protein: 7.3 g/dL (ref 6.0–8.5)
eGFR: 96 mL/min/{1.73_m2} (ref >59)

## 2022-06-19 LAB — INSULIN, RANDOM: INSULIN: 23.4 u[IU]/mL (ref 2.6–24.9)

## 2022-06-19 LAB — TSH: TSH: 2 u[IU]/mL (ref 0.450–4.500)

## 2022-06-19 LAB — VITAMIN D 25 HYDROXY (VIT D DEFICIENCY, FRACTURES): Vit D, 25-Hydroxy: 38.6 ng/mL (ref 30.0–100.0)

## 2022-06-19 LAB — HEMOGLOBIN A1C
Est. average glucose Bld gHb Est-mCnc: 97 mg/dL
Hgb A1c MFr Bld: 5 % (ref 4.8–5.6)

## 2022-06-23 DIAGNOSIS — F250 Schizoaffective disorder, bipolar type: Secondary | ICD-10-CM | POA: Diagnosis not present

## 2022-06-23 DIAGNOSIS — F902 Attention-deficit hyperactivity disorder, combined type: Secondary | ICD-10-CM | POA: Diagnosis not present

## 2022-06-25 DIAGNOSIS — F250 Schizoaffective disorder, bipolar type: Secondary | ICD-10-CM | POA: Diagnosis not present

## 2022-07-01 NOTE — Progress Notes (Unsigned)
Chief Complaint:   Seth Fields (MR# [id number]) is a 31 y.o. male who presents for evaluation and treatment of obesity and related comorbidities. Current BMI is Body mass index is 29.29 kg/m. Seth Fields has been struggling with his weight for many years and has been unsuccessful in either losing weight, maintaining weight loss, or reaching his healthy weight goal.  Seth Fields is currently in the action stage of change and ready to dedicate time achieving and maintaining a healthier weight. Seth Fields is interested in becoming our patient and working on intensive lifestyle modifications including (but not limited to) diet and exercise for weight loss.  Patient is accompanied by his mother. His meals are mostly prepared or takeout.   Seth Fields's habits were reviewed today and are as follows: he started gaining weight 6 years ago, his heaviest weight ever was 224 pounds, he is a picky eater and doesn't like to eat healthier foods, he has significant food cravings issues, he snacks frequently in the evenings, he is frequently drinking liquids with calories, and he frequently eats larger portions than normal.  Depression Screen [facility]'s Food and Mood (modified PHQ-9) score was 2.  Subjective:   1. Other fatigue Detrell admits to daytime somnolence and  NO ANSWER  waking up still tired. Patient has a history of symptoms of daytime fatigue. Seth Fields generally gets  NO ANSWER  hours of sleep per night, and states that he has generally restful sleep. Snoring is present. Apneic episodes are not present. Epworth Sleepiness Score is 6.   2. SOB (shortness of breath) on exertion Seth Fields notes increasing shortness of breath with exercising and seems to be worsening over time with weight gain. He notes getting out of breath sooner with activity than he used to. This has gotten worse recently. Eula denies shortness of breath at rest or orthopnea.  3. Hyperlipidemia, unspecified hyperlipidemia  type Diagnosis per chart review. Patient is not on treatment and can't find recent Lipitor.  4. Polypharmacy ECG showed no QT prolongation. Some may contribute to daytime fatigue. Lamictal recently decreased by psychiatrist.  Assessment/Plan:   1. Other fatigue Seth Fields does feel that his weight is causing his energy to be lower than it should be. Fatigue may be related to obesity, depression or many other causes. Labs will be ordered, and in the meanwhile, Seth Fields will focus on self care including making healthy food choices, increasing physical activity and focusing on stress reduction.  Lab/Orders today: - EKG 12-Lead - Comprehensive metabolic panel  2. SOB (shortness of breath) on exertion Seth Fields does feel that he gets out of breath more easily that he used to when he exercises. Mandy's shortness of breath appears to be obesity related and exercise induced. He has agreed to work on weight loss and gradually increase exercise to treat his exercise induced shortness of breath. Will continue to monitor closely.  3. Hyperlipidemia, unspecified hyperlipidemia type Lab/Orders today: - Lipid Panel With LDL/HDL Ratio  4. Polypharmacy We will continue to monitor.  5. Depression screen Seth Fields had a negative depression screening. Depression is commonly associated with obesity and often results in emotional eating behaviors. We will monitor this closely and work on CBT to help improve the non-hunger eating patterns. Referral to Psychology may be required if no improvement is seen as he continues in our clinic.  6. Overweight (BMI 25.0-29.9) Lab/Orders today: - Hemoglobin A1c - Insulin, random - TSH - VITAMIN D 25 Hydroxy (Vit-D Deficiency, Fractures)  Seth Fields is currently in the  action stage of change and his goal is to continue with weight loss efforts. I recommend [place] begin the structured treatment plan as follows:  He has agreed to the Category 2 Plan.  Exercise goals: All adults  should avoid inactivity. Some physical activity is better than none, and adults who participate in any amount of physical activity gain some health benefits.   Behavioral modification strategies: increasing lean protein intake, decreasing simple carbohydrates, increasing vegetables, increasing water intake, decreasing liquid calories, no skipping meals, meal planning and cooking strategies, keeping healthy foods in the home, better snacking choices, avoiding temptations, and planning for success.  He was informed of the importance of frequent follow-up visits to maximize his success with intensive lifestyle modifications for his multiple health conditions. He was informed we would discuss his lab results at his next visit unless there is a critical issue that needs to be addressed sooner. Seth Fields agreed to keep his next visit at the agreed upon time to discuss these results.  Objective:   Blood pressure 104/72, pulse 94, temperature 97.6 F (36.4 C), height 5' 11"  (1.803 m), weight 210 lb (95.3 kg), SpO2 94 %. Body mass index is 29.29 kg/m.  EKG: Normal sinus rhythm, rate 99.  Indirect Calorimeter completed today shows a VO2 of 211 and a REE of 1454.  His calculated basal metabolic rate is 0569 thus his basal metabolic rate is worse than expected.  General: Cooperative, alert, well developed, in no acute distress. HEENT: Conjunctivae and lids unremarkable. Cardiovascular: Regular rhythm.  Lungs: Normal work of breathing. Neurologic: No focal deficits.   Lab Results  Component Value Date   CREATININE 1.05 06/18/2022   BUN 16 06/18/2022   NA 139 06/18/2022   K 4.8 06/18/2022   CL 101 06/18/2022   CO2 23 06/18/2022   Lab Results  Component Value Date   ALT 40 06/18/2022   AST 28 06/18/2022   ALKPHOS 86 06/18/2022   BILITOT 0.5 06/18/2022   Lab Results  Component Value Date   HGBA1C 5.0 06/18/2022   Lab Results  Component Value Date   INSULIN 23.4 06/18/2022   Lab Results   Component Value Date   TSH 2.000 06/18/2022   Lab Results  Component Value Date   CHOL 198 06/18/2022   HDL 41 06/18/2022   LDLCALC 118 (H) 06/18/2022   TRIG 223 (H) 06/18/2022   Lab Results  Component Value Date   WBC 10.2 01/27/2015   HGB 16.6 01/27/2015   HCT 48.0 01/27/2015   MCV 86.0 01/27/2015   PLT 303 01/27/2015   Attestation Statements:   Reviewed by clinician on day of visit: allergies, medications, problem list, medical history, surgical history, family history, social history, and previous encounter notes.  Time spent on visit including pre-visit chart review and post-visit charting and care was 40 minutes.   I, Kathlene November, BS, CMA, am acting as transcriptionist for Thomes Dinning, MD.  I have reviewed the above documentation for accuracy and completeness, and I agree with the above. -Thomes Dinning, MD

## 2022-07-02 ENCOUNTER — Encounter (INDEPENDENT_AMBULATORY_CARE_PROVIDER_SITE_OTHER): Payer: Self-pay | Admitting: Internal Medicine

## 2022-07-02 ENCOUNTER — Ambulatory Visit (INDEPENDENT_AMBULATORY_CARE_PROVIDER_SITE_OTHER): Payer: BC Managed Care – PPO | Admitting: Internal Medicine

## 2022-07-02 VITALS — BP 109/74 | HR 88 | Temp 97.6°F | Ht 71.0 in | Wt 208.8 lb

## 2022-07-02 DIAGNOSIS — E88819 Insulin resistance, unspecified: Secondary | ICD-10-CM

## 2022-07-02 DIAGNOSIS — K50919 Crohn's disease, unspecified, with unspecified complications: Secondary | ICD-10-CM | POA: Diagnosis not present

## 2022-07-02 DIAGNOSIS — Z6829 Body mass index (BMI) 29.0-29.9, adult: Secondary | ICD-10-CM | POA: Diagnosis not present

## 2022-07-02 DIAGNOSIS — E669 Obesity, unspecified: Secondary | ICD-10-CM | POA: Diagnosis not present

## 2022-07-02 NOTE — Progress Notes (Signed)
Interim history: Seth Fields presents today accompanied by his father.  He has lost 5 pounds in the last 6 weeks, 17 pounds since July.  He reports good adherence to meal plan and denies any difficulty.  His father feels that he is not getting enough protein at breakfast.  Seth Fields notes hunger signals in the afternoon but denies abnormal cravings.  If he feels hungry he is eating healthy snacks.  He has also eliminated soda and mostly drinks water and tea.  He will start strength training with a personal trainer in the next week or so.  He reports increase in energy since reduction in his Lamictal.  He reports his Crohn's disease is stable and no changes in medications have been made.  He remains on Imuran and Humira.  Seth Fields IC was 1454 which may be related to history of skipping meals but also use of CNS depressing drugs.

## 2022-07-08 DIAGNOSIS — F250 Schizoaffective disorder, bipolar type: Secondary | ICD-10-CM | POA: Diagnosis not present

## 2022-07-11 ENCOUNTER — Ambulatory Visit (INDEPENDENT_AMBULATORY_CARE_PROVIDER_SITE_OTHER): Payer: BC Managed Care – PPO | Admitting: Family Medicine

## 2022-07-11 ENCOUNTER — Encounter (HOSPITAL_BASED_OUTPATIENT_CLINIC_OR_DEPARTMENT_OTHER): Payer: Self-pay | Admitting: Family Medicine

## 2022-07-11 VITALS — BP 130/87 | HR 100 | Temp 97.6°F | Ht 71.0 in | Wt 217.6 lb

## 2022-07-11 DIAGNOSIS — E785 Hyperlipidemia, unspecified: Secondary | ICD-10-CM | POA: Diagnosis not present

## 2022-07-11 DIAGNOSIS — Z6830 Body mass index (BMI) 30.0-30.9, adult: Secondary | ICD-10-CM

## 2022-07-11 NOTE — Assessment & Plan Note (Signed)
As above, patient has been working on lifestyle modifications, has had evaluation with clinic with upcoming appointment scheduled.  Encouraged to continue with progress thus far, continue to work towards healthy, gradual weight loss. Will continue to monitor moving forward.  Encouraged to continue with regular follow-up with healthy weight loss clinic

## 2022-07-11 NOTE — Patient Instructions (Signed)
  Medication Instructions:  Your physician recommends that you continue on your current medications as directed. Please refer to the Current Medication list given to you today. --If you need a refill on any your medications before your next appointment, please call your pharmacy first. If no refills are authorized on file call the office.-- Lab Work: Your physician has recommended that you have lab work today: No If you have labs (blood work) drawn today and your tests are completely normal, you will receive your results via MyChart message OR a phone call from our staff.  Please ensure you check your voicemail in the event that you authorized detailed messages to be left on a delegated number. If you have any lab test that is abnormal or we need to change your treatment, we will call you to review the results.  Referrals/Procedures/Imaging: No  Follow-Up: Your next appointment:   Your physician recommends that you schedule a follow-up appointment in: 6 months cpe with Dr. de [place].  You will receive a text message or e-mail with a link to a survey about your care and experience with us today! We would greatly appreciate your feedback!   Thanks for letting us be apart of your health journey!!  Primary Care and [facility] Medicine   Dr. Raymond de [place]   We encourage you to activate your patient portal called "MyChart".  Sign up information is provided on this After Visit Summary.  MyChart is used to connect with patients for Virtual Visits (Telemedicine).  Patients are able to view lab/test results, encounter notes, upcoming appointments, etc.  Non-urgent messages can be sent to your provider as well. To learn more about what you can do with MyChart, please visit --  [email or web address].    

## 2022-07-11 NOTE — Progress Notes (Signed)
    Procedures performed today:    None.  Independent interpretation of notes and tests performed by another provider:   None.  Brief History, Exam, Impression, and Recommendations:    BP 130/87 (BP Location: Right Arm, Patient Position: Sitting, Cuff Size: Large)   Pulse 100   Temp 97.6 F (36.4 C) (Oral)   Ht 5\' 11"  (1.803 m)   Wt 217 lb 9.6 oz (98.7 kg)   SpO2 100%   BMI 30.35 kg/m   Hyperlipidemia Patient has been working on lifestyle modifications.  He was able to establish with healthy weight and wellness clinic and reports that this has been going fairly well.  He has been adjusting dietary intake as well as working to increase physical activity level.  Mom and patient do report that he still tends to snack at times.  In reviewing chart, since our last appointment about 6 months ago, patient has had some weight loss noted.  He does note that his weight on our scale here is higher than at healthy weight and wellness clinic where they will have patient remove shoes and other items to obtain more accurate weight Recent labs completed through healthy weight and wellness clinic did show improved cholesterol numbers. Congratulated patient on current progress.  Encouraged patient to continue with lifestyle modifications.  We will continue to monitor lipid panel intermittently moving forward  BMI 30.0-30.9,adult As above, patient has been working on lifestyle modifications, has had evaluation with clinic with upcoming appointment scheduled.  Encouraged to continue with progress thus far, continue to work towards healthy, gradual weight loss. Will continue to monitor moving forward.  Encouraged to continue with regular follow-up with healthy weight loss clinic  Return in about 6 months (around 01/09/2023) for CPE. He will have labs completed with GI provider, we will review labs at time of CPE and determine if any additional labs are  needed   ___________________________________________ Katera Rybka de [place], MD, ABFM, CAQSM Primary Care and [place]

## 2022-07-11 NOTE — Assessment & Plan Note (Signed)
Patient has been working on lifestyle modifications.  He was able to establish with healthy weight and wellness clinic and reports that this has been going fairly well.  He has been adjusting dietary intake as well as working to increase physical activity level.  Mom and patient do report that he still tends to snack at times.  In reviewing chart, since our last appointment about 6 months ago, patient has had some weight loss noted.  He does note that his weight on our scale here is higher than at healthy weight and wellness clinic where they will have patient remove shoes and other items to obtain more accurate weight Recent labs completed through healthy weight and wellness clinic did show improved cholesterol numbers. Congratulated patient on current progress.  Encouraged patient to continue with lifestyle modifications.  We will continue to monitor lipid panel intermittently moving forward

## 2022-07-19 NOTE — Progress Notes (Unsigned)
Chief Complaint:   OBESITY Ermal is here to discuss his progress with his obesity treatment plan along with follow-up of his obesity related diagnoses. Talal is on the Category 2 Plan and states he is following his eating plan approximately 90% of the time. Genevieve states he is playing Tennis and on the treadmill for 60 minutes 2 times per week.  Today's visit was #: 2 Starting weight: 210 lbs Starting date: 06/18/2022 Today's weight: 208 lbs Today's date: 07/02/2022 Total lbs lost to date: 2 Total lbs lost since last in-office visit: 2  Interim History: Hurschel presents today accompanied by his father.  He has lost 5 pounds in the last 6 weeks, 17 pounds since July.  He reports good adherence to meal plan and denies any difficulty.  His father feels that he is not getting enough protein at breakfast.  Arnette Norris notes hunger signals in the afternoon but denies abnormal cravings.  If he feels hungry he is eating healthy snacks.  He has also eliminated soda and mostly drinks water and tea.  He will start strength training with a personal trainer in the next week or so.  He reports increase in energy since reduction in his Lamictal.  He reports his Crohn's disease is stable and no changes in medications have been made.  He remains on Imuran and Humira.  Hardin Negus IC was 1454 which may be related to history of skipping meals but also use of CNS depressing drugs.  Subjective:   1. Crohn's disease with complication, unspecified gastrointestinal tract location [facility]) Reviewed recent consult. Eann is stable on Imuran and Humira.   2. Insulin resistance Recent insulin level was 23.4, and A1c and fasting blood glucose were within normal limits. No abnormal cravings, may result in fatigue. I discussed labs with the patient today.   Assessment/Plan:   1. Crohn's disease with complication, unspecified gastrointestinal tract location [facility]) Britney will continue his immunosuppressants.   2. Insulin  resistance Tahmid will continue with his weight loss therapy, and decrease simple sugars. Consider metformin to offset antipsychotics related to weight gain.   3. Obesity with current BMI 29.1 Burke is currently in the action stage of change. As such, his goal is to continue with weight loss efforts. He has agreed to the Category 2 Plan.   Exercise goals: As is, and add strength training 2-3 times a week.   Behavioral modification strategies: increasing lean protein intake, decreasing simple carbohydrates, increasing water intake, no skipping meals, meal planning and cooking strategies, and planning for success.  Tashan has agreed to follow-up with our clinic in 4 weeks. He was informed of the importance of frequent follow-up visits to maximize his success with intensive lifestyle modifications for his multiple health conditions.   Objective:   Blood pressure 109/74, pulse 88, temperature 97.6 F (36.4 C), height 5\' 11"  (1.803 m), weight 208 lb 12.8 oz (94.7 kg), SpO2 96 %. Body mass index is 29.12 kg/m.  General: Cooperative, alert, well developed, in no acute distress. HEENT: Conjunctivae and lids unremarkable. Cardiovascular: Regular rhythm.  Lungs: Normal work of breathing. Neurologic: No focal deficits.   Lab Results  Component Value Date   CREATININE 1.05 06/18/2022   BUN 16 06/18/2022   NA 139 06/18/2022   K 4.8 06/18/2022   CL 101 06/18/2022   CO2 23 06/18/2022   Lab Results  Component Value Date   ALT 40 06/18/2022   AST 28 06/18/2022   ALKPHOS 86 06/18/2022   BILITOT 0.5  06/18/2022   Lab Results  Component Value Date   HGBA1C 5.0 06/18/2022   Lab Results  Component Value Date   INSULIN 23.4 06/18/2022   Lab Results  Component Value Date   TSH 2.000 06/18/2022   Lab Results  Component Value Date   CHOL 198 06/18/2022   HDL 41 06/18/2022   LDLCALC 118 (H) 06/18/2022   TRIG 223 (H) 06/18/2022   Lab Results  Component Value Date   VD25OH 38.6  06/18/2022   Lab Results  Component Value Date   WBC 10.2 01/27/2015   HGB 16.6 01/27/2015   HCT 48.0 01/27/2015   MCV 86.0 01/27/2015   PLT 303 01/27/2015   No results found for: "IRON", "TIBC", "FERRITIN"  Attestation Statements:   Reviewed by clinician on day of visit: allergies, medications, problem list, medical history, surgical history, family history, social history, and previous encounter notes.  Time spent on visit including pre-visit chart review and post-visit care and charting was 30 minutes.   Wilhemena Durie, am acting as transcriptionist for Thomes Dinning, MD.  I have reviewed the above documentation for accuracy and completeness, and I agree with the above. -Thomes Dinning, MD

## 2022-07-23 DIAGNOSIS — F250 Schizoaffective disorder, bipolar type: Secondary | ICD-10-CM | POA: Diagnosis not present

## 2022-07-31 ENCOUNTER — Ambulatory Visit (INDEPENDENT_AMBULATORY_CARE_PROVIDER_SITE_OTHER): Payer: BC Managed Care – PPO | Admitting: Internal Medicine

## 2022-07-31 ENCOUNTER — Encounter (INDEPENDENT_AMBULATORY_CARE_PROVIDER_SITE_OTHER): Payer: Self-pay | Admitting: Internal Medicine

## 2022-07-31 VITALS — BP 132/84 | HR 105 | Temp 97.9°F | Ht 71.0 in | Wt 212.0 lb

## 2022-07-31 DIAGNOSIS — K50919 Crohn's disease, unspecified, with unspecified complications: Secondary | ICD-10-CM | POA: Diagnosis not present

## 2022-07-31 DIAGNOSIS — E88819 Insulin resistance, unspecified: Secondary | ICD-10-CM

## 2022-07-31 DIAGNOSIS — E66811 Obesity, class 1: Secondary | ICD-10-CM

## 2022-07-31 DIAGNOSIS — E669 Obesity, unspecified: Secondary | ICD-10-CM

## 2022-07-31 DIAGNOSIS — E7800 Pure hypercholesterolemia, unspecified: Secondary | ICD-10-CM | POA: Diagnosis not present

## 2022-07-31 DIAGNOSIS — Z6829 Body mass index (BMI) 29.0-29.9, adult: Secondary | ICD-10-CM

## 2022-07-31 NOTE — Assessment & Plan Note (Signed)
His lipid panel showed an LDL of 118 and triglycerides of 223.  This may be a result of spillover effect from adiposity and also nutritional.  I anticipate improvements with weight loss.  We will check a fasting lipid profile in 6 months.  He does not warrant pharmacotherapy at present time based on risk

## 2022-07-31 NOTE — Progress Notes (Signed)
Office: [phone]  /  Fax: [place] [facility] Height: 5\' 11"  (1.803 m) Weight: 212 lb (96.2 kg) Temp: 97.9 F (36.6 C) Pulse Rate: (!) 105 BP: 132/84 SpO2: 98 % Fasting: no Labs: no Today's Visit #: 3 Weight at Last VIsit: 208 lb Weight Lost Since Last Visit: 0 lb  Body Fat %: 26.8 % Fat Mass (lbs): 57 lbs Muscle Mass (lbs): 148.2 lbs Total Body Water (lbs): 105.6 lbs Visceral Fat Rating : 10 Peak Weight: 227 lb Starting Date: 06/17/22 Starting Weight: 210 lb Total Weight Loss (lbs): 0 lb (0 kg)    HPI  Chief Complaint: OBESITY  Seth Fields is here to discuss his progress with his obesity treatment plan.  He is compliance with meal plan is fair and he is also journaling.  He states he is walking, treadmill, strength and playing tennis for 60-90 minutes 5 times per week.   Interval History: Since last office he reports doing well has begun strength training.  He remains weight neutral he denies problems with satiety, hunger signals or abnormal cravings.  He has been making smoothies but these are prepared for him so not sure of how many calories.  He is also snacking on Cheerios.  He has reduced soda's.  Pharmacotherapy: None  PHYSICAL EXAM:  Blood pressure 132/84, pulse (!) 105, temperature 97.9 F (36.6 C), height 5\' 11"  (1.803 m), SpO2 98 %. Body mass index is 30.35 kg/m.  General: He is overweight, cooperative, alert, well developed, and in no acute distress. PSYCH: Has normal mood, affect and thought process.   HEENT: EOMI, sclerae are anicteric. Lungs: Normal breathing effort, no conversational dyspnea. Extremities: No edema.  Neurologic: No gross sensory or motor deficits. No tremors or fasciculations noted.    DIAGNOSTIC DATA REVIEWED:  BMET    Component Value Date/Time   NA 139 06/18/2022 0919   K 4.8 06/18/2022 0919   CL 101 06/18/2022 0919   CO2 23 06/18/2022 0919   GLUCOSE 93 06/18/2022 0919    GLUCOSE 98 01/27/2015 1715   BUN 16 06/18/2022 0919   CREATININE 1.05 06/18/2022 0919   CREATININE 1.28 08/08/2012 1555   CALCIUM 10.2 06/18/2022 0919   GFRNONAA >60 01/27/2015 1715   GFRAA >60 01/27/2015 1715   Lab Results  Component Value Date   HGBA1C 5.0 06/18/2022   Lab Results  Component Value Date   INSULIN 23.4 06/18/2022   CBC    Component Value Date/Time   WBC 10.2 01/27/2015 1715   RBC 5.58 01/27/2015 1715   HGB 16.6 01/27/2015 1715   HCT 48.0 01/27/2015 1715   PLT 303 01/27/2015 1715   MCV 86.0 01/27/2015 1715   MCV 79.1 (A) 08/08/2012 1611   MCH 29.7 01/27/2015 1715   MCHC 34.6 01/27/2015 1715   RDW 13.8 01/27/2015 1715   Iron/TIBC/Ferritin/ %Sat No results found for: "IRON", "TIBC", "FERRITIN", "IRONPCTSAT" Lipid Panel     Component Value Date/Time   CHOL 198 06/18/2022 0919   TRIG 223 (H) 06/18/2022 0919   HDL 41 06/18/2022 0919   LDLCALC 118 (H) 06/18/2022 0919   Hepatic Function Panel     Component Value Date/Time   PROT 7.3 06/18/2022 0919   ALBUMIN 4.8 06/18/2022 0919   AST 28 06/18/2022 0919   ALT 40 06/18/2022 0919   ALKPHOS 86 06/18/2022 0919   BILITOT 0.5 06/18/2022 0919   BILIDIR <0.1 07/24/2011 2332   IBILI NOT CALCULATED 07/24/2011 2332  Component Value Date/Time   TSH 2.000 06/18/2022 0919     ASSESSMENT AND PLAN  TREATMENT PLAN FOR OBESITY:  Recommended Dietary Goals  Jacorian is currently in the action stage of change. As such, his goal is to continue with weight loss efforts. He has agreed to the Category 2 Plan and keeping a food journal and adhering to recommended goals of 1200 cal calories and 80 to 90 g protein.  Behavioral Intervention  We discussed the following Behavioral Modification Strategies today: increasing lean protein intake, no skipping meals, meal planning and cooking strategies, better snacking choices, and keeping a strict food journal.  Additional resources provided today: NA  Recommended  Physical Activity Goals  Aceton has been instructed to work up to 150 minutes of moderate intensity aerobic activity a week and strengthening exercises 2-3 times per week for cardiovascular health, weight loss maintenance and preservation of muscle mass.   He has agreed to increase physical activity in their day and reduce sedentary time (increase NEAT).  and continue physical activity as is.    Pharmacotherapy We discussed various medication options to help Ulrick with his weight loss efforts and we both agreed to consider metformin.  ASSOCIATED CONDITIONS ADDRESSED TODAY  Pure hypercholesterolemia Assessment & Plan: His lipid panel showed an LDL of 118 and triglycerides of 223.  This may be a result of spillover effect from adiposity and also nutritional.  I anticipate improvements with weight loss.  We will check a fasting lipid profile in 6 months.  He does not warrant pharmacotherapy at present time based on risk    Insulin resistance Assessment & Plan: Insulin levels were 24.  This is associated with cravings and also fatigue.  In addition to reduced calorie balanced nutritional plan and reducing simple sugars he may benefit from incretin effects of metformin.  Patient will review medication and make a decision whether he wants to do a trial or not by the next office visit.   Obesity with current BMI 29.1  Crohn's disease with complication, unspecified gastrointestinal tract location [facility]) Assessment & Plan: Patient has Crohn's disease which appears to be stable.  We discussed the possibility of using metformin to assist with weight loss efforts and also reviewed study suggesting that medication may be beneficial in inflammatory bowel disease due to improvements in dysbiosis.  Patient will talk to his gastroenterologist about using metformin to help him with weight loss.       Return in about 2 weeks (around 08/14/2022) for For Weight Mangement with Dr. Gerarda Fraction.Marland Kitchen He was  informed of the importance of frequent follow up visits to maximize his success with intensive lifestyle modifications for his multiple health conditions.   ATTESTASTION STATEMENTS:  Reviewed by clinician on day of visit: allergies, medications, problem list, medical history, surgical history, family history, social history, and previous encounter notes.   Time spent on visit including pre-visit chart review and post-visit care and charting was 30 minutes.    Thomes Dinning, MD

## 2022-07-31 NOTE — Progress Notes (Signed)
error 

## 2022-07-31 NOTE — Assessment & Plan Note (Addendum)
Insulin levels were 24.  This is associated with cravings and also fatigue.  In addition to reduced calorie balanced nutritional plan and reducing simple sugars he may benefit from incretin effects of metformin.  Patient will review medication and make a decision whether he wants to do a trial or not by the next office visit.

## 2022-07-31 NOTE — Assessment & Plan Note (Signed)
Patient has Crohn's disease which appears to be stable.  We discussed the possibility of using metformin to assist with weight loss efforts and also reviewed study suggesting that medication may be beneficial in inflammatory bowel disease due to improvements in dysbiosis.  Patient will talk to his gastroenterologist about using metformin to help him with weight loss.

## 2022-08-04 ENCOUNTER — Telehealth (INDEPENDENT_AMBULATORY_CARE_PROVIDER_SITE_OTHER): Payer: Self-pay | Admitting: Internal Medicine

## 2022-08-04 DIAGNOSIS — F250 Schizoaffective disorder, bipolar type: Secondary | ICD-10-CM | POA: Diagnosis not present

## 2022-08-04 NOTE — Telephone Encounter (Signed)
Patient mother stated he is has been approved/cleared for metformin. It is not going to interfere with any other medications. Please call patient to discuss, if you have any questions. Mother Webb Silversmith) phone number [phone], call after 12 noon.

## 2022-08-05 ENCOUNTER — Other Ambulatory Visit (INDEPENDENT_AMBULATORY_CARE_PROVIDER_SITE_OTHER): Payer: Self-pay | Admitting: Internal Medicine

## 2022-08-05 DIAGNOSIS — E88819 Insulin resistance, unspecified: Secondary | ICD-10-CM

## 2022-08-05 MED ORDER — METFORMIN HCL ER 500 MG PO TB24: 500.0000 mg | ORAL_TABLET | Freq: Every day | ORAL | 0 refills | Status: DC

## 2022-08-05 NOTE — Telephone Encounter (Signed)
Per patient's mother, at the last OV Dr. Jerilynn Mages wanted patient to make sure it was ok for him to take Metformin. Patient's mother stated she talked with the pharmacist and he told them the Metformin would not interact with any of his other medications. It would be ok for patient to be prescribed Metformin if Dr. Gerarda Fraction still want him to take it, if agreeable please send prescription to [facility]. Please advise

## 2022-08-05 NOTE — Telephone Encounter (Signed)
Patient mother stated he is has been approved/cleared for metformin. It is not going to interfere with any other medications. Please call patient to discuss, if you have any questions. Mother Webb Silversmith) phone number [phone], call after 12 noon.

## 2022-08-06 DIAGNOSIS — F250 Schizoaffective disorder, bipolar type: Secondary | ICD-10-CM | POA: Diagnosis not present

## 2022-08-06 DIAGNOSIS — Z79899 Other long term (current) drug therapy: Secondary | ICD-10-CM | POA: Diagnosis not present

## 2022-08-18 ENCOUNTER — Ambulatory Visit (INDEPENDENT_AMBULATORY_CARE_PROVIDER_SITE_OTHER): Payer: BC Managed Care – PPO | Admitting: Internal Medicine

## 2022-08-18 ENCOUNTER — Encounter (INDEPENDENT_AMBULATORY_CARE_PROVIDER_SITE_OTHER): Payer: Self-pay | Admitting: Internal Medicine

## 2022-08-18 VITALS — BP 122/79 | HR 95 | Temp 98.1°F | Ht 71.0 in | Wt 213.0 lb

## 2022-08-18 DIAGNOSIS — E88819 Insulin resistance, unspecified: Secondary | ICD-10-CM | POA: Diagnosis not present

## 2022-08-18 DIAGNOSIS — F250 Schizoaffective disorder, bipolar type: Secondary | ICD-10-CM | POA: Diagnosis not present

## 2022-08-18 DIAGNOSIS — Z6829 Body mass index (BMI) 29.0-29.9, adult: Secondary | ICD-10-CM | POA: Diagnosis not present

## 2022-08-18 DIAGNOSIS — Z79899 Other long term (current) drug therapy: Secondary | ICD-10-CM | POA: Insufficient documentation

## 2022-08-18 DIAGNOSIS — E669 Obesity, unspecified: Secondary | ICD-10-CM | POA: Diagnosis not present

## 2022-08-18 MED ORDER — METFORMIN HCL ER 500 MG PO TB24: 500.0000 mg | ORAL_TABLET | Freq: Every day | ORAL | 0 refills | Status: DC

## 2022-08-18 NOTE — Assessment & Plan Note (Signed)
His HOMA-IR is 5.28 which is severe. Optimal level < 1.9. This is complex condition associated with genetics, ectopic fat and lifestyle factors. Insulin resistance may result in weight gain, abnormal cravings (particularly for carbs) and fatigue. This may result in additional weight gain and lead to pre-diabetes and diabetes if untreated.   Lab Results  Component Value Date   HGBA1C 5.0 06/18/2022   Lab Results  Component Value Date   INSULIN 23.4 06/18/2022   Lab Results  Component Value Date   GLUCOSE 93 06/18/2022   GLUCOSE 102 (H) 06/20/2011    I recommend ongoing weight loss therapy (10% of TBW), increasing physical activity, reducing processed, simple sugars and saturated fats in diet.  In addition he will continue metformin 500 mg a day.

## 2022-08-18 NOTE — Assessment & Plan Note (Signed)
Patient on several medications that may contribute to weight gain and affecting weight loss efforts.  Continue metformin to offset these effects.

## 2022-08-18 NOTE — Progress Notes (Signed)
Office: [phone]  /  Fax: [place] [facility] Height: 5' 11"$  (1.803 m) Weight: 213 lb (96.6 kg) Temp: 98.1 F (36.7 C) Pulse Rate: 95 BP: 122/79 SpO2: 98 % Fasting: n Labs: n Today's Visit #: 4 Weight at Last VIsit: 212 lb Weight Lost Since Last Visit: +1  Body Fat %: 26.3 % Fat Mass (lbs): 56 lbs Muscle Mass (lbs): 149.4 lbs Total Body Water (lbs): 106.4 lbs Visceral Fat Rating : 10 Peak Weight: 227 lb Starting Date: 06/17/22 Starting Weight: 210 lb Total Weight Loss (lbs): 0 lb (0 kg)    HPI  Chief Complaint: OBESITY  Seth Fields is here to discuss his progress with his obesity treatment plan. He is on the keeping a food journal and adhering to recommended goals of 1200 calories and 80-90 protein and states he is following his eating plan approximately 80 % of the time. He states he is exercising 60 minutes 6-7 times per week.   Interval History:  Since last office visit he gained 1 pound.  He has been working out and has overall increased levels of physical activity.  His father has been making for him protein fortified fruit smoothies and arranging for healthier lunch meals.  He still notes some unhealthy snacking but low calories mostly foods high in simple sugars.  Since starting metformin he noticed that his mood has improved and also has noticed decrease snacking.  He denies side effects to medication.  He has also increased water consumption   Pharmacotherapy: Metformin for incretin effect  PHYSICAL EXAM:  Blood pressure 122/79, pulse 95, temperature 98.1 F (36.7 C), height 5' 11"$  (1.803 m), weight 213 lb (96.6 kg), SpO2 98 %. Body mass index is 29.71 kg/m.  General: He is overweight, cooperative, alert, well developed, and in no acute distress. PSYCH: Has normal mood, affect and thought process.   HEENT: EOMI, sclerae are anicteric. Lungs: Normal breathing effort, no conversational dyspnea. Extremities: No  edema.  Neurologic: No gross sensory or motor deficits. No tremors or fasciculations noted.    DIAGNOSTIC DATA REVIEWED:  BMET    Component Value Date/Time   NA 139 06/18/2022 0919   K 4.8 06/18/2022 0919   CL 101 06/18/2022 0919   CO2 23 06/18/2022 0919   GLUCOSE 93 06/18/2022 0919   GLUCOSE 98 01/27/2015 1715   BUN 16 06/18/2022 0919   CREATININE 1.05 06/18/2022 0919   CREATININE 1.28 08/08/2012 1555   CALCIUM 10.2 06/18/2022 0919   GFRNONAA >60 01/27/2015 1715   GFRAA >60 01/27/2015 1715   Lab Results  Component Value Date   HGBA1C 5.0 06/18/2022   Lab Results  Component Value Date   INSULIN 23.4 06/18/2022   Lab Results  Component Value Date   TSH 2.000 06/18/2022   CBC    Component Value Date/Time   WBC 10.2 01/27/2015 1715   RBC 5.58 01/27/2015 1715   HGB 16.6 01/27/2015 1715   HCT 48.0 01/27/2015 1715   PLT 303 01/27/2015 1715   MCV 86.0 01/27/2015 1715   MCV 79.1 (A) 08/08/2012 1611   MCH 29.7 01/27/2015 1715   MCHC 34.6 01/27/2015 1715   RDW 13.8 01/27/2015 1715   Iron Studies No results found for: "IRON", "TIBC", "FERRITIN", "IRONPCTSAT" Lipid Panel     Component Value Date/Time   CHOL 198 06/18/2022 0919   TRIG 223 (H) 06/18/2022 0919   HDL 41 06/18/2022 0919   LDLCALC 118 (H) 06/18/2022 0919  Hepatic Function Panel     Component Value Date/Time   PROT 7.3 06/18/2022 0919   ALBUMIN 4.8 06/18/2022 0919   AST 28 06/18/2022 0919   ALT 40 06/18/2022 0919   ALKPHOS 86 06/18/2022 0919   BILITOT 0.5 06/18/2022 0919   BILIDIR <0.1 07/24/2011 2332   IBILI NOT CALCULATED 07/24/2011 2332      Component Value Date/Time   TSH 2.000 06/18/2022 0919   Nutritional Lab Results  Component Value Date   VD25OH 38.6 06/18/2022     ASSESSMENT AND PLAN  TREATMENT PLAN FOR OBESITY:  Recommended Dietary Goals  Navarre is currently in the action stage of change. As such, his goal is to continue weight management plan. He has agreed to the  Category 2 Plan.  Behavioral Intervention  We discussed the following Behavioral Modification Strategies today: increasing lean protein intake, decreasing simple carbohydrates , increasing vegetables, increasing fiber rich foods, increase water intake, work on meal planning and easy cooking plans, think about ways to increase physical activity, reading food labels and making healthy choices when eating convenient foods, and avoiding temptations.  Additional resources provided today: NA  Recommended Physical Activity Goals  Gaylord has been advised to work up to 150 minutes of moderate intensity aerobic activity a week and strengthening exercises 2-3 times per week for cardiovascular health, weight loss maintenance and preservation of muscle mass.   He has agreed to continue physical activity as is.    Pharmacotherapy We discussed various medication options to help Oneal with his weight loss efforts and we both agreed to continuing metformin at current dose.  ASSOCIATED CONDITIONS ADDRESSED TODAY  Obesity with current BMI 29.1  Insulin resistance Assessment & Plan: His HOMA-IR is 5.28 which is severe. Optimal level < 1.9. This is complex condition associated with genetics, ectopic fat and lifestyle factors. Insulin resistance may result in weight gain, abnormal cravings (particularly for carbs) and fatigue. This may result in additional weight gain and lead to pre-diabetes and diabetes if untreated.   Lab Results  Component Value Date   HGBA1C 5.0 06/18/2022   Lab Results  Component Value Date   INSULIN 23.4 06/18/2022   Lab Results  Component Value Date   GLUCOSE 93 06/18/2022   GLUCOSE 102 (H) 06/20/2011    I recommend ongoing weight loss therapy (10% of TBW), increasing physical activity, reducing processed, simple sugars and saturated fats in diet.  In addition he will continue metformin 500 mg a day.   Orders: -     metFORMIN HCl ER; Take 1 tablet (500 mg total) by  mouth daily with breakfast.  Dispense: 30 tablet; Refill: 0  Polypharmacy Assessment & Plan: Patient on several medications that may contribute to weight gain and affecting weight loss efforts.  Continue metformin to offset these effects.       Return in about 2 weeks (around 09/01/2022) for For Weight Mangement with Dr. Gerarda Fraction.Marland Kitchen He was informed of the importance of frequent follow up visits to maximize his success with intensive lifestyle modifications for his multiple health conditions.   ATTESTASTION STATEMENTS:  Reviewed by clinician on day of visit: allergies, medications, problem list, medical history, surgical history, family history, social history, and previous encounter notes.      Thomes Dinning, MD

## 2022-08-20 DIAGNOSIS — F250 Schizoaffective disorder, bipolar type: Secondary | ICD-10-CM | POA: Diagnosis not present

## 2022-08-21 ENCOUNTER — Telehealth (HOSPITAL_BASED_OUTPATIENT_CLINIC_OR_DEPARTMENT_OTHER): Payer: Self-pay | Admitting: Family Medicine

## 2022-08-21 NOTE — Telephone Encounter (Signed)
Seth Fields stopped by to drop off immunizations and to see if PT needed any additional vaccinations as he is traveling to [place], [place]

## 2022-08-28 ENCOUNTER — Ambulatory Visit (INDEPENDENT_AMBULATORY_CARE_PROVIDER_SITE_OTHER): Payer: BC Managed Care – PPO | Admitting: Internal Medicine

## 2022-08-28 ENCOUNTER — Encounter (INDEPENDENT_AMBULATORY_CARE_PROVIDER_SITE_OTHER): Payer: Self-pay | Admitting: Internal Medicine

## 2022-08-28 VITALS — BP 117/72 | HR 78 | Temp 98.7°F | Ht 71.0 in | Wt 214.0 lb

## 2022-08-28 DIAGNOSIS — E669 Obesity, unspecified: Secondary | ICD-10-CM

## 2022-08-28 DIAGNOSIS — Z6829 Body mass index (BMI) 29.0-29.9, adult: Secondary | ICD-10-CM | POA: Diagnosis not present

## 2022-08-28 DIAGNOSIS — E88819 Insulin resistance, unspecified: Secondary | ICD-10-CM

## 2022-08-28 DIAGNOSIS — F250 Schizoaffective disorder, bipolar type: Secondary | ICD-10-CM | POA: Diagnosis not present

## 2022-08-28 MED ORDER — METFORMIN HCL ER 500 MG PO TB24: 500.0000 mg | ORAL_TABLET | Freq: Two times a day (BID) | ORAL | 0 refills | Status: DC

## 2022-08-28 NOTE — Progress Notes (Signed)
Office: [phone]  /  Fax: [place] [facility] Height: 5' 11"$  (1.803 m) Weight: 214 lb (97.1 kg) Temp: 98.7 F (37.1 C) Pulse Rate: 78 BP: 117/72 SpO2: 98 % Fasting: No Labs: No Today's Visit #: 5 Weight at Last VIsit: 213 lb Weight Lost Since Last Visit: +1  Body Fat %: 26.9 % Fat Mass (lbs): 57.8 lbs Muscle Mass (lbs): 149.2 lbs Total Body Water (lbs): 101.6 lbs Visceral Fat Rating : 10 Peak Weight: 227 lb Starting Date: 06/17/22 Starting Weight: 210 lb Total Weight Loss (lbs): 0 lb (0 kg)    HPI  Chief Complaint: OBESITY  Seth Fields is here to discuss his progress with his obesity treatment plan. He is on the 1200 calories and 90 grams daily and states he is following his eating plan approximately 80 % of the time. He states he is exercising 60 minutes 5 times per week.   Interval History:  Since last office visit he has gained 1 lb. He is accompanied by his father who assist with meal prep and planning.  Jamespaul has not journaled about been keeping track of calories.  He is currently on metformin and without side effects.  He is engaging in good amount of exercise.  When we checked his metabolism his metabolism was lower than predicted.  He is also on several psychotropic medication known to cause weight gain and metabolic derangements.  This will make his weight loss a little bit more challenging.    Pharmacotherapy: Metformin with primary indication for insulin resistance and also offset weight gain associated with antipsychotic use.  PHYSICAL EXAM:  Blood pressure 117/72, pulse 78, temperature 98.7 F (37.1 C), height 5' 11"$  (1.803 m), weight 214 lb (97.1 kg), SpO2 98 %. Body mass index is 29.85 kg/m.  General: He is overweight, cooperative, alert, well developed, and in no acute distress. PSYCH: Has normal mood, affect and thought process.   HEENT: EOMI, sclerae are anicteric. Lungs: Normal breathing effort,  no conversational dyspnea. Extremities: No edema.  Neurologic: No gross sensory or motor deficits. No tremors or fasciculations noted.    ASSESSMENT AND PLAN  TREATMENT PLAN FOR OBESITY:  Recommended Dietary Goals  Mosheh is currently in the action stage of change. As such, his goal is to continue weight management plan. He has agreed to the Category 2 Plan and keeping a food journal and adhering to recommended goals of 1200 calories and 90 protein.  Behavioral Intervention  We discussed the following Behavioral Modification Strategies today: increasing lean protein intake, decreasing simple carbohydrates , increasing vegetables, increase water intake, work on meal planning and easy cooking plans, avoid or reduce consumption of processed foods, reading food labels and making healthy choices when eating convenient foods, and work on tracking and journaling calories using tracking App.  Additional resources provided today: He was given handout for journaling using my fitness pal.  I will help him set up parameters at the next office visit.  Recommended Physical Activity Goals  Taquon has been advised to work up to 150 minutes of moderate intensity aerobic activity a week and strengthening exercises 2-3 times per week for cardiovascular health, weight loss maintenance and preservation of muscle mass.  He will continue with current physical activity levels and aim for above targets.    Pharmacotherapy We discussed various medication options to help Essien with his weight loss efforts and we both agreed to increase metformin to 500 mg twice daily for incretin effect  and assistance with weight loss and insulin resistance.  ASSOCIATED CONDITIONS ADDRESSED TODAY  Obesity with current BMI 29.1  Insulin resistance Assessment & Plan: His HOMA-IR is 5.28 which is significant. Optimal level < 1.9. This is complex condition associated with genetics, ectopic fat and lifestyle factors. Insulin  resistance may result in weight gain, abnormal cravings (particularly for carbs) and fatigue. This may result in additional weight gain and lead to pre-diabetes and diabetes if untreated.   Lab Results  Component Value Date   HGBA1C 5.0 06/18/2022   Lab Results  Component Value Date   INSULIN 23.4 06/18/2022   Lab Results  Component Value Date   GLUCOSE 93 06/18/2022   GLUCOSE 102 (H) 06/20/2011    I recommend ongoing weight loss therapy (10% of TBW), increasing physical activity, reducing processed, simple sugars and saturated fats in diet.  In addition we will increase metformin to 500 mg twice a day.  He is tolerating medication well without any adverse effects   Orders: -     metFORMIN HCl ER; Take 1 tablet (500 mg total) by mouth 2 (two) times daily with a meal.  Dispense: 60 tablet; Refill: 0  Schizoaffective disorder, bipolar type ([place]) Assessment & Plan: Stable.  On Cymbalta, clonazepam, Abilify, Adderall, lurasidone some may contribute to weight gain and insulin resistance       DIAGNOSTIC DATA REVIEWED:  BMET    Component Value Date/Time   NA 139 06/18/2022 0919   K 4.8 06/18/2022 0919   CL 101 06/18/2022 0919   CO2 23 06/18/2022 0919   GLUCOSE 93 06/18/2022 0919   GLUCOSE 98 01/27/2015 1715   BUN 16 06/18/2022 0919   CREATININE 1.05 06/18/2022 0919   CREATININE 1.28 08/08/2012 1555   CALCIUM 10.2 06/18/2022 0919   GFRNONAA >60 01/27/2015 1715   GFRAA >60 01/27/2015 1715   Lab Results  Component Value Date   HGBA1C 5.0 06/18/2022   Lab Results  Component Value Date   INSULIN 23.4 06/18/2022   Lab Results  Component Value Date   TSH 2.000 06/18/2022   CBC    Component Value Date/Time   WBC 10.2 01/27/2015 1715   RBC 5.58 01/27/2015 1715   HGB 16.6 01/27/2015 1715   HCT 48.0 01/27/2015 1715   PLT 303 01/27/2015 1715   MCV 86.0 01/27/2015 1715   MCV 79.1 (A) 08/08/2012 1611   MCH 29.7 01/27/2015 1715   MCHC 34.6 01/27/2015 1715   RDW 13.8  01/27/2015 1715   Iron Studies No results found for: "IRON", "TIBC", "FERRITIN", "IRONPCTSAT" Lipid Panel     Component Value Date/Time   CHOL 198 06/18/2022 0919   TRIG 223 (H) 06/18/2022 0919   HDL 41 06/18/2022 0919   LDLCALC 118 (H) 06/18/2022 0919   Hepatic Function Panel     Component Value Date/Time   PROT 7.3 06/18/2022 0919   ALBUMIN 4.8 06/18/2022 0919   AST 28 06/18/2022 0919   ALT 40 06/18/2022 0919   ALKPHOS 86 06/18/2022 0919   BILITOT 0.5 06/18/2022 0919   BILIDIR <0.1 07/24/2011 2332   IBILI NOT CALCULATED 07/24/2011 2332      Component Value Date/Time   TSH 2.000 06/18/2022 0919   Nutritional Lab Results  Component Value Date   VD25OH 38.6 06/18/2022      Return in about 3 weeks (around 09/18/2022).Marland Kitchen He was informed of the importance of frequent follow up visits to maximize his success with intensive lifestyle modifications for his multiple health conditions.  ATTESTASTION STATEMENTS:  Reviewed by clinician on day of visit: allergies, medications, problem list, medical history, surgical history, family history, social history, and previous encounter notes.   Time spent on visit including pre-visit chart review and post-visit care and charting was 30 minutes.    Thomes Dinning, MD

## 2022-08-28 NOTE — Assessment & Plan Note (Signed)
Stable.  On Cymbalta, clonazepam, Abilify, Adderall, lurasidone some may contribute to weight gain and insulin resistance

## 2022-08-28 NOTE — Assessment & Plan Note (Signed)
His HOMA-IR is 5.28 which is significant. Optimal level < 1.9. This is complex condition associated with genetics, ectopic fat and lifestyle factors. Insulin resistance may result in weight gain, abnormal cravings (particularly for carbs) and fatigue. This may result in additional weight gain and lead to pre-diabetes and diabetes if untreated.   Lab Results  Component Value Date   HGBA1C 5.0 06/18/2022   Lab Results  Component Value Date   INSULIN 23.4 06/18/2022   Lab Results  Component Value Date   GLUCOSE 93 06/18/2022   GLUCOSE 102 (H) 06/20/2011    I recommend ongoing weight loss therapy (10% of TBW), increasing physical activity, reducing processed, simple sugars and saturated fats in diet.  In addition we will increase metformin to 500 mg twice a day.  He is tolerating medication well without any adverse effects

## 2022-09-01 DIAGNOSIS — F250 Schizoaffective disorder, bipolar type: Secondary | ICD-10-CM | POA: Diagnosis not present

## 2022-09-11 ENCOUNTER — Telehealth (HOSPITAL_BASED_OUTPATIENT_CLINIC_OR_DEPARTMENT_OTHER): Payer: Self-pay | Admitting: Family Medicine

## 2022-09-11 NOTE — Telephone Encounter (Signed)
Pts Mother Seth Fields is stopping by to advise that Seth Fields  needs a written prescription for   Typhoid rather it be Oral alive attenuated OR the inactive IM  [facility] on [address] will complete this with a prescription, he will need this per his trip advisor

## 2022-09-15 DIAGNOSIS — F250 Schizoaffective disorder, bipolar type: Secondary | ICD-10-CM | POA: Diagnosis not present

## 2022-09-18 ENCOUNTER — Ambulatory Visit (INDEPENDENT_AMBULATORY_CARE_PROVIDER_SITE_OTHER): Payer: BC Managed Care – PPO | Admitting: Internal Medicine

## 2022-09-18 ENCOUNTER — Encounter (INDEPENDENT_AMBULATORY_CARE_PROVIDER_SITE_OTHER): Payer: Self-pay | Admitting: Internal Medicine

## 2022-09-18 VITALS — BP 120/74 | HR 76 | Temp 97.7°F | Ht 71.0 in | Wt 214.0 lb

## 2022-09-18 DIAGNOSIS — F250 Schizoaffective disorder, bipolar type: Secondary | ICD-10-CM | POA: Diagnosis not present

## 2022-09-18 DIAGNOSIS — Z6829 Body mass index (BMI) 29.0-29.9, adult: Secondary | ICD-10-CM | POA: Diagnosis not present

## 2022-09-18 DIAGNOSIS — E669 Obesity, unspecified: Secondary | ICD-10-CM | POA: Diagnosis not present

## 2022-09-18 DIAGNOSIS — E88819 Insulin resistance, unspecified: Secondary | ICD-10-CM

## 2022-09-18 MED ORDER — METFORMIN HCL ER 500 MG PO TB24: 500.0000 mg | ORAL_TABLET | Freq: Two times a day (BID) | ORAL | 0 refills | Status: DC

## 2022-09-18 NOTE — Progress Notes (Addendum)
Office: [phone]  /  Fax: [phone]  WEIGHT SUMMARY AND BIOMETRICS  Vitals Temp: 97.7 F (36.5 C) BP: 120/74 Pulse Rate: 76 SpO2: 97 %   Anthropometric Measurements Height: '5\' 11"'$  (1.803 m) Weight: 214 lb (97.1 kg) BMI (Calculated): 29.86 Weight at Last Visit: 213 lb Weight Lost Since Last Visit: 1 lb Starting Weight: 210 lb Total Weight Loss (lbs): 0 lb (0 kg) Peak Weight: 227 lb   Body Composition  Body Fat %: 26 % Fat Mass (lbs): 55.4 lbs Muscle Mass (lbs): 149.8 lbs Total Body Water (lbs): 103 lbs Visceral Fat Rating : 10    HPI  Chief Complaint: OBESITY  Seth Fields is here to discuss his progress with his obesity treatment plan. Seth Fields is getting 1200 calories and 90 grams of protein daily and states Seth Fields is following his eating plan approximately 85 % of the time. Seth Fields states Seth Fields is exercising 30 minutes 5-6 times per week.  Interval History:  Since last office visit Seth Fields has lost 1 pound. Seth Fields reports good adherence to reduced calorie nutritional plan. Seth Fields has been working on not skipping meals, increasing protein at every meal, drinking more water, avoiding and or reducing liquid calories, and has not been tracking calories as discussed '[x]'$ Denies '[]'$ Reports problems with appetite and hunger signals.  '[x]'$ Denies '[]'$ Reports problems with satiety and satiation.  '[x]'$ Denies '[]'$ Reports problems with eating patterns and portion control.  '[x]'$ Denies '[]'$ Reports abnormal cravings Stress levels are reported as medium and due to schooling .  Barriers identified inability do do meal prep and planning.   Pharmacotherapy for weight loss: Seth Fields is currently taking Metformin (off label use for incretin effect and / or insulin resistance and / or diabetes prevention) .  Seth Fields denies any adverse effects.  Weight promoting medications identified: Psychotropic medications.  ASSESSMENT AND PLAN  TREATMENT PLAN FOR OBESITY:  Recommended Dietary Goals  Seth Fields is currently in the action stage  of change. As such, his goal is to continue weight management plan. Seth Fields has agreed to: keeping a food journal and adhering to recommended goals of 1200 cal calories and 90 g of protein.  Behavioral Intervention  We discussed the following Behavioral Modification Strategies today: increasing lean protein intake, increasing vegetables, increasing water intake, work on meal planning and easy cooking plans, work on tracking and journaling calories using tracking App, and reading food labels .  Additional resources provided today: Handout on exercise goal setting  Recommended Physical Activity Goals  Seth Fields has been advised to work up to 150 minutes of moderate intensity aerobic activity a week and strengthening exercises 2-3 times per week for cardiovascular health, weight loss maintenance and preservation of muscle mass.   Seth Fields has agreed to :  Think about ways to increase physical activity  Pharmacotherapy We discussed various medication options to help Seth Fields with his weight loss efforts and we both agreed to : continue with nutritional and behavioral strategies and continue current anti-obesity medication regimen  ASSOCIATED CONDITIONS ADDRESSED TODAY  Obesity with current BMI 29.1  Insulin resistance Assessment & Plan: His HOMA-IR is 5.28 which is significant. Optimal level < 1.9. This is complex condition associated with genetics, ectopic fat and lifestyle factors. Insulin resistance may result in weight gain, abnormal cravings (particularly for carbs) and fatigue. This may result in additional weight gain and lead to pre-diabetes and diabetes if untreated.   Lab Results  Component Value Date   HGBA1C 5.0 06/18/2022   Lab Results  Component Value Date   INSULIN 23.4 06/18/2022  Lab Results  Component Value Date   GLUCOSE 93 06/18/2022   GLUCOSE 102 (H) 06/20/2011    Seth Fields is currently on metformin twice a day tolerating medication well without any adverse effects.  Seth Fields will  continue to work on reducing simple and added sugars in his diet and nutritional strategies for weight loss.    Orders: -     metFORMIN HCl ER; Take 1 tablet (500 mg total) by mouth 2 (two) times daily with a meal.  Dispense: 60 tablet; Refill: 0  Schizoaffective disorder, bipolar type (Seth Fields) Assessment & Plan: Stable.  On Cymbalta, clonazepam, Abilify, Adderall, lurasidone some may contribute to weight gain and insulin resistance.  Seth Fields is now on metformin to offset some of the weight gain.  Next would be to consider topiramate if no contraindications exist from a psychological standpoint.      PHYSICAL EXAM:  Blood pressure 120/74, pulse 76, temperature 97.7 F (36.5 C), height '5\' 11"'$  (1.803 m), weight 214 lb (97.1 kg), SpO2 97 %. Body mass index is 29.85 kg/m.  General: Seth Fields is overweight, cooperative, alert, well developed, and in no acute distress. PSYCH: Has normal mood, affect and thought process.   HEENT: EOMI, sclerae are anicteric. Lungs: Normal breathing effort, no conversational dyspnea. Extremities: No edema.  Neurologic: No gross sensory or motor deficits. No tremors or fasciculations noted.    DIAGNOSTIC DATA REVIEWED:  BMET    Component Value Date/Time   NA 139 06/18/2022 0919   K 4.8 06/18/2022 0919   CL 101 06/18/2022 0919   CO2 23 06/18/2022 0919   GLUCOSE 93 06/18/2022 0919   GLUCOSE 98 01/27/2015 1715   BUN 16 06/18/2022 0919   CREATININE 1.05 06/18/2022 0919   CREATININE 1.28 08/08/2012 1555   CALCIUM 10.2 06/18/2022 0919   GFRNONAA >60 01/27/2015 1715   GFRAA >60 01/27/2015 1715   Lab Results  Component Value Date   HGBA1C 5.0 06/18/2022   Lab Results  Component Value Date   INSULIN 23.4 06/18/2022   Lab Results  Component Value Date   TSH 2.000 06/18/2022   CBC    Component Value Date/Time   WBC 10.2 01/27/2015 1715   RBC 5.58 01/27/2015 1715   HGB 16.6 01/27/2015 1715   HCT 48.0 01/27/2015 1715   PLT 303 01/27/2015 1715   MCV 86.0  01/27/2015 1715   MCV 79.1 (A) 08/08/2012 1611   MCH 29.7 01/27/2015 1715   MCHC 34.6 01/27/2015 1715   RDW 13.8 01/27/2015 1715   Iron Studies No results found for: "IRON", "TIBC", "FERRITIN", "IRONPCTSAT" Lipid Panel     Component Value Date/Time   CHOL 198 06/18/2022 0919   TRIG 223 (H) 06/18/2022 0919   HDL 41 06/18/2022 0919   LDLCALC 118 (H) 06/18/2022 0919   Hepatic Function Panel     Component Value Date/Time   PROT 7.3 06/18/2022 0919   ALBUMIN 4.8 06/18/2022 0919   AST 28 06/18/2022 0919   ALT 40 06/18/2022 0919   ALKPHOS 86 06/18/2022 0919   BILITOT 0.5 06/18/2022 0919   BILIDIR <0.1 07/24/2011 2332   IBILI NOT CALCULATED 07/24/2011 2332      Component Value Date/Time   TSH 2.000 06/18/2022 0919   Nutritional Lab Results  Component Value Date   VD25OH 38.6 06/18/2022     Return in about 3 weeks (around 10/09/2022) for For Weight Mangement with Dr. Gerarda Fraction.Marland Kitchen Seth Fields was informed of the importance of frequent follow up visits to maximize his success with intensive  lifestyle modifications for his multiple health conditions.   ATTESTASTION STATEMENTS:  Reviewed by clinician on day of visit: allergies, medications, problem list, medical history, surgical history, family history, social history, and previous encounter notes.     Thomes Dinning, MD

## 2022-09-18 NOTE — Assessment & Plan Note (Signed)
His HOMA-IR is 5.28 which is significant. Optimal level < 1.9. This is complex condition associated with genetics, ectopic fat and lifestyle factors. Insulin resistance may result in weight gain, abnormal cravings (particularly for carbs) and fatigue. This may result in additional weight gain and lead to pre-diabetes and diabetes if untreated.   Lab Results  Component Value Date   HGBA1C 5.0 06/18/2022   Lab Results  Component Value Date   INSULIN 23.4 06/18/2022   Lab Results  Component Value Date   GLUCOSE 93 06/18/2022   GLUCOSE 102 (H) 06/20/2011    He is currently on metformin twice a day tolerating medication well without any adverse effects.  He will continue to work on reducing simple and added sugars in his diet and nutritional strategies for weight loss.

## 2022-09-18 NOTE — Assessment & Plan Note (Signed)
Stable.  On Cymbalta, clonazepam, Abilify, Adderall, lurasidone some may contribute to weight gain and insulin resistance.  He is now on metformin to offset some of the weight gain.  Next would be to consider topiramate if no contraindications exist from a psychological standpoint.

## 2022-09-29 DIAGNOSIS — F250 Schizoaffective disorder, bipolar type: Secondary | ICD-10-CM | POA: Diagnosis not present

## 2022-09-29 DIAGNOSIS — C44329 Squamous cell carcinoma of skin of other parts of face: Secondary | ICD-10-CM | POA: Diagnosis not present

## 2022-09-29 DIAGNOSIS — L820 Inflamed seborrheic keratosis: Secondary | ICD-10-CM | POA: Diagnosis not present

## 2022-09-29 DIAGNOSIS — L570 Actinic keratosis: Secondary | ICD-10-CM | POA: Diagnosis not present

## 2022-10-01 DIAGNOSIS — F250 Schizoaffective disorder, bipolar type: Secondary | ICD-10-CM | POA: Diagnosis not present

## 2022-10-07 DIAGNOSIS — Z6829 Body mass index (BMI) 29.0-29.9, adult: Secondary | ICD-10-CM | POA: Diagnosis not present

## 2022-10-07 DIAGNOSIS — Z7184 Encounter for health counseling related to travel: Secondary | ICD-10-CM | POA: Diagnosis not present

## 2022-10-07 DIAGNOSIS — Z23 Encounter for immunization: Secondary | ICD-10-CM | POA: Diagnosis not present

## 2022-10-13 DIAGNOSIS — F250 Schizoaffective disorder, bipolar type: Secondary | ICD-10-CM | POA: Diagnosis not present

## 2022-10-15 DIAGNOSIS — F250 Schizoaffective disorder, bipolar type: Secondary | ICD-10-CM | POA: Diagnosis not present

## 2022-10-16 ENCOUNTER — Ambulatory Visit (INDEPENDENT_AMBULATORY_CARE_PROVIDER_SITE_OTHER): Payer: BC Managed Care – PPO | Admitting: Internal Medicine

## 2022-10-16 ENCOUNTER — Encounter (INDEPENDENT_AMBULATORY_CARE_PROVIDER_SITE_OTHER): Payer: Self-pay | Admitting: Internal Medicine

## 2022-10-16 VITALS — BP 102/66 | HR 80 | Temp 97.7°F | Ht 71.0 in | Wt 213.0 lb

## 2022-10-16 DIAGNOSIS — E669 Obesity, unspecified: Secondary | ICD-10-CM | POA: Diagnosis not present

## 2022-10-16 DIAGNOSIS — E88819 Insulin resistance, unspecified: Secondary | ICD-10-CM

## 2022-10-16 DIAGNOSIS — Z6829 Body mass index (BMI) 29.0-29.9, adult: Secondary | ICD-10-CM | POA: Diagnosis not present

## 2022-10-16 DIAGNOSIS — Z79899 Other long term (current) drug therapy: Secondary | ICD-10-CM | POA: Diagnosis not present

## 2022-10-16 MED ORDER — METFORMIN HCL ER 500 MG PO TB24: 500.0000 mg | ORAL_TABLET | Freq: Two times a day (BID) | ORAL | 0 refills | Status: DC

## 2022-10-16 NOTE — Assessment & Plan Note (Signed)
Overall Seth Fields continues to make gradual progress.  His body composition is improving.  His BMI is now 29.  I explained to him and his father that it is going to be harder for him to lose weight considering starting BMI, low metabolic rate in the presence of weight promoting drugs.  He is also not a good candidate for incretin therapy.  He will work on tracking and journaling calories using an app.  He will continue on metformin for incretin effect

## 2022-10-16 NOTE — Assessment & Plan Note (Signed)
Patient on several medications that may contribute to weight gain and affecting weight loss efforts.  Continue metformin to offset these effects. 

## 2022-10-16 NOTE — Progress Notes (Signed)
Office: [phone]  /  Fax: [phone]  WEIGHT SUMMARY AND BIOMETRICS  Vitals Temp: 97.7 F (36.5 C) BP: 102/66 Pulse Rate: 80 SpO2: 94 %   Anthropometric Measurements Height: 5\' 11"  (1.803 m) Weight: 213 lb (96.6 kg) BMI (Calculated): 29.72 Weight at Last Visit: 214 lb Weight Lost Since Last Visit: 1 lb Starting Weight: 210 lb Total Weight Loss (lbs): 0 lb (0 kg) Peak Weight: 227 lb   Body Composition  Body Fat %: 25.8 % Fat Mass (lbs): 55.2 lbs Muscle Mass (lbs): 150.6 lbs Total Body Water (lbs): 105.4 lbs Visceral Fat Rating : 10   Weight Lost Since Last Visit: 1 lb    HPI  Chief Complaint: OBESITY  Seth Fields is here to discuss his progress with his obesity treatment plan. He is on the the Category 2 Plan and states he is following his eating plan approximately 90 % of the time. He states he is exercising 5 minutes 60 times per week.  Interval History:  Seth Fields has lost 1 pound.  He has been exercising but acknowledges at times snacking mindlessly especially when working on his computer and watching TV.  He has not been tracking or journaling calories as instructed using app.  He reports being busy with schoolwork.  He is currently taking metformin twice a day and feels that the medication has been helpful with cravings as well as satiety.  He is on several psychotropic medications that are weight promoting.  His BIA shows a reduction in body fat percentage and increase in muscle mass so overall favorable changes and body composition but gradual.   Barriers identified: having difficulty preparing healthy meals, having difficulty with meal prep and planning, predilection for convenience or prepackaged foods, medical comorbidities, and presence of weight promoting drugs .   Pharmacotherapy for weight loss: He is currently taking Metformin (off label use for incretin effect and / or insulin resistance and / or diabetes prevention) .    ASSESSMENT AND  PLAN  TREATMENT PLAN FOR OBESITY:  Recommended Dietary Goals  Seth Fields is currently in the action stage of change. As such, his goal is to continue weight management plan. He has agreed to: Continue category 2 plan but to start keep a food journal with a target of  1200 cal calories and 90 grams of protein  and continue current plan.  We have to troubleshoot calories on as well as protein intake.  I feel that tracking and journaling is necessary at present time.  Behavioral Intervention  We discussed the following Behavioral Modification Strategies today: decreasing simple carbohydrates , work on tracking and journaling calories using tracking application, and reading food labels .  Additional resources provided today: None  Recommended Physical Activity Goals  Seth Fields has been advised to work up to 150 minutes of moderate intensity aerobic activity a week and strengthening exercises 2-3 times per week for cardiovascular health, weight loss maintenance and preservation of muscle mass.   He has agreed to :  Continue current level of physical activity   Pharmacotherapy We discussed various medication options to help Seth Fields with his weight loss efforts and we both agreed to : continue current anti-obesity medication regimen.  He is not a good candidate for incretin therapy or other antiobesity medications because of drug interactions.  ASSOCIATED CONDITIONS ADDRESSED TODAY  Obesity with current BMI 29.1 Assessment & Plan: Overall Seth Fields continues to make gradual progress.  His body composition is improving.  His BMI is now 29.  I explained to  him and his father that it is going to be harder for him to lose weight considering starting BMI, low metabolic rate in the presence of weight promoting drugs.  He is also not a good candidate for incretin therapy.  He will work on tracking and journaling calories using an app.  He will continue on metformin for incretin effect   Insulin  resistance Assessment & Plan: His HOMA-IR is 5.28 which is significant. Optimal level < 1.9. This is complex condition associated with genetics, ectopic fat and lifestyle factors. Insulin resistance may result in weight gain, abnormal cravings (particularly for carbs) and fatigue. This may result in additional weight gain and lead to pre-diabetes and diabetes if untreated.   Lab Results  Component Value Date   HGBA1C 5.0 06/18/2022   Lab Results  Component Value Date   INSULIN 23.4 06/18/2022   Lab Results  Component Value Date   GLUCOSE 93 06/18/2022   GLUCOSE 102 (H) 06/20/2011    He is currently on metformin twice a day tolerating medication well without any adverse effects.  He will continue to work on reducing simple and added sugars in his diet and nutritional strategies for weight loss.    Orders: -     metFORMIN HCl ER; Take 1 tablet (500 mg total) by mouth 2 (two) times daily with a meal.  Dispense: 60 tablet; Refill: 0  Polypharmacy Assessment & Plan: Patient on several medications that may contribute to weight gain and affecting weight loss efforts.  Continue metformin to offset these effects.      PHYSICAL EXAM:  Blood pressure 102/66, pulse 80, temperature 97.7 F (36.5 C), height 5\' 11"  (1.803 m), weight 213 lb (96.6 kg), SpO2 94 %. Body mass index is 29.71 kg/m.  General: He is overweight, cooperative, alert, well developed, and in no acute distress. PSYCH: Has normal mood, affect and thought process.   HEENT: EOMI, sclerae are anicteric. Lungs: Normal breathing effort, no conversational dyspnea. Extremities: No edema.  Neurologic: No gross sensory or motor deficits. No tremors or fasciculations noted.    DIAGNOSTIC DATA REVIEWED:  BMET    Component Value Date/Time   NA 139 06/18/2022 0919   K 4.8 06/18/2022 0919   CL 101 06/18/2022 0919   CO2 23 06/18/2022 0919   GLUCOSE 93 06/18/2022 0919   GLUCOSE 98 01/27/2015 1715   BUN 16 06/18/2022 0919    CREATININE 1.05 06/18/2022 0919   CREATININE 1.28 08/08/2012 1555   CALCIUM 10.2 06/18/2022 0919   GFRNONAA >60 01/27/2015 1715   GFRAA >60 01/27/2015 1715   Lab Results  Component Value Date   HGBA1C 5.0 06/18/2022   Lab Results  Component Value Date   INSULIN 23.4 06/18/2022   Lab Results  Component Value Date   TSH 2.000 06/18/2022   CBC    Component Value Date/Time   WBC 10.2 01/27/2015 1715   RBC 5.58 01/27/2015 1715   HGB 16.6 01/27/2015 1715   HCT 48.0 01/27/2015 1715   PLT 303 01/27/2015 1715   MCV 86.0 01/27/2015 1715   MCV 79.1 (A) 08/08/2012 1611   MCH 29.7 01/27/2015 1715   MCHC 34.6 01/27/2015 1715   RDW 13.8 01/27/2015 1715   Iron Studies No results found for: "IRON", "TIBC", "FERRITIN", "IRONPCTSAT" Lipid Panel     Component Value Date/Time   CHOL 198 06/18/2022 0919   TRIG 223 (H) 06/18/2022 0919   HDL 41 06/18/2022 0919   LDLCALC 118 (H) 06/18/2022 0919   Hepatic  Function Panel     Component Value Date/Time   PROT 7.3 06/18/2022 0919   ALBUMIN 4.8 06/18/2022 0919   AST 28 06/18/2022 0919   ALT 40 06/18/2022 0919   ALKPHOS 86 06/18/2022 0919   BILITOT 0.5 06/18/2022 0919   BILIDIR <0.1 07/24/2011 2332   IBILI NOT CALCULATED 07/24/2011 2332      Component Value Date/Time   TSH 2.000 06/18/2022 0919   Nutritional Lab Results  Component Value Date   VD25OH 38.6 06/18/2022     Return in about 4 weeks (around 11/13/2022) for For Weight Mangement with Dr. Rikki Spearing.Marland Kitchen He was informed of the importance of frequent follow up visits to maximize his success with intensive lifestyle modifications for his multiple health conditions.   ATTESTASTION STATEMENTS:  Reviewed by clinician on day of visit: allergies, medications, problem list, medical history, surgical history, family history, social history, and previous encounter notes.     Worthy Rancher, MD

## 2022-10-16 NOTE — Assessment & Plan Note (Signed)
His HOMA-IR is 5.28 which is significant. Optimal level < 1.9. This is complex condition associated with genetics, ectopic fat and lifestyle factors. Insulin resistance may result in weight gain, abnormal cravings (particularly for carbs) and fatigue. This may result in additional weight gain and lead to pre-diabetes and diabetes if untreated.   Lab Results  Component Value Date   HGBA1C 5.0 06/18/2022   Lab Results  Component Value Date   INSULIN 23.4 06/18/2022   Lab Results  Component Value Date   GLUCOSE 93 06/18/2022   GLUCOSE 102 (H) 06/20/2011    He is currently on metformin twice a day tolerating medication well without any adverse effects.  He will continue to work on reducing simple and added sugars in his diet and nutritional strategies for weight loss.   

## 2022-10-23 ENCOUNTER — Other Ambulatory Visit (HOSPITAL_COMMUNITY): Payer: Self-pay

## 2022-10-23 MED ORDER — ARIPIPRAZOLE ER 400 MG IM PRSY
400.0000 mg | PREFILLED_SYRINGE | INTRAMUSCULAR | 2 refills | Status: DC
  Filled 2022-10-23 – 2022-11-07 (×3): qty 1, 30d supply, fill #0

## 2022-10-27 DIAGNOSIS — F250 Schizoaffective disorder, bipolar type: Secondary | ICD-10-CM | POA: Diagnosis not present

## 2022-11-07 ENCOUNTER — Other Ambulatory Visit: Payer: Self-pay

## 2022-11-07 ENCOUNTER — Other Ambulatory Visit (HOSPITAL_COMMUNITY): Payer: Self-pay

## 2022-11-10 ENCOUNTER — Other Ambulatory Visit (HOSPITAL_COMMUNITY): Payer: Self-pay

## 2022-11-10 ENCOUNTER — Other Ambulatory Visit: Payer: Self-pay

## 2022-11-10 DIAGNOSIS — F250 Schizoaffective disorder, bipolar type: Secondary | ICD-10-CM | POA: Diagnosis not present

## 2022-11-13 ENCOUNTER — Encounter (INDEPENDENT_AMBULATORY_CARE_PROVIDER_SITE_OTHER): Payer: Self-pay | Admitting: Internal Medicine

## 2022-11-13 ENCOUNTER — Ambulatory Visit (INDEPENDENT_AMBULATORY_CARE_PROVIDER_SITE_OTHER): Payer: BC Managed Care – PPO | Admitting: Internal Medicine

## 2022-11-13 VITALS — BP 124/73 | HR 87 | Temp 97.8°F | Ht 71.0 in | Wt 209.0 lb

## 2022-11-13 DIAGNOSIS — E88819 Insulin resistance, unspecified: Secondary | ICD-10-CM

## 2022-11-13 DIAGNOSIS — Z6829 Body mass index (BMI) 29.0-29.9, adult: Secondary | ICD-10-CM

## 2022-11-13 DIAGNOSIS — E669 Obesity, unspecified: Secondary | ICD-10-CM | POA: Diagnosis not present

## 2022-11-13 DIAGNOSIS — K50919 Crohn's disease, unspecified, with unspecified complications: Secondary | ICD-10-CM | POA: Diagnosis not present

## 2022-11-13 MED ORDER — METFORMIN HCL ER 500 MG PO TB24: 500.0000 mg | ORAL_TABLET | Freq: Two times a day (BID) | ORAL | 0 refills | Status: DC

## 2022-11-13 NOTE — Assessment & Plan Note (Signed)
His HOMA-IR is 5.28 which is significant. Optimal level < 1.9. This is complex condition associated with genetics, ectopic fat and lifestyle factors. Insulin resistance may result in weight gain, abnormal cravings (particularly for carbs) and fatigue. This may result in additional weight gain and lead to pre-diabetes and diabetes if untreated.   Lab Results  Component Value Date   HGBA1C 5.0 06/18/2022   Lab Results  Component Value Date   INSULIN 23.4 06/18/2022   Lab Results  Component Value Date   GLUCOSE 93 06/18/2022   GLUCOSE 102 (H) 06/20/2011    He is currently on metformin twice a day tolerating medication well without any adverse effects.  He will continue to work on reducing simple and added sugars in his diet and nutritional strategies for weight loss.   

## 2022-11-13 NOTE — Assessment & Plan Note (Signed)
So far Seth Fields has lost 8% of body weight.  We helped him set up tracking app during this visit.  He will be tracking of 1200 cal a day and 90 g of protein.  We talked about healthy snacking.  He will continue on metformin for insulin resistance and incretin effect.  He will continue to exercise we discussed the benefits of increasing strengthening activities to preserve muscle mass.  His body fat percentage continues to decrease with overall preservation of muscle mass.

## 2022-11-13 NOTE — Assessment & Plan Note (Signed)
Patient has Crohn's disease which appears to be stable.  He is on immunomodulating drugs.  He is tolerating metformin twice a day without any adverse effects.  Metformin has been shown to improve gut microbiome which may be beneficial for inflammatory bowel disease.  He will continue medication.

## 2022-11-13 NOTE — Progress Notes (Signed)
Office: [phone]  /  Fax: [phone]  WEIGHT SUMMARY AND BIOMETRICS  Vitals Temp: 97.8 F (36.6 C) BP: 124/73 Pulse Rate: 87 SpO2: 97 %   Anthropometric Measurements Height: 5\' 11"  (1.803 m) Weight: 209 lb (94.8 kg) BMI (Calculated): 29.16 Weight at Last Visit: 213 lb Weight Lost Since Last Visit: 4 lb Starting Weight: 210 lb Total Weight Loss (lbs): 1 lb (0.454 kg) Peak Weight: 227 lb   Body Composition  Body Fat %: 25.3 % Fat Mass (lbs): 52.8 lbs Muscle Mass (lbs): 148.4 lbs Total Body Water (lbs): 102.6 lbs Visceral Fat Rating : 9    No data recorded Today's Visit #: 8  Starting Date: 06/17/22   HPI  Chief Complaint: OBESITY  Seth Fields is here to discuss his progress with his obesity treatment plan. He is on the the Category 2 Plan and states he is following his eating plan approximately 90 % of the time. He states he is exercising 45 minutes 6 times per week.  Interval History:  Since last office visit he has 4 lbs. He reports good adherence to reduced calorie nutritional plan. He has been working on not skipping meals, increasing protein intake at every meal, drinking more water, and begun to exercise Denies problems with appetite and hunger signals.  Denies problems with satiety and satiation.  Denies problems with eating patterns and portion control.  Denies abnormal cravings. Denies feeling deprived or restricted.   Barriers identified: none.   Pharmacotherapy for weight loss: He is currently taking Metformin (off label use for incretin effect and / or insulin resistance and / or diabetes prevention) .    ASSESSMENT AND PLAN  TREATMENT PLAN FOR OBESITY:  Recommended Dietary Goals  Seth Fields is currently in the action stage of change. As such, his goal is to continue weight management plan. He has agreed to: continue current plan  Behavioral Intervention  We discussed the following Behavioral Modification Strategies today: increasing  lean protein intake, decreasing simple carbohydrates , increasing vegetables, increasing lower glycemic fruits, increasing water intake, work on tracking and journaling calories using tracking application, reading food labels , and planning for success.  Additional resources provided today: None  Recommended Physical Activity Goals  Seth Fields has been advised to work up to 150 minutes of moderate intensity aerobic activity a week and strengthening exercises 2-3 times per week for cardiovascular health, weight loss maintenance and preservation of muscle mass.   He has agreed to :  Start strengthening exercises with a goal of 2-3 sessions a week   Pharmacotherapy We discussed various medication options to help Seth Fields with his weight loss efforts and we both agreed to : continue with nutritional and behavioral strategies  ASSOCIATED CONDITIONS ADDRESSED TODAY  Obesity with current BMI 29.1 Assessment & Plan: So far Seth Fields has lost 8% of body weight.  We helped him set up tracking app during this visit.  He will be tracking of 1200 cal a day and 90 g of protein.  We talked about healthy snacking.  He will continue on metformin for insulin resistance and incretin effect.  He will continue to exercise we discussed the benefits of increasing strengthening activities to preserve muscle mass.  His body fat percentage continues to decrease with overall preservation of muscle mass.   Insulin resistance Assessment & Plan: His HOMA-IR is 5.28 which is significant. Optimal level < 1.9. This is complex condition associated with genetics, ectopic fat and lifestyle factors. Insulin resistance may result in weight gain, abnormal cravings (  particularly for carbs) and fatigue. This may result in additional weight gain and lead to pre-diabetes and diabetes if untreated.   Lab Results  Component Value Date   HGBA1C 5.0 06/18/2022   Lab Results  Component Value Date   INSULIN 23.4 06/18/2022   Lab Results   Component Value Date   GLUCOSE 93 06/18/2022   GLUCOSE 102 (H) 06/20/2011    He is currently on metformin twice a day tolerating medication well without any adverse effects.  He will continue to work on reducing simple and added sugars in his diet and nutritional strategies for weight loss.    Orders: -     metFORMIN HCl ER; Take 1 tablet (500 mg total) by mouth 2 (two) times daily with a meal.  Dispense: 60 tablet; Refill: 0  Crohn's disease with complication, unspecified gastrointestinal tract location [facility]) Assessment & Plan: Patient has Crohn's disease which appears to be stable.  He is on immunomodulating drugs.  He is tolerating metformin twice a day without any adverse effects.  Metformin has been shown to improve gut microbiome which may be beneficial for inflammatory bowel disease.  He will continue medication.     PHYSICAL EXAM:  Blood pressure 124/73, pulse 87, temperature 97.8 F (36.6 C), height 5\' 11"  (1.803 m), weight 209 lb (94.8 kg), SpO2 97 %. Body mass index is 29.15 kg/m.  General: He is overweight, cooperative, alert, well developed, and in no acute distress. PSYCH: Has normal mood, affect and thought process.   HEENT: EOMI, sclerae are anicteric. Lungs: Normal breathing effort, no conversational dyspnea. Extremities: No edema.  Neurologic: No gross sensory or motor deficits. No tremors or fasciculations noted.    DIAGNOSTIC DATA REVIEWED:  BMET    Component Value Date/Time   NA 139 06/18/2022 0919   K 4.8 06/18/2022 0919   CL 101 06/18/2022 0919   CO2 23 06/18/2022 0919   GLUCOSE 93 06/18/2022 0919   GLUCOSE 98 01/27/2015 1715   BUN 16 06/18/2022 0919   CREATININE 1.05 06/18/2022 0919   CREATININE 1.28 08/08/2012 1555   CALCIUM 10.2 06/18/2022 0919   GFRNONAA >60 01/27/2015 1715   GFRAA >60 01/27/2015 1715   Lab Results  Component Value Date   HGBA1C 5.0 06/18/2022   Lab Results  Component Value Date   INSULIN 23.4 06/18/2022   Lab  Results  Component Value Date   TSH 2.000 06/18/2022   CBC    Component Value Date/Time   WBC 10.2 01/27/2015 1715   RBC 5.58 01/27/2015 1715   HGB 16.6 01/27/2015 1715   HCT 48.0 01/27/2015 1715   PLT 303 01/27/2015 1715   MCV 86.0 01/27/2015 1715   MCV 79.1 (A) 08/08/2012 1611   MCH 29.7 01/27/2015 1715   MCHC 34.6 01/27/2015 1715   RDW 13.8 01/27/2015 1715   Iron Studies No results found for: "IRON", "TIBC", "FERRITIN", "IRONPCTSAT" Lipid Panel     Component Value Date/Time   CHOL 198 06/18/2022 0919   TRIG 223 (H) 06/18/2022 0919   HDL 41 06/18/2022 0919   LDLCALC 118 (H) 06/18/2022 0919   Hepatic Function Panel     Component Value Date/Time   PROT 7.3 06/18/2022 0919   ALBUMIN 4.8 06/18/2022 0919   AST 28 06/18/2022 0919   ALT 40 06/18/2022 0919   ALKPHOS 86 06/18/2022 0919   BILITOT 0.5 06/18/2022 0919   BILIDIR <0.1 07/24/2011 2332   IBILI NOT CALCULATED 07/24/2011 2332      Component Value Date/Time  TSH 2.000 06/18/2022 0919   Nutritional Lab Results  Component Value Date   VD25OH 38.6 06/18/2022     Return in about 4 weeks (around 12/11/2022) for For Weight Mangement with Dr. Rikki Spearing , schedule next 2 appointments.. He was informed of the importance of frequent follow up visits to maximize his success with intensive lifestyle modifications for his multiple health conditions.   ATTESTASTION STATEMENTS:  Reviewed by clinician on day of visit: allergies, medications, problem list, medical history, surgical history, family history, social history, and previous encounter notes.     Worthy Rancher, MD

## 2022-11-26 ENCOUNTER — Other Ambulatory Visit (HOSPITAL_COMMUNITY): Payer: Self-pay

## 2022-11-26 DIAGNOSIS — F902 Attention-deficit hyperactivity disorder, combined type: Secondary | ICD-10-CM | POA: Diagnosis not present

## 2022-11-26 DIAGNOSIS — F3181 Bipolar II disorder: Secondary | ICD-10-CM | POA: Diagnosis not present

## 2022-11-26 DIAGNOSIS — F251 Schizoaffective disorder, depressive type: Secondary | ICD-10-CM | POA: Diagnosis not present

## 2022-11-26 DIAGNOSIS — F250 Schizoaffective disorder, bipolar type: Secondary | ICD-10-CM | POA: Diagnosis not present

## 2022-11-26 DIAGNOSIS — Z5181 Encounter for therapeutic drug level monitoring: Secondary | ICD-10-CM | POA: Diagnosis not present

## 2022-11-26 MED ORDER — ABILIFY MAINTENA 400 MG IM PRSY: 400.0000 mg | PREFILLED_SYRINGE | INTRAMUSCULAR | 2 refills | Status: DC

## 2022-11-26 MED ORDER — ABILIFY MAINTENA 400 MG IM PRSY
400.0000 mg | PREFILLED_SYRINGE | INTRAMUSCULAR | 2 refills | Status: DC
  Filled 2022-12-10: qty 1, 30d supply, fill #0

## 2022-11-26 MED ORDER — ABILIFY MAINTENA 400 MG IM PRSY
400.0000 mg | PREFILLED_SYRINGE | INTRAMUSCULAR | 2 refills | Status: DC
  Filled 2022-12-04: qty 1, 30d supply, fill #0
  Filled 2023-01-01: qty 1, 30d supply, fill #1
  Filled 2023-01-17 – 2023-06-16 (×3): qty 1, 30d supply, fill #2

## 2022-11-27 ENCOUNTER — Other Ambulatory Visit (HOSPITAL_COMMUNITY): Payer: Self-pay

## 2022-12-02 DIAGNOSIS — Z85828 Personal history of other malignant neoplasm of skin: Secondary | ICD-10-CM | POA: Diagnosis not present

## 2022-12-02 DIAGNOSIS — L821 Other seborrheic keratosis: Secondary | ICD-10-CM | POA: Diagnosis not present

## 2022-12-02 DIAGNOSIS — D2239 Melanocytic nevi of other parts of face: Secondary | ICD-10-CM | POA: Diagnosis not present

## 2022-12-02 DIAGNOSIS — L820 Inflamed seborrheic keratosis: Secondary | ICD-10-CM | POA: Diagnosis not present

## 2022-12-04 ENCOUNTER — Other Ambulatory Visit: Payer: Self-pay

## 2022-12-04 ENCOUNTER — Other Ambulatory Visit (HOSPITAL_COMMUNITY): Payer: Self-pay

## 2022-12-05 ENCOUNTER — Other Ambulatory Visit (HOSPITAL_COMMUNITY): Payer: Self-pay

## 2022-12-05 ENCOUNTER — Other Ambulatory Visit: Payer: Self-pay

## 2022-12-08 DIAGNOSIS — F250 Schizoaffective disorder, bipolar type: Secondary | ICD-10-CM | POA: Diagnosis not present

## 2022-12-10 ENCOUNTER — Other Ambulatory Visit: Payer: Self-pay

## 2022-12-10 ENCOUNTER — Other Ambulatory Visit (HOSPITAL_COMMUNITY): Payer: Self-pay

## 2022-12-11 ENCOUNTER — Other Ambulatory Visit (HOSPITAL_COMMUNITY): Payer: Self-pay

## 2022-12-11 ENCOUNTER — Other Ambulatory Visit: Payer: Self-pay

## 2022-12-11 DIAGNOSIS — F250 Schizoaffective disorder, bipolar type: Secondary | ICD-10-CM | POA: Diagnosis not present

## 2022-12-11 DIAGNOSIS — K50819 Crohn's disease of both small and large intestine with unspecified complications: Secondary | ICD-10-CM | POA: Diagnosis not present

## 2022-12-11 DIAGNOSIS — Z7184 Encounter for health counseling related to travel: Secondary | ICD-10-CM | POA: Diagnosis not present

## 2022-12-18 ENCOUNTER — Encounter (INDEPENDENT_AMBULATORY_CARE_PROVIDER_SITE_OTHER): Payer: Self-pay | Admitting: Internal Medicine

## 2022-12-18 ENCOUNTER — Ambulatory Visit (INDEPENDENT_AMBULATORY_CARE_PROVIDER_SITE_OTHER): Payer: BC Managed Care – PPO | Admitting: Internal Medicine

## 2022-12-18 VITALS — BP 119/76 | HR 77 | Temp 97.6°F | Ht 71.0 in | Wt 203.0 lb

## 2022-12-18 DIAGNOSIS — K50919 Crohn's disease, unspecified, with unspecified complications: Secondary | ICD-10-CM

## 2022-12-18 DIAGNOSIS — E669 Obesity, unspecified: Secondary | ICD-10-CM | POA: Diagnosis not present

## 2022-12-18 DIAGNOSIS — E88819 Insulin resistance, unspecified: Secondary | ICD-10-CM

## 2022-12-18 DIAGNOSIS — Z79899 Other long term (current) drug therapy: Secondary | ICD-10-CM

## 2022-12-18 DIAGNOSIS — Z6829 Body mass index (BMI) 29.0-29.9, adult: Secondary | ICD-10-CM

## 2022-12-18 MED ORDER — METFORMIN HCL ER 500 MG PO TB24: 500.0000 mg | ORAL_TABLET | Freq: Two times a day (BID) | ORAL | 0 refills | Status: DC

## 2022-12-18 NOTE — Assessment & Plan Note (Signed)
So far Seth Fields has lost 10 % of body weight.  Body fat percentage has gone down to 24 % he has been playing tennis and doing strengthening with a certified fitness trainer.  We discussed the importance of getting 25 to 40 g of protein with each meal to preserve muscle mass.  He will continue on metformin for insulin resistance and incretin effect.  His weight circumference is down by 1 inch.

## 2022-12-18 NOTE — Assessment & Plan Note (Signed)
Patient has Crohn's disease which appears to be stable.  He is on immunomodulating drugs which may cause weight gain.  He is tolerating metformin twice a day without any adverse effects.  Metformin has been shown to improve gut microbiome which may be beneficial for inflammatory bowel disease.  He will continue medication.

## 2022-12-18 NOTE — Assessment & Plan Note (Signed)
His HOMA-IR is 5.28 which is significant. Optimal level < 1.9. This is complex condition associated with genetics, ectopic fat and lifestyle factors. Insulin resistance may result in weight gain, abnormal cravings (particularly for carbs) and fatigue. This may result in additional weight gain and lead to pre-diabetes and diabetes if untreated.   Lab Results  Component Value Date   HGBA1C 5.0 06/18/2022   Lab Results  Component Value Date   INSULIN 23.4 06/18/2022   Lab Results  Component Value Date   GLUCOSE 93 06/18/2022   GLUCOSE 102 (H) 06/20/2011    He is currently on metformin twice a day tolerating medication well without any adverse effects.  He will continue to work on reducing simple and added sugars in his diet and nutritional strategies for weight loss.  We will check fasting blood glucose and insulin levels in the near future.

## 2022-12-18 NOTE — Assessment & Plan Note (Signed)
Patient on several medications that may contribute to weight gain and affecting weight loss efforts.  Continue metformin to offset these effects. 

## 2022-12-18 NOTE — Progress Notes (Signed)
Office: [phone]  /  Fax: [phone]  WEIGHT SUMMARY AND BIOMETRICS  Vitals Temp: 97.6 F (36.4 C) BP: 119/76 Pulse Rate: 77 SpO2: 97 %   Anthropometric Measurements Height: 5\' 11"  (1.803 m) Weight: 203 lb (92.1 kg) BMI (Calculated): 28.33 Weight at Last Visit: 209 lb Weight Lost Since Last Visit: 6 lb Starting Weight: 210 lb Total Weight Loss (lbs): 7 lb (3.175 kg) Peak Weight: 227 lb   Body Composition  Body Fat %: 24.1 % Fat Mass (lbs): 49.2 lbs Muscle Mass (lbs): 147 lbs Total Body Water (lbs): 100 lbs Visceral Fat Rating : 8    No data recorded Today's Visit #: 9  Starting Date: 06/17/22   HPI  Chief Complaint: OBESITY  Seth Fields is here to discuss his progress with his obesity treatment plan. He is on the keeping a food journal and adhering to recommended goals of 1200 calories and 90 protein and states he is following his eating plan approximately 85 % of the time. He states he is exercising 45 minutes 4 times per week.  Interval History:  Since last office visit he has lost 6 lbs. BIA shows a further reduction in body fat now down to 24% goal is 19. He has lost some muscle but no more than expected.  He reports good adherence to reduced calorie nutritional plan. He has been working on not skipping meals, increasing protein intake at every meal, eating more fruits, eating more vegetables, drinking more water, avoiding and or reducing liquid calories, making healthier choices, continues to exercise, and has stopped snacking, is playing tennis and doing strengthening with trainer. Denies problems with appetite and hunger signals.  Denies problems with satiety and satiation.  Denies problems with eating patterns and portion control.  Denies abnormal cravings. Denies feeling deprived or restricted.   Barriers identified:  Weight promoting psychotropic medication. Marland Kitchen   Pharmacotherapy for weight loss: He is currently taking Metformin (off label use for  incretin effect and / or insulin resistance and / or diabetes prevention) .    ASSESSMENT AND PLAN  TREATMENT PLAN FOR OBESITY:  Recommended Dietary Goals  Seth Fields is currently in the action stage of change. As such, his goal is to continue weight management plan. He has agreed to: continue current plan  Behavioral Intervention  We discussed the following Behavioral Modification Strategies today: increasing lean protein intake, decreasing simple carbohydrates , increasing vegetables, increasing lower glycemic fruits, increasing fiber rich foods, increasing water intake, continue to practice mindfulness when eating, and planning for success.  Additional resources provided today: None  Recommended Physical Activity Goals  Seth Fields has been advised to work up to 150 minutes of moderate intensity aerobic activity a week and strengthening exercises 2-3 times per week for cardiovascular health, weight loss maintenance and preservation of muscle mass.   He has agreed to :  Continue current level of physical activity   Pharmacotherapy We discussed various medication options to help Seth Fields with his weight loss efforts and we both agreed to : continue current anti-obesity medication regimen  ASSOCIATED CONDITIONS ADDRESSED TODAY  Obesity with current BMI 29.1 Assessment & Plan: So far Seth Fields has lost 10 % of body weight.  Body fat percentage has gone down to 24 % he has been playing tennis and doing strengthening with a certified fitness trainer.  We discussed the importance of getting 25 to 40 g of protein with each meal to preserve muscle mass.  He will continue on metformin for insulin resistance and incretin  effect.  His weight circumference is down by 1 inch.     Insulin resistance Assessment & Plan: His HOMA-IR is 5.28 which is significant. Optimal level < 1.9. This is complex condition associated with genetics, ectopic fat and lifestyle factors. Insulin resistance may result in weight  gain, abnormal cravings (particularly for carbs) and fatigue. This may result in additional weight gain and lead to pre-diabetes and diabetes if untreated.   Lab Results  Component Value Date   HGBA1C 5.0 06/18/2022   Lab Results  Component Value Date   INSULIN 23.4 06/18/2022   Lab Results  Component Value Date   GLUCOSE 93 06/18/2022   GLUCOSE 102 (H) 06/20/2011    He is currently on metformin twice a day tolerating medication well without any adverse effects.  He will continue to work on reducing simple and added sugars in his diet and nutritional strategies for weight loss.  We will check fasting blood glucose and insulin levels in the near future.    Orders: -     metFORMIN HCl ER; Take 1 tablet (500 mg total) by mouth 2 (two) times daily with a meal.  Dispense: 180 tablet; Refill: 0  Crohn's disease with complication, unspecified gastrointestinal tract location [facility]) Assessment & Plan: Patient has Crohn's disease which appears to be stable.  He is on immunomodulating drugs which may cause weight gain.  He is tolerating metformin twice a day without any adverse effects.  Metformin has been shown to improve gut microbiome which may be beneficial for inflammatory bowel disease.  He will continue medication.   Polypharmacy Assessment & Plan: Patient on several medications that may contribute to weight gain and affecting weight loss efforts.  Continue metformin to offset these effects.     PHYSICAL EXAM:  Blood pressure 119/76, pulse 77, temperature 97.6 F (36.4 C), height 5\' 11"  (1.803 m), weight 203 lb (92.1 kg), SpO2 97 %. Body mass index is 28.31 kg/m.  General: He is overweight, cooperative, alert, well developed, and in no acute distress. PSYCH: Has normal mood, affect and thought process.   HEENT: EOMI, sclerae are anicteric. Lungs: Normal breathing effort, no conversational dyspnea. Extremities: No edema.  Neurologic: No gross sensory or motor deficits. No  tremors or fasciculations noted.    DIAGNOSTIC DATA REVIEWED:  BMET    Component Value Date/Time   NA 139 06/18/2022 0919   K 4.8 06/18/2022 0919   CL 101 06/18/2022 0919   CO2 23 06/18/2022 0919   GLUCOSE 93 06/18/2022 0919   GLUCOSE 98 01/27/2015 1715   BUN 16 06/18/2022 0919   CREATININE 1.05 06/18/2022 0919   CREATININE 1.28 08/08/2012 1555   CALCIUM 10.2 06/18/2022 0919   GFRNONAA >60 01/27/2015 1715   GFRAA >60 01/27/2015 1715   Lab Results  Component Value Date   HGBA1C 5.0 06/18/2022   Lab Results  Component Value Date   INSULIN 23.4 06/18/2022   Lab Results  Component Value Date   TSH 2.000 06/18/2022   CBC    Component Value Date/Time   WBC 10.2 01/27/2015 1715   RBC 5.58 01/27/2015 1715   HGB 16.6 01/27/2015 1715   HCT 48.0 01/27/2015 1715   PLT 303 01/27/2015 1715   MCV 86.0 01/27/2015 1715   MCV 79.1 (A) 08/08/2012 1611   MCH 29.7 01/27/2015 1715   MCHC 34.6 01/27/2015 1715   RDW 13.8 01/27/2015 1715   Iron Studies No results found for: "IRON", "TIBC", "FERRITIN", "IRONPCTSAT" Lipid Panel  Component Value Date/Time   CHOL 198 06/18/2022 0919   TRIG 223 (H) 06/18/2022 0919   HDL 41 06/18/2022 0919   LDLCALC 118 (H) 06/18/2022 0919   Hepatic Function Panel     Component Value Date/Time   PROT 7.3 06/18/2022 0919   ALBUMIN 4.8 06/18/2022 0919   AST 28 06/18/2022 0919   ALT 40 06/18/2022 0919   ALKPHOS 86 06/18/2022 0919   BILITOT 0.5 06/18/2022 0919   BILIDIR <0.1 07/24/2011 2332   IBILI NOT CALCULATED 07/24/2011 2332      Component Value Date/Time   TSH 2.000 06/18/2022 0919   Nutritional Lab Results  Component Value Date   VD25OH 38.6 06/18/2022     Return in about 5 weeks (around 01/22/2023) for For Weight Mangement with Dr. Rikki Spearing.Marland Kitchen He was informed of the importance of frequent follow up visits to maximize his success with intensive lifestyle modifications for his multiple health conditions.   ATTESTASTION  STATEMENTS:  Reviewed by clinician on day of visit: allergies, medications, problem list, medical history, surgical history, family history, social history, and previous encounter notes.     Worthy Rancher, MD

## 2022-12-19 ENCOUNTER — Ambulatory Visit (INDEPENDENT_AMBULATORY_CARE_PROVIDER_SITE_OTHER): Payer: BC Managed Care – PPO | Admitting: Family Medicine

## 2022-12-19 ENCOUNTER — Encounter (HOSPITAL_BASED_OUTPATIENT_CLINIC_OR_DEPARTMENT_OTHER): Payer: Self-pay | Admitting: Family Medicine

## 2022-12-19 VITALS — BP 105/80 | HR 92 | Ht 71.0 in | Wt 210.7 lb

## 2022-12-19 DIAGNOSIS — K50919 Crohn's disease, unspecified, with unspecified complications: Secondary | ICD-10-CM

## 2022-12-19 DIAGNOSIS — G43909 Migraine, unspecified, not intractable, without status migrainosus: Secondary | ICD-10-CM

## 2022-12-19 DIAGNOSIS — Z79899 Other long term (current) drug therapy: Secondary | ICD-10-CM

## 2022-12-19 DIAGNOSIS — Z0000 Encounter for general adult medical examination without abnormal findings: Secondary | ICD-10-CM

## 2022-12-19 DIAGNOSIS — J4520 Mild intermittent asthma, uncomplicated: Secondary | ICD-10-CM

## 2022-12-19 DIAGNOSIS — E7800 Pure hypercholesterolemia, unspecified: Secondary | ICD-10-CM

## 2022-12-19 MED ORDER — ALBUTEROL SULFATE HFA 108 (90 BASE) MCG/ACT IN AERS: 2.0000 | INHALATION_SPRAY | Freq: Four times a day (QID) | RESPIRATORY_TRACT | 3 refills | Status: DC | PRN

## 2022-12-19 MED ORDER — SUMATRIPTAN SUCCINATE 50 MG PO TABS
50.0000 mg | ORAL_TABLET | ORAL | 1 refills | Status: DC | PRN
Start: 2022-12-19 — End: 2023-07-07

## 2022-12-19 NOTE — Assessment & Plan Note (Signed)
Routine HCM labs reviewed - labs with GI and with HWW, overall reassuring. HCM reviewed/discussed. Anticipatory guidance regarding healthy weight, lifestyle and choices given. Recommend healthy diet.  Recommend approximately 150 minutes/week of moderate intensity exercise Recommend regular dental and vision exams Always use seatbelt/lap and shoulder restraints Recommend using smoke alarms and checking batteries at least twice a year Recommend using sunscreen when outside Discussed immunization recommendations

## 2022-12-19 NOTE — Patient Instructions (Signed)
  Medication Instructions:  Your physician recommends that you continue on your current medications as directed. Please refer to the Current Medication list given to you today. --If you need a refill on any your medications before your next appointment, please call your pharmacy first. If no refills are authorized on file call the office.-- Lab Work: Your physician has recommended that you have lab work today: No If you have labs (blood work) drawn today and your tests are completely normal, you will receive your results via MyChart message OR a phone call from our staff.  Please ensure you check your voicemail in the event that you authorized detailed messages to be left on a delegated number. If you have any lab test that is abnormal or we need to change your treatment, we will call you to review the results.  Referrals/Procedures/Imaging: No  Follow-Up: Your next appointment:   Your physician recommends that you schedule a follow-up appointment in 6 months with Dr. de [place].  You will receive a text message or e-mail with a link to a survey about your care and experience with us today! We would greatly appreciate your feedback!   Thanks for letting us be apart of your health journey!!  Primary Care and Sports Medicine   Dr. Raymond de Cuba   We encourage you to activate your patient portal called "MyChart".  Sign up information is provided on this After Visit Summary.  MyChart is used to connect with patients for Virtual Visits (Telemedicine).  Patients are able to view lab/test results, encounter notes, upcoming appointments, etc.  Non-urgent messages can be sent to your provider as well. To learn more about what you can do with MyChart, please visit --  [email or web address].    

## 2022-12-19 NOTE — Progress Notes (Signed)
Subjective:    CC: Annual Physical Exam  HPI:  Seth Fields is a 32 y.o. presenting for annual physical  I reviewed the past medical history, family history, social history, surgical history, and allergies today and no changes were needed.  Please see the problem list section below in epic for further details.  Past Medical History: Past Medical History:  Diagnosis Date   ADD (attention deficit disorder)    Alcohol abuse    Allergy    Anemia    Anxiety    Asthma    Pt reports this is resolved   Bipolar affective (HCC)    Crohn's disease (HCC)    Depression    Drug use    Gluten intolerance    High cholesterol    Schizo affective schizophrenia (HCC)    Stomach ulcer    Past Surgical History: Past Surgical History:  Procedure Laterality Date   BOWEL RESECTION     SMALL INTESTINE SURGERY     Social History: Social History   Socioeconomic History   Marital status: Single    Spouse name: n/a   Number of children: 0   Years of education: Not on file   Highest education level: Not on file  Occupational History   Occupation: Consulting civil engineer    Comment: English and Occupational hygienist at [employer]  Tobacco Use   Smoking status: Some Days    Packs/day: 0.00    Years: 12.00    Additional pack years: 0.00    Total pack years: 0.00    Types: Cigarettes   Smokeless tobacco: Former   Tobacco comments:    Smokes 1 cig a day  Substance and Sexual Activity   Alcohol use: Yes    Comment: "every now and then," 1-2 beers every other week   Drug use: No    Comment: Patient reports he used "molly" and cocaine.    Sexual activity: Not on file  Other Topics Concern   Not on file  Social History Narrative   Lives alone, with caregiver support.   His family lives in [place], [place].   Social Determinants of Health   Financial Resource Strain: Not on file  Food Insecurity: Not on file  Transportation Needs: Not on file  Physical Activity: Not on file  Stress: Not on  file  Social Connections: Not on file   Family History: Family History  Problem Relation Age of Onset   High Cholesterol Mother    Thyroid disease Mother    Cancer Mother    High blood pressure Father    High Cholesterol Father    Cancer Father    Sleep apnea Father    Obesity Father    Allergies: Allergies  Allergen Reactions   Cefaclor Dermatitis, Hives and Rash   Gluten Meal Other (See Comments)    Other reaction(s): Psychosis (intolerance)   Clozaril [Clozapine]     Muscles stiffened up   Gluten    Octacosanol    Medications: See med rec.  Review of Systems: No headache, visual changes, nausea, vomiting, diarrhea, constipation, dizziness, abdominal pain, skin rash, fevers, chills, night sweats, swollen lymph nodes, weight loss, chest pain, body aches, joint swelling, muscle aches, shortness of breath, mood changes, visual or auditory hallucinations.  Objective:    BP 105/80 (BP Location: Left Arm, Patient Position: Sitting, Cuff Size: Normal)   Pulse 92   Ht 5\' 11"  (1.803 m)   Wt 210 lb 11.2 oz (95.6 kg)   SpO2 96% Comment: RA  BMI 29.39 kg/m   General: Well Developed, well nourished, and in no acute distress. Neuro: Alert and oriented x3, extra-ocular muscles intact, sensation grossly intact. Cranial nerves II through XII are intact, motor, sensory, and coordinative functions are all intact. HEENT: Normocephalic, atraumatic, pupils equal round reactive to light, neck supple, no masses, no lymphadenopathy, thyroid nonpalpable. Oropharynx, nasopharynx, external ear canals are unremarkable. Skin: Warm and dry, no rashes noted. Cardiac: Regular rate and rhythm, no murmurs rubs or gallops. Respiratory: Clear to auscultation bilaterally. Not using accessory muscles, speaking in full sentences. Abdominal: Soft, nontender, nondistended, positive bowel sounds, no masses, no organomegaly. Musculoskeletal: Shoulder, elbow, wrist, hip, knee, ankle stable, and with full range  of motion.  Impression and Recommendations:    Wellness examination Assessment & Plan: Routine HCM labs reviewed - labs with GI and with HWW, overall reassuring. HCM reviewed/discussed. Anticipatory guidance regarding healthy weight, lifestyle and choices given. Recommend healthy diet.  Recommend approximately 150 minutes/week of moderate intensity exercise Recommend regular dental and vision exams Always use seatbelt/lap and shoulder restraints Recommend using smoke alarms and checking batteries at least twice a year Recommend using sunscreen when outside Discussed immunization recommendations   Crohn's disease with complication, unspecified gastrointestinal tract location [facility]) Assessment & Plan: Patient mother had questions regarding possible antibiotics to have on hand given that patient will be traveling to [place] soon.  Discussed that typically antibiotics to have when traveling for potential traveler's diarrhea would generally not be indicated.  However, given patient's history of Crohn's disease, would recommend reaching out to GI specialist to determine if they feel it would be appropriate for him to have something on hand when traveling.   Migraine without status migrainosus, not intractable, unspecified migraine type -     SUMAtriptan Succinate; Take 1 tablet (50 mg total) by mouth as needed for migraine.  Dispense: 10 tablet; Refill: 1  Mild intermittent asthma, unspecified whether complicated -     Albuterol Sulfate HFA; Inhale 2 puffs into the lungs every 6 (six) hours as needed for wheezing.  Dispense: 2 each; Refill: 3  Return in about 6 months (around 06/20/2023) for HLD, weight.   ___________________________________________ Kiylah Loyer de [place], MD, ABFM, [facility]

## 2022-12-19 NOTE — Assessment & Plan Note (Signed)
Patient mother had questions regarding possible antibiotics to have on hand given that patient will be traveling to [place] soon.  Discussed that typically antibiotics to have when traveling for potential traveler's diarrhea would generally not be indicated.  However, given patient's history of Crohn's disease, would recommend reaching out to GI specialist to determine if they feel it would be appropriate for him to have something on hand when traveling.

## 2022-12-22 ENCOUNTER — Other Ambulatory Visit (HOSPITAL_COMMUNITY): Payer: Self-pay

## 2022-12-22 DIAGNOSIS — F250 Schizoaffective disorder, bipolar type: Secondary | ICD-10-CM | POA: Diagnosis not present

## 2023-01-01 ENCOUNTER — Other Ambulatory Visit: Payer: Self-pay

## 2023-01-01 ENCOUNTER — Other Ambulatory Visit (HOSPITAL_COMMUNITY): Payer: Self-pay

## 2023-01-05 ENCOUNTER — Other Ambulatory Visit: Payer: Self-pay

## 2023-01-05 DIAGNOSIS — F250 Schizoaffective disorder, bipolar type: Secondary | ICD-10-CM | POA: Diagnosis not present

## 2023-01-07 DIAGNOSIS — F250 Schizoaffective disorder, bipolar type: Secondary | ICD-10-CM | POA: Diagnosis not present

## 2023-01-09 ENCOUNTER — Encounter (HOSPITAL_BASED_OUTPATIENT_CLINIC_OR_DEPARTMENT_OTHER): Payer: BLUE CROSS/BLUE SHIELD | Admitting: Family Medicine

## 2023-01-15 ENCOUNTER — Other Ambulatory Visit: Payer: Self-pay

## 2023-01-17 ENCOUNTER — Other Ambulatory Visit (HOSPITAL_COMMUNITY): Payer: Self-pay

## 2023-01-21 ENCOUNTER — Other Ambulatory Visit (HOSPITAL_COMMUNITY): Payer: Self-pay

## 2023-01-21 DIAGNOSIS — F1010 Alcohol abuse, uncomplicated: Secondary | ICD-10-CM | POA: Diagnosis not present

## 2023-01-21 DIAGNOSIS — F1210 Cannabis abuse, uncomplicated: Secondary | ICD-10-CM | POA: Diagnosis not present

## 2023-01-21 DIAGNOSIS — F250 Schizoaffective disorder, bipolar type: Secondary | ICD-10-CM | POA: Diagnosis not present

## 2023-01-21 DIAGNOSIS — Z5181 Encounter for therapeutic drug level monitoring: Secondary | ICD-10-CM | POA: Diagnosis not present

## 2023-01-21 DIAGNOSIS — F3181 Bipolar II disorder: Secondary | ICD-10-CM | POA: Diagnosis not present

## 2023-01-21 MED ORDER — ABILIFY MAINTENA 400 MG IM PRSY
400.0000 mg | PREFILLED_SYRINGE | INTRAMUSCULAR | 2 refills | Status: DC
  Filled 2023-01-21 – 2023-01-24 (×2): qty 1, 30d supply, fill #0
  Filled 2023-02-09: qty 1, 30d supply, fill #1
  Filled 2023-02-23: qty 1, 30d supply, fill #2
  Filled 2023-02-23: qty 1, 30d supply, fill #1
  Filled 2023-03-27: qty 1, 30d supply, fill #2

## 2023-01-22 ENCOUNTER — Ambulatory Visit (INDEPENDENT_AMBULATORY_CARE_PROVIDER_SITE_OTHER): Payer: BC Managed Care – PPO | Admitting: Internal Medicine

## 2023-01-22 ENCOUNTER — Other Ambulatory Visit (HOSPITAL_COMMUNITY): Payer: Self-pay

## 2023-01-22 ENCOUNTER — Encounter (INDEPENDENT_AMBULATORY_CARE_PROVIDER_SITE_OTHER): Payer: Self-pay | Admitting: Internal Medicine

## 2023-01-22 VITALS — BP 103/71 | HR 96 | Temp 97.9°F | Ht 71.0 in | Wt 199.0 lb

## 2023-01-22 DIAGNOSIS — Z79899 Other long term (current) drug therapy: Secondary | ICD-10-CM | POA: Diagnosis not present

## 2023-01-22 DIAGNOSIS — Z6827 Body mass index (BMI) 27.0-27.9, adult: Secondary | ICD-10-CM

## 2023-01-22 DIAGNOSIS — E88819 Insulin resistance, unspecified: Secondary | ICD-10-CM | POA: Diagnosis not present

## 2023-01-22 DIAGNOSIS — E669 Obesity, unspecified: Secondary | ICD-10-CM

## 2023-01-22 DIAGNOSIS — E66811 Obesity, class 1: Secondary | ICD-10-CM

## 2023-01-22 MED ORDER — METFORMIN HCL ER 500 MG PO TB24
500.0000 mg | ORAL_TABLET | Freq: Two times a day (BID) | ORAL | 0 refills | Status: DC
Start: 2023-01-22 — End: 2023-02-18

## 2023-01-22 NOTE — Assessment & Plan Note (Signed)
So far Laramie has lost 12 % of total body weight.  Body fat percentage has gone down to 23 % he has been playing tennis and doing strengthening with a certified fitness trainer.  We discussed the importance of getting 25 to 40 g of protein with each meal to preserve muscle mass.  He will continue on metformin for insulin resistance and incretin effect.  His weight circumference is down by 1 inch.  We discussed about ways to increase fibrous foods.

## 2023-01-22 NOTE — Assessment & Plan Note (Signed)
His HOMA-IR is 5.28 which is significant. Optimal level < 1.9. This is complex condition associated with genetics, ectopic fat and lifestyle factors. Insulin resistance may result in weight gain, abnormal cravings (particularly for carbs) and fatigue. This may result in additional weight gain and lead to pre-diabetes and diabetes if untreated.   Lab Results  Component Value Date   HGBA1C 5.0 06/18/2022   Lab Results  Component Value Date   INSULIN 23.4 06/18/2022   Lab Results  Component Value Date   GLUCOSE 93 06/18/2022   GLUCOSE 102 (H) 06/20/2011    He is currently on metformin twice a day tolerating medication well without any adverse effects.  He will continue to work on reducing simple and added sugars in his diet and nutritional strategies for weight loss.  We will check fasting blood glucose and insulin levels in the near future.  We will also check B12 levels due to treatment with metformin.  He will continue metformin at current dose.

## 2023-01-22 NOTE — Progress Notes (Signed)
Office: [phone]  /  Fax: [phone]  WEIGHT SUMMARY AND BIOMETRICS  Vitals Temp: 97.9 F (36.6 C) BP: 103/71 Pulse Rate: 96 SpO2: 96 %   Anthropometric Measurements Height: 5\' 11"  (1.803 m) Weight: 199 lb (90.3 kg) BMI (Calculated): 27.77 Weight at Last Visit: 203 lb Weight Lost Since Last Visit: 4 lb Weight Gained Since Last Visit: 0 Starting Weight: 210 lb Total Weight Loss (lbs): 11 lb (4.99 kg) Peak Weight: 227 lb   Body Composition  Body Fat %: 23.4 % Fat Mass (lbs): 46.8 lbs Muscle Mass (lbs): 145.4 lbs Total Body Water (lbs): 98 lbs Visceral Fat Rating : 8    No data recorded Today's Visit #: 10  Starting Date: 06/18/22   HPI  Chief Complaint: OBESITY  Seth Fields is here to discuss his progress with his obesity treatment plan. He is on the keeping a food journal and adhering to recommended goals of 1200 calories and 90 protein and states he is following his eating plan approximately 90 % of the time. He states he is exercising tennis, running on treadmill 40 minutes 5 times per week.  Interval History:  Since last office visit he has lost 4 pounds.  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  He acknowledges not liking to eat vegetables but has been eating more fruit he is also try to get the recommended amount of protein.  He is currently on metformin twice a day for incretin effect and denies any adverse effects.  His parents have been trying to help him increase vegetables in his diet.  He is playing tennis on a regular basis and will be considering coaching at the [facility].  Seth Fields Control: Denies problems with appetite and hunger signals.  Denies problems with satiety and satiation.  Denies problems with eating patterns and portion control.  Denies abnormal cravings. Denies feeling deprived or restricted.   Barriers identified: having difficulty with meal prep and planning.    Pharmacotherapy for weight loss: He is currently taking Metformin (off label use for incretin effect and / or insulin resistance and / or diabetes prevention) with adequate clinical response  and without side effects..    ASSESSMENT AND PLAN  TREATMENT PLAN FOR OBESITY:  Recommended Dietary Goals  Seth Fields is currently in the action stage of change. As such, his goal is to continue weight management plan. He has agreed to: continue current plan  Behavioral Intervention  We discussed the following Behavioral Modification Strategies today: increasing lean protein intake, decreasing simple carbohydrates , increasing vegetables, increasing lower glycemic fruits, increasing fiber rich foods, increasing water intake, work on meal planning and preparation, planning for success, and we discussed different options to increase vegetables in his diet .  Additional resources provided today: Personalized instruction on the use of artificial intelligence for recipes, calorie tracking, and finding healthier options when eating out.   Recommended Physical Activity Goals  Seth Fields has been advised to work up to 150 minutes of moderate intensity aerobic activity a week and strengthening exercises 2-3 times per week for cardiovascular health, weight loss maintenance and preservation of muscle mass.   He has agreed to :  Continue current level of physical activity   Pharmacotherapy We discussed various medication options to help Seth Fields with his weight loss efforts and we both agreed to : continue current anti-obesity medication regimen  ASSOCIATED CONDITIONS ADDRESSED TODAY  Obesity with current BMI 29.1 Assessment & Plan: So far  Seth Fields has lost 12 % of total body weight.  Body fat percentage has gone down to 23 % he has been playing tennis and doing strengthening with a certified fitness trainer.  We discussed the importance of getting 25 to 40 g of protein with each meal to preserve muscle mass.  He  will continue on metformin for insulin resistance and incretin effect.  His weight circumference is down by 1 inch.  We discussed about ways to increase fibrous foods.   Insulin resistance Assessment & Plan: His HOMA-IR is 5.28 which is significant. Optimal level < 1.9. This is complex condition associated with genetics, ectopic fat and lifestyle factors. Insulin resistance may result in weight gain, abnormal cravings (particularly for carbs) and fatigue. This may result in additional weight gain and lead to pre-diabetes and diabetes if untreated.   Lab Results  Component Value Date   HGBA1C 5.0 06/18/2022   Lab Results  Component Value Date   INSULIN 23.4 06/18/2022   Lab Results  Component Value Date   GLUCOSE 93 06/18/2022   GLUCOSE 102 (H) 06/20/2011    He is currently on metformin twice a day tolerating medication well without any adverse effects.  He will continue to work on reducing simple and added sugars in his diet and nutritional strategies for weight loss.  We will check fasting blood glucose and insulin levels in the near future.  We will also check B12 levels due to treatment with metformin.  He will continue metformin at current dose.    Orders: -     metFORMIN HCl ER; Take 1 tablet (500 mg total) by mouth 2 (two) times daily with a meal.  Dispense: 180 tablet; Refill: 0  Polypharmacy    PHYSICAL EXAM:  Blood pressure 103/71, pulse 96, temperature 97.9 F (36.6 C), height 5\' 11"  (1.803 m), weight 199 lb (90.3 kg), SpO2 96%. Body mass index is 27.75 kg/m.  General: He is overweight, cooperative, alert, well developed, and in no acute distress. PSYCH: Has normal mood, affect and thought process.   HEENT: EOMI, sclerae are anicteric. Lungs: Normal breathing effort, no conversational dyspnea. Extremities: No edema.  Neurologic: No gross sensory or motor deficits. No tremors or fasciculations noted.    DIAGNOSTIC DATA REVIEWED:  BMET    Component Value  Date/Time   NA 139 06/18/2022 0919   K 4.8 06/18/2022 0919   CL 101 06/18/2022 0919   CO2 23 06/18/2022 0919   GLUCOSE 93 06/18/2022 0919   GLUCOSE 98 01/27/2015 1715   BUN 16 06/18/2022 0919   CREATININE 1.05 06/18/2022 0919   CREATININE 1.28 08/08/2012 1555   CALCIUM 10.2 06/18/2022 0919   GFRNONAA >60 01/27/2015 1715   GFRAA >60 01/27/2015 1715   Lab Results  Component Value Date   HGBA1C 5.0 06/18/2022   Lab Results  Component Value Date   INSULIN 23.4 06/18/2022   Lab Results  Component Value Date   TSH 2.000 06/18/2022   CBC    Component Value Date/Time   WBC 10.2 01/27/2015 1715   RBC 5.58 01/27/2015 1715   HGB 16.6 01/27/2015 1715   HCT 48.0 01/27/2015 1715   PLT 303 01/27/2015 1715   MCV 86.0 01/27/2015 1715   MCV 79.1 (A) 08/08/2012 1611   MCH 29.7 01/27/2015 1715   MCHC 34.6 01/27/2015 1715   RDW 13.8 01/27/2015 1715   Iron Studies No results found for: "IRON", "TIBC", "FERRITIN", "IRONPCTSAT" Lipid Panel     Component Value Date/Time  CHOL 198 06/18/2022 0919   TRIG 223 (H) 06/18/2022 0919   HDL 41 06/18/2022 0919   LDLCALC 118 (H) 06/18/2022 0919   Hepatic Function Panel     Component Value Date/Time   PROT 7.3 06/18/2022 0919   ALBUMIN 4.8 06/18/2022 0919   AST 28 06/18/2022 0919   ALT 40 06/18/2022 0919   ALKPHOS 86 06/18/2022 0919   BILITOT 0.5 06/18/2022 0919   BILIDIR <0.1 07/24/2011 2332   IBILI NOT CALCULATED 07/24/2011 2332      Component Value Date/Time   TSH 2.000 06/18/2022 0919   Nutritional Lab Results  Component Value Date   VD25OH 38.6 06/18/2022     Return in about 4 weeks (around 02/19/2023) for For Weight Mangement with Dr. Rikki Spearing.Marland Kitchen He was informed of the importance of frequent follow up visits to maximize his success with intensive lifestyle modifications for his multiple health conditions.   ATTESTASTION STATEMENTS:  Reviewed by clinician on day of visit: allergies, medications, problem list, medical  history, surgical history, family history, social history, and previous encounter notes.     Worthy Rancher, MD

## 2023-01-24 ENCOUNTER — Other Ambulatory Visit (HOSPITAL_COMMUNITY): Payer: Self-pay

## 2023-01-26 ENCOUNTER — Other Ambulatory Visit: Payer: Self-pay

## 2023-01-27 ENCOUNTER — Other Ambulatory Visit (HOSPITAL_COMMUNITY): Payer: Self-pay

## 2023-01-27 ENCOUNTER — Other Ambulatory Visit: Payer: Self-pay

## 2023-02-02 DIAGNOSIS — F250 Schizoaffective disorder, bipolar type: Secondary | ICD-10-CM | POA: Diagnosis not present

## 2023-02-04 DIAGNOSIS — F250 Schizoaffective disorder, bipolar type: Secondary | ICD-10-CM | POA: Diagnosis not present

## 2023-02-09 ENCOUNTER — Other Ambulatory Visit (HOSPITAL_COMMUNITY): Payer: Self-pay

## 2023-02-16 DIAGNOSIS — F250 Schizoaffective disorder, bipolar type: Secondary | ICD-10-CM | POA: Diagnosis not present

## 2023-02-18 ENCOUNTER — Ambulatory Visit (INDEPENDENT_AMBULATORY_CARE_PROVIDER_SITE_OTHER): Payer: BC Managed Care – PPO | Admitting: Internal Medicine

## 2023-02-18 ENCOUNTER — Encounter (INDEPENDENT_AMBULATORY_CARE_PROVIDER_SITE_OTHER): Payer: Self-pay | Admitting: Internal Medicine

## 2023-02-18 VITALS — BP 119/69 | HR 82 | Temp 97.3°F | Ht 71.0 in | Wt 201.0 lb

## 2023-02-18 DIAGNOSIS — Z6828 Body mass index (BMI) 28.0-28.9, adult: Secondary | ICD-10-CM

## 2023-02-18 DIAGNOSIS — E669 Obesity, unspecified: Secondary | ICD-10-CM | POA: Diagnosis not present

## 2023-02-18 DIAGNOSIS — E88819 Insulin resistance, unspecified: Secondary | ICD-10-CM | POA: Diagnosis not present

## 2023-02-18 DIAGNOSIS — R948 Abnormal results of function studies of other organs and systems: Secondary | ICD-10-CM

## 2023-02-18 MED ORDER — METFORMIN HCL ER 500 MG PO TB24
500.0000 mg | ORAL_TABLET | Freq: Two times a day (BID) | ORAL | 0 refills | Status: DC
Start: 2023-02-18 — End: 2023-03-19

## 2023-02-18 NOTE — Assessment & Plan Note (Signed)
Patient has a slower than predicted metabolism. IC 1454 vs. calculated 2057.  May be related to medications.  This may contribute to weight gain, chronic fatigue and difficulty losing weight.  We reviewed measures to improve metabolism including not skipping meals, progressive strengthening exercises, increasing protein intake at every meal and maintaining adequate hydration and sleep.  Consider repeating IC with fasting labs in 2 months.  He has increased volume of physical activity.

## 2023-02-18 NOTE — Progress Notes (Signed)
Office: [phone]  /  Fax: [phone]  WEIGHT SUMMARY AND BIOMETRICS  Vitals Temp: (!) 97.3 F (36.3 C) BP: 119/69 Pulse Rate: 82 SpO2: 95 %   Anthropometric Measurements Height: 5\' 11"  (1.803 m) Weight: 201 lb (91.2 kg) BMI (Calculated): 28.05 Weight at Last Visit: 199 lb Weight Lost Since Last Visit: 0 lb Weight Gained Since Last Visit: 2 lb Starting Weight: 210 lb Total Weight Loss (lbs): 9 lb (4.082 kg) Peak Weight: 227 lb   Body Composition  Body Fat %: 23.9 % Fat Mass (lbs): 48.2 lbs Muscle Mass (lbs): 145.8 lbs Total Body Water (lbs): 99 lbs Visceral Fat Rating : 8    No data recorded Today's Visit #: 11  Starting Date: 06/06/22   HPI  Chief Complaint: OBESITY  Seth Fields is here to discuss his progress with his obesity treatment plan. He is on the keeping a food journal and adhering to recommended goals of 1200 calories and 90 protein and states he is following his eating plan approximately 90 % of the time. He states he is exercising 60 minutes 6 times per week.  Interval History:  Since last office visit he has gained 2 lbs. He reports increased appetite and portion size of his lunch. He reports good adherence to reduced calorie nutritional plan. He has been working on not skipping meals, increasing protein intake at every meal, eating more fruits, eating more vegetables, drinking more water, avoiding and / or reducing liquid calories, and continues to exercise more walking and jogging.   24 hr: cereal (tumeric flakes), yogurt ([employer]ik[employer]) Lunch: smoothie, Dinner: Dinner, trout, mashed potatoes and celery and orange.   [place] Control: Denies problems with appetite and hunger signals.  Denies problems with satiety and satiation.  Denies problems with eating patterns and portion control.  Denies abnormal cravings. Denies feeling deprived or restricted.   Barriers identified: presence of obesogenic drugs.   Pharmacotherapy for weight loss: He is  currently taking Metformin (off label use for incretin effect and / or insulin resistance and / or diabetes prevention) with adequate clinical response  and without side effects..    ASSESSMENT AND PLAN  TREATMENT PLAN FOR OBESITY:  Recommended Dietary Goals  [name] is currently in the action stage of change. As such, his goal is to continue weight management plan. He has agreed to: continue current plan  Behavioral Intervention  We discussed the following Behavioral Modification Strategies today: increasing lean protein intake, decreasing simple carbohydrates , increasing vegetables, increasing lower glycemic fruits, increasing fiber rich foods, increasing water intake, work on tracking and journaling calories using tracking application, and planning for success.  Additional resources provided today:  Handout on how to boost metabolism  Recommended Physical Activity Goals  [name] has been advised to work up to 150 minutes of moderate intensity aerobic activity a week and strengthening exercises 2-3 times per week for cardiovascular health, weight loss maintenance and preservation of muscle mass.   He has agreed to :  Continue current level of physical activity   Pharmacotherapy We discussed various medication options to help Gates with his weight loss efforts and we both agreed to : continue current anti-obesity medication regimen  ASSOCIATED CONDITIONS ADDRESSED TODAY  Abnormal metabolism Assessment & Plan: Patient has a slower than predicted metabolism. IC 1454 vs. calculated 2057.  May be related to medications.  This may contribute to weight gain, chronic fatigue and difficulty losing weight.  We reviewed measures to improve metabolism including not skipping meals, progressive strengthening exercises,  increasing protein intake at every meal and maintaining adequate hydration and sleep.  Consider repeating IC with fasting labs in 2 months.  He has increased volume of physical  activity.    Insulin resistance Assessment & Plan: His HOMA-IR is 5.28 which is significant. Optimal level < 1.9. This is complex condition associated with genetics, ectopic fat and lifestyle factors. Insulin resistance may result in weight gain, abnormal cravings (particularly for carbs) and fatigue. This may result in additional weight gain and lead to pre-diabetes and diabetes if untreated.   Lab Results  Component Value Date   HGBA1C 5.0 06/18/2022   Lab Results  Component Value Date   INSULIN 23.4 06/18/2022   Lab Results  Component Value Date   GLUCOSE 93 06/18/2022   GLUCOSE 102 (H) 06/20/2011    He is currently on metformin twice a day tolerating medication well without any adverse effects.  He will continue to work on reducing simple and added sugars in his diet and nutritional strategies for weight loss.  We will check fasting blood glucose and insulin levels in the near future.  We will also check B12 levels due to treatment with metformin.  He will continue metformin at current dose.    Orders: -     metFORMIN HCl ER; Take 1 tablet (500 mg total) by mouth 2 (two) times daily with a meal.  Dispense: 180 tablet; Refill: 0  Obesity with current BMI 29.1    PHYSICAL EXAM:  Blood pressure 119/69, pulse 82, temperature (!) 97.3 F (36.3 C), height 5\' 11"  (1.803 m), weight 201 lb (91.2 kg), SpO2 95%. Body mass index is 28.03 kg/m.  General: He is overweight, cooperative, alert, well developed, and in no acute distress. PSYCH: Has normal mood, affect and thought process.   HEENT: EOMI, sclerae are anicteric. Lungs: Normal breathing effort, no conversational dyspnea. Extremities: No edema.  Neurologic: No gross sensory or motor deficits. No tremors or fasciculations noted.    DIAGNOSTIC DATA REVIEWED:  BMET    Component Value Date/Time   NA 139 06/18/2022 0919   K 4.8 06/18/2022 0919   CL 101 06/18/2022 0919   CO2 23 06/18/2022 0919   GLUCOSE 93 06/18/2022  0919   GLUCOSE 98 01/27/2015 1715   BUN 16 06/18/2022 0919   CREATININE 1.05 06/18/2022 0919   CREATININE 1.28 08/08/2012 1555   CALCIUM 10.2 06/18/2022 0919   GFRNONAA >60 01/27/2015 1715   GFRAA >60 01/27/2015 1715   Lab Results  Component Value Date   HGBA1C 5.0 06/18/2022   Lab Results  Component Value Date   INSULIN 23.4 06/18/2022   Lab Results  Component Value Date   TSH 2.000 06/18/2022   CBC    Component Value Date/Time   WBC 10.2 01/27/2015 1715   RBC 5.58 01/27/2015 1715   HGB 16.6 01/27/2015 1715   HCT 48.0 01/27/2015 1715   PLT 303 01/27/2015 1715   MCV 86.0 01/27/2015 1715   MCV 79.1 (A) 08/08/2012 1611   MCH 29.7 01/27/2015 1715   MCHC 34.6 01/27/2015 1715   RDW 13.8 01/27/2015 1715   Iron Studies No results found for: "IRON", "TIBC", "FERRITIN", "IRONPCTSAT" Lipid Panel     Component Value Date/Time   CHOL 198 06/18/2022 0919   TRIG 223 (H) 06/18/2022 0919   HDL 41 06/18/2022 0919   LDLCALC 118 (H) 06/18/2022 0919   Hepatic Function Panel     Component Value Date/Time   PROT 7.3 06/18/2022 0919   ALBUMIN 4.8  06/18/2022 0919   AST 28 06/18/2022 0919   ALT 40 06/18/2022 0919   ALKPHOS 86 06/18/2022 0919   BILITOT 0.5 06/18/2022 0919   BILIDIR <0.1 07/24/2011 2332   IBILI NOT CALCULATED 07/24/2011 2332      Component Value Date/Time   TSH 2.000 06/18/2022 0919   Nutritional Lab Results  Component Value Date   VD25OH 38.6 06/18/2022     Return in about 4 weeks (around 03/18/2023) for For Weight Mangement with Dr. Rikki Spearing.Marland Kitchen He was informed of the importance of frequent follow up visits to maximize his success with intensive lifestyle modifications for his multiple health conditions.   ATTESTASTION STATEMENTS:  Reviewed by clinician on day of visit: allergies, medications, problem list, medical history, surgical history, family history, social history, and previous encounter notes.     Worthy Rancher, MD

## 2023-02-18 NOTE — Assessment & Plan Note (Signed)
 His HOMA-IR is 5.28 which is significant. Optimal level < 1.9. This is complex condition associated with genetics, ectopic fat and lifestyle factors. Insulin resistance may result in weight gain, abnormal cravings (particularly for carbs) and fatigue. This may result in additional weight gain and lead to pre-diabetes and diabetes if untreated.   Lab Results  Component Value Date   HGBA1C 5.0 06/18/2022   Lab Results  Component Value Date   INSULIN 23.4 06/18/2022   Lab Results  Component Value Date   GLUCOSE 93 06/18/2022   GLUCOSE 102 (H) 06/20/2011    He is currently on metformin twice a day tolerating medication well without any adverse effects.  He will continue to work on reducing simple and added sugars in his diet and nutritional strategies for weight loss.  We will check fasting blood glucose and insulin levels in the near future.  We will also check B12 levels due to treatment with metformin.  He will continue metformin at current dose.

## 2023-02-23 ENCOUNTER — Other Ambulatory Visit: Payer: Self-pay

## 2023-02-23 ENCOUNTER — Other Ambulatory Visit (HOSPITAL_COMMUNITY): Payer: Self-pay

## 2023-03-02 DIAGNOSIS — F250 Schizoaffective disorder, bipolar type: Secondary | ICD-10-CM | POA: Diagnosis not present

## 2023-03-04 DIAGNOSIS — F250 Schizoaffective disorder, bipolar type: Secondary | ICD-10-CM | POA: Diagnosis not present

## 2023-03-14 ENCOUNTER — Other Ambulatory Visit (HOSPITAL_COMMUNITY): Payer: Self-pay

## 2023-03-16 DIAGNOSIS — F250 Schizoaffective disorder, bipolar type: Secondary | ICD-10-CM | POA: Diagnosis not present

## 2023-03-18 DIAGNOSIS — F250 Schizoaffective disorder, bipolar type: Secondary | ICD-10-CM | POA: Diagnosis not present

## 2023-03-18 DIAGNOSIS — F1210 Cannabis abuse, uncomplicated: Secondary | ICD-10-CM | POA: Diagnosis not present

## 2023-03-18 DIAGNOSIS — F1010 Alcohol abuse, uncomplicated: Secondary | ICD-10-CM | POA: Diagnosis not present

## 2023-03-18 DIAGNOSIS — F3181 Bipolar II disorder: Secondary | ICD-10-CM | POA: Diagnosis not present

## 2023-03-18 DIAGNOSIS — Z5181 Encounter for therapeutic drug level monitoring: Secondary | ICD-10-CM | POA: Diagnosis not present

## 2023-03-19 ENCOUNTER — Ambulatory Visit (INDEPENDENT_AMBULATORY_CARE_PROVIDER_SITE_OTHER): Payer: BC Managed Care – PPO | Admitting: Internal Medicine

## 2023-03-19 ENCOUNTER — Encounter (INDEPENDENT_AMBULATORY_CARE_PROVIDER_SITE_OTHER): Payer: Self-pay | Admitting: Internal Medicine

## 2023-03-19 VITALS — BP 100/69 | HR 69 | Temp 97.5°F | Ht 71.0 in | Wt 200.0 lb

## 2023-03-19 DIAGNOSIS — R948 Abnormal results of function studies of other organs and systems: Secondary | ICD-10-CM

## 2023-03-19 DIAGNOSIS — E88819 Insulin resistance, unspecified: Secondary | ICD-10-CM | POA: Diagnosis not present

## 2023-03-19 DIAGNOSIS — T50905A Adverse effect of unspecified drugs, medicaments and biological substances, initial encounter: Secondary | ICD-10-CM | POA: Insufficient documentation

## 2023-03-19 DIAGNOSIS — E669 Obesity, unspecified: Secondary | ICD-10-CM

## 2023-03-19 DIAGNOSIS — R635 Abnormal weight gain: Secondary | ICD-10-CM | POA: Insufficient documentation

## 2023-03-19 DIAGNOSIS — Z6829 Body mass index (BMI) 29.0-29.9, adult: Secondary | ICD-10-CM

## 2023-03-19 MED ORDER — METFORMIN HCL ER 500 MG PO TB24
500.0000 mg | ORAL_TABLET | Freq: Two times a day (BID) | ORAL | 0 refills | Status: DC
Start: 2023-03-19 — End: 2023-04-27

## 2023-03-19 NOTE — Assessment & Plan Note (Signed)
His HOMA-IR is 5.28 which is significant. Optimal level < 1.9. This is complex condition associated with genetics, ectopic fat and lifestyle factors. Insulin resistance may result in weight gain, abnormal cravings (particularly for carbs) and fatigue. This may result in additional weight gain and lead to pre-diabetes and diabetes if untreated.   Lab Results  Component Value Date   HGBA1C 5.0 06/18/2022   Lab Results  Component Value Date   INSULIN 23.4 06/18/2022   Lab Results  Component Value Date   GLUCOSE 93 06/18/2022   GLUCOSE 102 (H) 06/20/2011    He is currently on metformin twice a day tolerating medication well without any adverse effects.  He will continue to work on reducing simple and added sugars in his diet and nutritional strategies for weight loss.  We will check fasting blood glucose and insulin levels at the next office visit.  We will also check B12 levels due to treatment with metformin.  He will continue metformin at current dose.

## 2023-03-19 NOTE — Progress Notes (Signed)
Office: [phone]  /  Fax: [phone]  WEIGHT SUMMARY AND BIOMETRICS  Vitals Temp: (!) 97.5 F (36.4 C) BP: 100/69 Pulse Rate: 69 SpO2: 94 %   Anthropometric Measurements Height: 5\' 11"  (1.803 m) Weight: 200 lb (90.7 kg) BMI (Calculated): 27.91 Weight at Last Visit: 201 lb Weight Lost Since Last Visit: 1 lb Weight Gained Since Last Visit: 0 lb Starting Weight: 210 lb Total Weight Loss (lbs): 10 lb (4.536 kg) Peak Weight: 227 lb   Body Composition  Body Fat %: 22.6 % Fat Mass (lbs): 45.4 lbs Muscle Mass (lbs): 147.8 lbs Total Body Water (lbs): 101.4 lbs Visceral Fat Rating : 8    No data recorded Today's Visit #: 12  Starting Date: 06/12/22   HPI Needs glycine Chief Complaint: OBESITY  Connor is here to discuss his progress with his obesity treatment plan. He is on the keeping a food journal and adhering to recommended goals of 1200 calories and 90 protein and states he is following his eating plan approximately 90 % of the time. He states he is exercising 45 minutes 5 times per week.  Interval History:  Since last office visit he has lost 1 pounds. He reports good adherence to reduced calorie nutritional plan. He has been working on not skipping meals, increasing protein intake at every meal, eating more fruits, eating more vegetables, drinking more water, avoiding and / or reducing liquid calories, avoiding or reducing simple and processed carbohydrates, making healthier choices, continues to exercise, and snacking less  Orexigenic Control: Reports problems with appetite and hunger signals at night which Denies problems with satiety and satiation.  Denies problems with eating patterns and portion control.  Denies abnormal cravings. Denies feeling deprived or restricted.   Barriers identified: strong hunger signals and appetite, medical comorbidities, presence of obesogenic drugs, and slow metabolism for age.   Pharmacotherapy for weight loss: He is  currently taking Metformin (off label use for incretin effect and / or insulin resistance and / or diabetes prevention) with adequate clinical response  and without side effects..    ASSESSMENT AND PLAN  TREATMENT PLAN FOR OBESITY:  Recommended Dietary Goals  Antjuan is currently in the action stage of change. As such, his goal is to continue weight management plan. He has agreed to: continue current plan  Behavioral Intervention  We discussed the following Behavioral Modification Strategies today: increasing lean protein intake, decreasing simple carbohydrates , increasing vegetables, increasing lower glycemic fruits, increasing fiber rich foods, increasing water intake, continue to practice mindfulness when eating, and planning for success.  Additional resources provided today: None  Recommended Physical Activity Goals  Dhairya has been advised to work up to 150 minutes of moderate intensity aerobic activity a week and strengthening exercises 2-3 times per week for cardiovascular health, weight loss maintenance and preservation of muscle mass.   He has agreed to :  Continue current level of physical activity   Pharmacotherapy We discussed various medication options to help Salesi with his weight loss efforts and we both agreed to : continue current anti-obesity medication regimen  ASSOCIATED CONDITIONS ADDRESSED TODAY  Abnormal metabolism Assessment & Plan: Patient has a slower than predicted metabolism. IC 1454 vs. calculated 2057.  May be related to medications.  This may contribute to weight gain, chronic fatigue and difficulty losing weight.   We reviewed measures to improve metabolism including not skipping meals, progressive strengthening exercises, increasing protein intake at every meal and maintaining adequate hydration and sleep.  Consider repeating IC  with fasting labs in 2 months.  He has increased volume of physical activity.    Insulin resistance Assessment &  Plan: His HOMA-IR is 5.28 which is significant. Optimal level < 1.9. This is complex condition associated with genetics, ectopic fat and lifestyle factors. Insulin resistance may result in weight gain, abnormal cravings (particularly for carbs) and fatigue. This may result in additional weight gain and lead to pre-diabetes and diabetes if untreated.   Lab Results  Component Value Date   HGBA1C 5.0 06/18/2022   Lab Results  Component Value Date   INSULIN 23.4 06/18/2022   Lab Results  Component Value Date   GLUCOSE 93 06/18/2022   GLUCOSE 102 (H) 06/20/2011    He is currently on metformin twice a day tolerating medication well without any adverse effects.  He will continue to work on reducing simple and added sugars in his diet and nutritional strategies for weight loss.  We will check fasting blood glucose and insulin levels at the next office visit.  We will also check B12 levels due to treatment with metformin.  He will continue metformin at current dose.    Orders: -     metFORMIN HCl ER; Take 1 tablet (500 mg total) by mouth 2 (two) times daily with a meal.  Dispense: 180 tablet; Refill: 0  Obesity with current BMI 29.1 Assessment & Plan: So far Richey has lost 25 pounds or 12 % of total body weight.  Body fat percentage has gone down to 22 % he has been playing tennis and doing strengthening with a certified fitness trainer.  He is on metformin for incretin effect and also offset weight gain associated with psychotropic medication.  He overall displays improved orixegenic control.  We again emphasized the importance of maintaining adequate hydration, getting 30 to 40 g of protein with each meal and continuing to engage in strengthening exercises.   Weight gain due to medication    PHYSICAL EXAM:  Blood pressure 100/69, pulse 69, temperature (!) 97.5 F (36.4 C), height 5\' 11"  (1.803 m), weight 200 lb (90.7 kg), SpO2 94%. Body mass index is 27.89 kg/m.  General: He is  overweight, cooperative, alert, well developed, and in no acute distress. PSYCH: Has normal mood, affect and thought process.   HEENT: EOMI, sclerae are anicteric. Lungs: Normal breathing effort, no conversational dyspnea. Extremities: No edema.  Neurologic: No gross sensory or motor deficits. No tremors or fasciculations noted.    DIAGNOSTIC DATA REVIEWED:  BMET    Component Value Date/Time   NA 139 06/18/2022 0919   K 4.8 06/18/2022 0919   CL 101 06/18/2022 0919   CO2 23 06/18/2022 0919   GLUCOSE 93 06/18/2022 0919   GLUCOSE 98 01/27/2015 1715   BUN 16 06/18/2022 0919   CREATININE 1.05 06/18/2022 0919   CREATININE 1.28 08/08/2012 1555   CALCIUM 10.2 06/18/2022 0919   GFRNONAA >60 01/27/2015 1715   GFRAA >60 01/27/2015 1715   Lab Results  Component Value Date   HGBA1C 5.0 06/18/2022   Lab Results  Component Value Date   INSULIN 23.4 06/18/2022   Lab Results  Component Value Date   TSH 2.000 06/18/2022   CBC    Component Value Date/Time   WBC 10.2 01/27/2015 1715   RBC 5.58 01/27/2015 1715   HGB 16.6 01/27/2015 1715   HCT 48.0 01/27/2015 1715   PLT 303 01/27/2015 1715   MCV 86.0 01/27/2015 1715   MCV 79.1 (A) 08/08/2012 1611   MCH 29.7  01/27/2015 1715   MCHC 34.6 01/27/2015 1715   RDW 13.8 01/27/2015 1715   Iron Studies No results found for: "IRON", "TIBC", "FERRITIN", "IRONPCTSAT" Lipid Panel     Component Value Date/Time   CHOL 198 06/18/2022 0919   TRIG 223 (H) 06/18/2022 0919   HDL 41 06/18/2022 0919   LDLCALC 118 (H) 06/18/2022 0919   Hepatic Function Panel     Component Value Date/Time   PROT 7.3 06/18/2022 0919   ALBUMIN 4.8 06/18/2022 0919   AST 28 06/18/2022 0919   ALT 40 06/18/2022 0919   ALKPHOS 86 06/18/2022 0919   BILITOT 0.5 06/18/2022 0919   BILIDIR <0.1 07/24/2011 2332   IBILI NOT CALCULATED 07/24/2011 2332      Component Value Date/Time   TSH 2.000 06/18/2022 0919   Nutritional Lab Results  Component Value Date   VD25OH  38.6 06/18/2022     Return in about 4 weeks (around 04/16/2023) for For Weight Mangement with Dr. Rikki Spearing - fasting on .Marland Kitchen He was informed of the importance of frequent follow up visits to maximize his success with intensive lifestyle modifications for his multiple health conditions.   ATTESTASTION STATEMENTS:  Reviewed by clinician on day of visit: allergies, medications, problem list, medical history, surgical history, family history, social history, and previous encounter notes.     Worthy Rancher, MD

## 2023-03-19 NOTE — Assessment & Plan Note (Signed)
Patient has a slower than predicted metabolism. IC 1454 vs. calculated 2057.  May be related to medications.  This may contribute to weight gain, chronic fatigue and difficulty losing weight.  We reviewed measures to improve metabolism including not skipping meals, progressive strengthening exercises, increasing protein intake at every meal and maintaining adequate hydration and sleep.  Consider repeating IC with fasting labs in 2 months.  He has increased volume of physical activity.

## 2023-03-19 NOTE — Assessment & Plan Note (Signed)
So far Seth Fields has lost 25 pounds or 12 % of total body weight.  Body fat percentage has gone down to 22 % he has been playing tennis and doing strengthening with a certified fitness trainer.  He is on metformin for incretin effect and also offset weight gain associated with psychotropic medication.  He overall displays improved orixegenic control.  We again emphasized the importance of maintaining adequate hydration, getting 30 to 40 g of protein with each meal and continuing to engage in strengthening exercises.

## 2023-03-23 DIAGNOSIS — Z85828 Personal history of other malignant neoplasm of skin: Secondary | ICD-10-CM | POA: Diagnosis not present

## 2023-03-23 DIAGNOSIS — D224 Melanocytic nevi of scalp and neck: Secondary | ICD-10-CM | POA: Diagnosis not present

## 2023-03-23 DIAGNOSIS — D485 Neoplasm of uncertain behavior of skin: Secondary | ICD-10-CM | POA: Diagnosis not present

## 2023-03-27 ENCOUNTER — Other Ambulatory Visit (HOSPITAL_COMMUNITY): Payer: Self-pay

## 2023-03-27 ENCOUNTER — Other Ambulatory Visit: Payer: Self-pay

## 2023-03-30 DIAGNOSIS — F250 Schizoaffective disorder, bipolar type: Secondary | ICD-10-CM | POA: Diagnosis not present

## 2023-04-01 DIAGNOSIS — K5080 Crohn's disease of both small and large intestine without complications: Secondary | ICD-10-CM | POA: Diagnosis not present

## 2023-04-01 DIAGNOSIS — K8050 Calculus of bile duct without cholangitis or cholecystitis without obstruction: Secondary | ICD-10-CM | POA: Diagnosis not present

## 2023-04-01 DIAGNOSIS — F250 Schizoaffective disorder, bipolar type: Secondary | ICD-10-CM | POA: Diagnosis not present

## 2023-04-13 DIAGNOSIS — F250 Schizoaffective disorder, bipolar type: Secondary | ICD-10-CM | POA: Diagnosis not present

## 2023-04-15 DIAGNOSIS — F902 Attention-deficit hyperactivity disorder, combined type: Secondary | ICD-10-CM | POA: Diagnosis not present

## 2023-04-15 DIAGNOSIS — F250 Schizoaffective disorder, bipolar type: Secondary | ICD-10-CM | POA: Diagnosis not present

## 2023-04-22 ENCOUNTER — Other Ambulatory Visit (HOSPITAL_COMMUNITY): Payer: Self-pay

## 2023-04-22 ENCOUNTER — Other Ambulatory Visit: Payer: Self-pay

## 2023-04-22 MED ORDER — ABILIFY MAINTENA 400 MG IM PRSY
400.0000 mg | PREFILLED_SYRINGE | INTRAMUSCULAR | 0 refills | Status: DC
  Filled 2023-04-22 – 2023-04-24 (×2): qty 1, 30d supply, fill #0

## 2023-04-23 ENCOUNTER — Other Ambulatory Visit (HOSPITAL_COMMUNITY): Payer: Self-pay

## 2023-04-23 ENCOUNTER — Other Ambulatory Visit: Payer: Self-pay

## 2023-04-24 ENCOUNTER — Encounter (HOSPITAL_COMMUNITY): Payer: Self-pay

## 2023-04-24 ENCOUNTER — Other Ambulatory Visit (HOSPITAL_COMMUNITY): Payer: Self-pay

## 2023-04-24 ENCOUNTER — Other Ambulatory Visit: Payer: Self-pay

## 2023-04-24 NOTE — Progress Notes (Signed)
Benefits Investigation Summary Prescription: Abilify Syringe Dispensing pharmacy: [facility] Copay amount: $0 Prior Auth and Med Assistance Notes: PA not required. No copay assistance needed at this time    :

## 2023-04-24 NOTE — Progress Notes (Signed)
Specialty Pharmacy Initial Fill Coordination Note  EDY KRANER is a 32 y.o. male contacted today regarding refills of specialty medication(s) Aripiprazole   Patient requested Delivery   Delivery date: 04/28/23   Verified address: [facility] [address]   Medication will be filled on 10/18 or 10/21.   Patient is aware of $0 copayment.

## 2023-04-27 ENCOUNTER — Other Ambulatory Visit: Payer: Self-pay

## 2023-04-27 ENCOUNTER — Ambulatory Visit (INDEPENDENT_AMBULATORY_CARE_PROVIDER_SITE_OTHER): Payer: BC Managed Care – PPO | Admitting: Internal Medicine

## 2023-04-27 ENCOUNTER — Encounter (INDEPENDENT_AMBULATORY_CARE_PROVIDER_SITE_OTHER): Payer: Self-pay | Admitting: Internal Medicine

## 2023-04-27 VITALS — BP 105/71 | HR 81 | Temp 97.6°F | Ht 71.0 in | Wt 198.0 lb

## 2023-04-27 DIAGNOSIS — E7800 Pure hypercholesterolemia, unspecified: Secondary | ICD-10-CM

## 2023-04-27 DIAGNOSIS — E88819 Insulin resistance, unspecified: Secondary | ICD-10-CM

## 2023-04-27 DIAGNOSIS — E66811 Obesity, class 1: Secondary | ICD-10-CM

## 2023-04-27 DIAGNOSIS — F250 Schizoaffective disorder, bipolar type: Secondary | ICD-10-CM | POA: Diagnosis not present

## 2023-04-27 DIAGNOSIS — R948 Abnormal results of function studies of other organs and systems: Secondary | ICD-10-CM

## 2023-04-27 DIAGNOSIS — E669 Obesity, unspecified: Secondary | ICD-10-CM

## 2023-04-27 DIAGNOSIS — Z6827 Body mass index (BMI) 27.0-27.9, adult: Secondary | ICD-10-CM

## 2023-04-27 DIAGNOSIS — Z5181 Encounter for therapeutic drug level monitoring: Secondary | ICD-10-CM

## 2023-04-27 DIAGNOSIS — F902 Attention-deficit hyperactivity disorder, combined type: Secondary | ICD-10-CM | POA: Diagnosis not present

## 2023-04-27 MED ORDER — METFORMIN HCL ER 500 MG PO TB24
500.0000 mg | ORAL_TABLET | Freq: Two times a day (BID) | ORAL | 0 refills | Status: DC
Start: 2023-04-27 — End: 2023-05-28

## 2023-04-27 NOTE — Assessment & Plan Note (Signed)
Stable.  Check hemoglobin A1c, fasting blood glucose and insulin levels this morning.  Continue metformin for pharmacoprophylaxis.

## 2023-04-27 NOTE — Assessment & Plan Note (Signed)
So far Seth Fields has lost 27 pounds or 12 % of total body weight.  His BMI is now 27 body fat percentage has gone down. He has been playing tennis and doing strengthening with a certified fitness trainer.  He is on metformin for incretin effect and also offset weight gain associated with psychotropic medication.  He overall displays improved orixegenic control.  We again emphasized the importance of maintaining adequate hydration, getting 30 to 40 g of protein with each meal and continuing to engage in strengthening exercises.

## 2023-04-27 NOTE — Assessment & Plan Note (Signed)
Patient has a slower than predicted metabolism. IC 1454 vs. calculated 2057.  May be related to medications.  This may contribute to weight gain, chronic fatigue and difficulty losing weight.   Continue with physical activity.  We discussed ensuring that he is getting 30 to 40 g of protein per meal per day.  Consider repeating IC in the future.

## 2023-04-27 NOTE — Progress Notes (Signed)
Office: [phone]  /  Fax: [phone]  WEIGHT SUMMARY AND BIOMETRICS  Vitals Temp: 97.6 F (36.4 C) BP: 105/71 Pulse Rate: 81 SpO2: 97 %   Anthropometric Measurements Height: 5\' 11"  (1.803 m) Weight: 198 lb (89.8 kg) BMI (Calculated): 27.63 Weight at Last Visit: 200 lb Weight Lost Since Last Visit: 2 lb Weight Gained Since Last Visit: 0 lb Starting Weight: 210 lb Total Weight Loss (lbs): 12 lb (5.443 kg) Peak Weight: 227 lb   Body Composition  Body Fat %: 23.7 % Fat Mass (lbs): 47 lbs Muscle Mass (lbs): 143.8 lbs Total Body Water (lbs): 98.8 lbs Visceral Fat Rating : 8    No data recorded Today's Visit #: 13  Starting Date: 06/12/22   HPI  Chief Complaint: OBESITY  Camp is here to discuss his progress with his obesity treatment plan. He is on the keeping a food journal and adhering to recommended goals of 1200 calories and 90 protein and states he is following his eating plan approximately 90 % of the time. He states he is exercising 40 minutes 4 times per week.  Interval History:  Since last office visit he has lost 2 pounds. He reports good adherence to reduced calorie nutritional plan. He has been working on reading food labels, not skipping meals, increasing protein intake at every meal, drinking more water, making healthier choices, reducing portion sizes, incorporating more whole foods, and consuming protein shakes once to twice a day  .  He has reduced simple and added sugars in his diet and sometimes misses them but does not bring them home.  Orexigenic Control: Denies problems with appetite and hunger signals.  Denies problems with satiety and satiation.  Denies problems with eating patterns and portion control.  Denies abnormal cravings. Denies feeling deprived or restricted.   Barriers identified: presence of obesogenic drugs and slow metabolism for age.   Pharmacotherapy for weight loss: He is currently taking Metformin (off label use  for incretin effect and / or insulin resistance and / or diabetes prevention) with adequate clinical response  and without side effects..    ASSESSMENT AND PLAN  TREATMENT PLAN FOR OBESITY:  Recommended Dietary Goals  Yousif is currently in the action stage of change. As such, his goal is to continue weight management plan. He has agreed to: continue current plan  Behavioral Intervention  We discussed the following Behavioral Modification Strategies today: continue to work on maintaining a reduced calorie state, getting the recommended amount of protein, incorporating whole foods, making healthy choices, staying well hydrated and practicing mindfulness when eating..  Additional resources provided today: None  Recommended Physical Activity Goals  Seath has been advised to work up to 150 minutes of moderate intensity aerobic activity a week and strengthening exercises 2-3 times per week for cardiovascular health, weight loss maintenance and preservation of muscle mass.   He has agreed to :  Continue current level of physical activity   Pharmacotherapy We discussed various medication options to help Tyee with his weight loss efforts and we both agreed to : continue current anti-obesity medication regimen  ASSOCIATED CONDITIONS ADDRESSED TODAY  Abnormal metabolism Assessment & Plan: Patient has a slower than predicted metabolism. IC 1454 vs. calculated 2057.  May be related to medications.  This may contribute to weight gain, chronic fatigue and difficulty losing weight.   Continue with physical activity.  We discussed ensuring that he is getting 30 to 40 g of protein per meal per day.  Consider repeating IC  in the future.    Insulin resistance Assessment & Plan: Stable.  Check hemoglobin A1c, fasting blood glucose and insulin levels this morning.  Continue metformin for pharmacoprophylaxis.  Orders: -     metFORMIN HCl ER; Take 1 tablet (500 mg total) by mouth 2 (two)  times daily with a meal.  Dispense: 180 tablet; Refill: 0 -     Hemoglobin A1c -     CMP14+EGFR -     Insulin, random  Obesity with current BMI 27 Assessment & Plan: So far Rocco has lost 27 pounds or 12 % of total body weight.  His BMI is now 27 body fat percentage has gone down. He has been playing tennis and doing strengthening with a certified fitness trainer.  He is on metformin for incretin effect and also offset weight gain associated with psychotropic medication.  He overall displays improved orixegenic control.  We again emphasized the importance of maintaining adequate hydration, getting 30 to 40 g of protein with each meal and continuing to engage in strengthening exercises.   Pure hypercholesterolemia Assessment & Plan: His lipid panel showed an LDL of 118 and triglycerides of 223.  This may be a result of spillover effect from adiposity and also nutritional.  I anticipate improvements with weight loss. Check lipid panel today. He does not warrant pharmacotherapy at present time based on risk   Orders: -     Lipid Panel With LDL/HDL Ratio  Therapeutic drug monitoring -     Vitamin B12    PHYSICAL EXAM:  Blood pressure 105/71, pulse 81, temperature 97.6 F (36.4 C), height 5\' 11"  (1.803 m), weight 198 lb (89.8 kg), SpO2 97%. Body mass index is 27.62 kg/m.  General: He is overweight, cooperative, alert, well developed, and in no acute distress. PSYCH: Has normal mood, affect and thought process.   HEENT: EOMI, sclerae are anicteric. Lungs: Normal breathing effort, no conversational dyspnea. Extremities: No edema.  Neurologic: No gross sensory or motor deficits. No tremors or fasciculations noted.    DIAGNOSTIC DATA REVIEWED:  BMET    Component Value Date/Time   NA 139 06/18/2022 0919   K 4.8 06/18/2022 0919   CL 101 06/18/2022 0919   CO2 23 06/18/2022 0919   GLUCOSE 93 06/18/2022 0919   GLUCOSE 98 01/27/2015 1715   BUN 16 06/18/2022 0919   CREATININE 1.05  06/18/2022 0919   CREATININE 1.28 08/08/2012 1555   CALCIUM 10.2 06/18/2022 0919   GFRNONAA >60 01/27/2015 1715   GFRAA >60 01/27/2015 1715   Lab Results  Component Value Date   HGBA1C 5.0 06/18/2022   Lab Results  Component Value Date   INSULIN 23.4 06/18/2022   Lab Results  Component Value Date   TSH 2.000 06/18/2022   CBC    Component Value Date/Time   WBC 10.2 01/27/2015 1715   RBC 5.58 01/27/2015 1715   HGB 16.6 01/27/2015 1715   HCT 48.0 01/27/2015 1715   PLT 303 01/27/2015 1715   MCV 86.0 01/27/2015 1715   MCV 79.1 (A) 08/08/2012 1611   MCH 29.7 01/27/2015 1715   MCHC 34.6 01/27/2015 1715   RDW 13.8 01/27/2015 1715   Iron Studies No results found for: "IRON", "TIBC", "FERRITIN", "IRONPCTSAT" Lipid Panel     Component Value Date/Time   CHOL 198 06/18/2022 0919   TRIG 223 (H) 06/18/2022 0919   HDL 41 06/18/2022 0919   LDLCALC 118 (H) 06/18/2022 0919   Hepatic Function Panel     Component Value Date/Time  PROT 7.3 06/18/2022 0919   ALBUMIN 4.8 06/18/2022 0919   AST 28 06/18/2022 0919   ALT 40 06/18/2022 0919   ALKPHOS 86 06/18/2022 0919   BILITOT 0.5 06/18/2022 0919   BILIDIR <0.1 07/24/2011 2332   IBILI NOT CALCULATED 07/24/2011 2332      Component Value Date/Time   TSH 2.000 06/18/2022 0919   Nutritional Lab Results  Component Value Date   VD25OH 38.6 06/18/2022     Return in about 4 weeks (around 05/25/2023) for For Weight Mangement with Dr. Rikki Spearing.Marland Kitchen He was informed of the importance of frequent follow up visits to maximize his success with intensive lifestyle modifications for his multiple health conditions.   ATTESTASTION STATEMENTS:  Reviewed by clinician on day of visit: allergies, medications, problem list, medical history, surgical history, family history, social history, and previous encounter notes.     Worthy Rancher, MD

## 2023-04-27 NOTE — Assessment & Plan Note (Signed)
His lipid panel showed an LDL of 118 and triglycerides of 223.  This may be a result of spillover effect from adiposity and also nutritional.  I anticipate improvements with weight loss. Check lipid panel today. He does not warrant pharmacotherapy at present time based on risk

## 2023-04-28 DIAGNOSIS — Z85828 Personal history of other malignant neoplasm of skin: Secondary | ICD-10-CM | POA: Diagnosis not present

## 2023-04-28 DIAGNOSIS — L812 Freckles: Secondary | ICD-10-CM | POA: Diagnosis not present

## 2023-04-28 DIAGNOSIS — D225 Melanocytic nevi of trunk: Secondary | ICD-10-CM | POA: Diagnosis not present

## 2023-04-28 DIAGNOSIS — D485 Neoplasm of uncertain behavior of skin: Secondary | ICD-10-CM | POA: Diagnosis not present

## 2023-04-28 DIAGNOSIS — L905 Scar conditions and fibrosis of skin: Secondary | ICD-10-CM | POA: Diagnosis not present

## 2023-04-28 LAB — CMP14+EGFR
ALT: 18 [IU]/L (ref 0–44)
AST: 20 [IU]/L (ref 0–40)
Albumin: 4.7 g/dL (ref 4.1–5.1)
Alkaline Phosphatase: 83 [IU]/L (ref 44–121)
BUN/Creatinine Ratio: 15 (ref 9–20)
BUN: 13 mg/dL (ref 6–20)
Bilirubin Total: 0.5 mg/dL (ref 0.0–1.2)
CO2: 22 mmol/L (ref 20–29)
Calcium: 10 mg/dL (ref 8.7–10.2)
Chloride: 101 mmol/L (ref 96–106)
Creatinine, Ser: 0.87 mg/dL (ref 0.76–1.27)
Globulin, Total: 2.8 g/dL (ref 1.5–4.5)
Glucose: 100 mg/dL — ABNORMAL HIGH (ref 70–99)
Potassium: 4.7 mmol/L (ref 3.5–5.2)
Sodium: 140 mmol/L (ref 134–144)
Total Protein: 7.5 g/dL (ref 6.0–8.5)
eGFR: 116 mL/min/{1.73_m2} (ref >59)

## 2023-04-28 LAB — INSULIN, RANDOM: INSULIN: 11.7 u[IU]/mL (ref 2.6–24.9)

## 2023-04-28 LAB — LIPID PANEL WITH LDL/HDL RATIO
Cholesterol, Total: 187 mg/dL (ref 100–199)
HDL: 39 mg/dL — ABNORMAL LOW (ref >39)
LDL Chol Calc (NIH): 111 mg/dL — ABNORMAL HIGH (ref 0–99)
LDL/HDL Ratio: 2.8 ratio (ref 0.0–3.6)
Triglycerides: 209 mg/dL — ABNORMAL HIGH (ref 0–149)
VLDL Cholesterol Cal: 37 mg/dL (ref 5–40)

## 2023-04-28 LAB — HEMOGLOBIN A1C
Est. average glucose Bld gHb Est-mCnc: 100 mg/dL
Hgb A1c MFr Bld: 5.1 % (ref 4.8–5.6)

## 2023-04-28 LAB — VITAMIN B12: Vitamin B-12: >2000 pg/mL — ABNORMAL HIGH (ref 232–1245)

## 2023-04-29 DIAGNOSIS — F250 Schizoaffective disorder, bipolar type: Secondary | ICD-10-CM | POA: Diagnosis not present

## 2023-05-11 DIAGNOSIS — F250 Schizoaffective disorder, bipolar type: Secondary | ICD-10-CM | POA: Diagnosis not present

## 2023-05-13 ENCOUNTER — Other Ambulatory Visit: Payer: Self-pay

## 2023-05-13 DIAGNOSIS — F250 Schizoaffective disorder, bipolar type: Secondary | ICD-10-CM | POA: Diagnosis not present

## 2023-05-13 DIAGNOSIS — F1210 Cannabis abuse, uncomplicated: Secondary | ICD-10-CM | POA: Diagnosis not present

## 2023-05-13 MED ORDER — ABILIFY MAINTENA 400 MG IM PRSY
PREFILLED_SYRINGE | INTRAMUSCULAR | 0 refills | Status: DC
  Filled 2023-05-19: qty 1, 28d supply, fill #0

## 2023-05-19 ENCOUNTER — Other Ambulatory Visit: Payer: Self-pay

## 2023-05-19 ENCOUNTER — Other Ambulatory Visit (HOSPITAL_COMMUNITY): Payer: Self-pay | Admitting: Pharmacy Technician

## 2023-05-19 ENCOUNTER — Other Ambulatory Visit (HOSPITAL_COMMUNITY): Payer: Self-pay

## 2023-05-19 NOTE — Progress Notes (Signed)
Specialty Pharmacy Refill Coordination Note  Seth Fields is a 32 y.o. male contacted today regarding refills of specialty medication(s) Aripiprazole   Patient requested Delivery   Delivery date: 05/25/23   Verified address: [facility] [address]   Medication will be filled on 05/22/23.

## 2023-05-22 ENCOUNTER — Other Ambulatory Visit: Payer: Self-pay

## 2023-05-22 ENCOUNTER — Other Ambulatory Visit (HOSPITAL_COMMUNITY): Payer: Self-pay

## 2023-05-25 ENCOUNTER — Other Ambulatory Visit: Payer: Self-pay

## 2023-05-25 DIAGNOSIS — F250 Schizoaffective disorder, bipolar type: Secondary | ICD-10-CM | POA: Diagnosis not present

## 2023-05-25 NOTE — Progress Notes (Signed)
Patient's insurance company called on 11.15.24  requesting we delay patient's upcoming Abilify Maintena refill due to excess quantity on hand. Advised that we are not able to verify this on our records alone since specialty did not start managing medication refills until recently. Specialty call center called [facility] to confirm quantity on hand but was unable to reach office staff. Advised insurance to also reach out to patient or office to confirm medication supply on hand. Pharmacy will fill and send medication to prevent any delays in patient therapy and try to call office again later.

## 2023-05-28 ENCOUNTER — Encounter (INDEPENDENT_AMBULATORY_CARE_PROVIDER_SITE_OTHER): Payer: Self-pay | Admitting: Internal Medicine

## 2023-05-28 ENCOUNTER — Ambulatory Visit (INDEPENDENT_AMBULATORY_CARE_PROVIDER_SITE_OTHER): Payer: BC Managed Care – PPO | Admitting: Internal Medicine

## 2023-05-28 VITALS — BP 107/73 | HR 78 | Temp 97.6°F | Ht 71.0 in | Wt 196.0 lb

## 2023-05-28 DIAGNOSIS — K50919 Crohn's disease, unspecified, with unspecified complications: Secondary | ICD-10-CM | POA: Diagnosis not present

## 2023-05-28 DIAGNOSIS — R948 Abnormal results of function studies of other organs and systems: Secondary | ICD-10-CM | POA: Diagnosis not present

## 2023-05-28 DIAGNOSIS — E669 Obesity, unspecified: Secondary | ICD-10-CM | POA: Diagnosis not present

## 2023-05-28 DIAGNOSIS — E88819 Insulin resistance, unspecified: Secondary | ICD-10-CM

## 2023-05-28 DIAGNOSIS — T50905A Adverse effect of unspecified drugs, medicaments and biological substances, initial encounter: Secondary | ICD-10-CM

## 2023-05-28 DIAGNOSIS — Z6827 Body mass index (BMI) 27.0-27.9, adult: Secondary | ICD-10-CM

## 2023-05-28 DIAGNOSIS — E66811 Obesity, class 1: Secondary | ICD-10-CM

## 2023-05-28 MED ORDER — METFORMIN HCL ER 500 MG PO TB24: 500.0000 mg | ORAL_TABLET | Freq: Two times a day (BID) | ORAL | 0 refills | Status: DC

## 2023-05-28 NOTE — Progress Notes (Signed)
Office: [phone]  /  Fax: [phone]  Weight Summary And Biometrics  Vitals Temp: 97.6 F (36.4 C) BP: 107/73 Pulse Rate: 78 SpO2: 100 %   Anthropometric Measurements Height: 5\' 11"  (1.803 m) Weight: 196 lb (88.9 kg) BMI (Calculated): 27.35 Weight at Last Visit: 198 lb Weight Lost Since Last Visit: 2 lb Weight Gained Since Last Visit: 0 lb Starting Weight: 210 lb Total Weight Loss (lbs): 14 lb (6.35 kg) Peak Weight: 227 lb   Body Composition  Body Fat %: 23.6 % Fat Mass (lbs): 46.4 lbs Muscle Mass (lbs): 142.8 lbs Total Body Water (lbs): 99 lbs Visceral Fat Rating : 8    No data recorded Today's Visit #: 14  Starting Date: 06/13/23   Subjective   Chief Complaint: Obesity  Seth Fields is here to discuss his progress with his obesity treatment plan. He is on the the Category 2 Plan and states he is following his eating plan approximately 90 % of the time. He states he is exercising 45 minutes 5 times per week.  Interval History:   Discussed the use of AI scribe software for clinical note transcription with the patient, who gave verbal consent to proceed.  History of Present Illness   Seth Fields, a patient with obesity, insulin resistance, and abnormal metabolism, presents for a follow-up on weight management. He reports adherence to a reduced-calorie nutrition plan about 90% of the time and engages in exercise for 45 minutes, five times per week. His bioimpedance information shows a reduction in body fat percentage to 23.6%, with a goal of less than 19%. His BMI is now 27, down from a peak weight of 225 pounds to 196 pounds, a total loss of 29 pounds. He has preserved muscle mass throughout this weight loss journey.  Seth Fields reports feeling good overall, with a slight increase in energy levels. He has been incorporating weightlifting into his exercise routine and has made efforts to reduce late-night snacking. 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  Seth Fields has a history of Crohn's disease, which has been relatively stable with only one flare reported in the past month. He also reports occasional constipation. His most recent colonoscopy showed "no further narrowing of the intestine", indicating improvement in his condition.  Seth Fields is on medication, including Abilify, which is known to affect weight loss and potentially cause insulin resistance and other metabolic derangements.  Despite this, he has managed to lose weight and improve his insulin resistance, as evidenced by a reduction in insulin levels by half. His A1C is 5.1, indicating good blood sugar Fields. However, his fasting blood sugar and triglycerides were slightly elevated at the last check, which could be due to the medication or dietary intake.  Seth Fields's goal is to reach a weight of 185 pounds. He has expressed a desire to continue monthly follow-ups to monitor his progress.      Seth Fields:  Denies problems with appetite and hunger signals.  Denies problems with satiety and satiation.  Denies problems with eating patterns and portion Fields.  Denies abnormal cravings. Denies feeling deprived or restricted.   Barriers identified: none.   Pharmacotherapy for weight loss: He is currently taking Metformin (off label use for incretin effect and / or insulin resistance and / or diabetes prevention) with adequate clinical response  and without side effects..   Assessment and Plan   Treatment Plan For Obesity:  Recommended Dietary Goals  Seth Fields is currently in the action stage of change.  As such, his goal is to continue weight management plan. He has agreed to: continue current plan  Behavioral Intervention  We discussed the following Behavioral Modification Strategies today: continue to work on maintaining a reduced calorie state, getting the recommended amount of protein, incorporating whole foods, making healthy choices, staying well  hydrated and practicing mindfulness when eating..  Additional resources provided today: Handout on traveling and holiday eating strategies  Recommended Physical Activity Goals  Seth Fields has been advised to work up to 150 minutes of moderate intensity aerobic activity a week and strengthening exercises 2-3 times per week for cardiovascular health, weight loss maintenance and preservation of muscle mass.   He has agreed to :  continue to gradually increase the amount and intensity of exercise   Pharmacotherapy  We discussed various medication options to help Seth Fields with his weight loss efforts and we both agreed to : continue current anti-obesity medication regimen  Associated Conditions Addressed Today  Assessment and Plan    Obesity  / abnormal metabolism He has successfully lost 29 lbs, reducing his BMI from 31 to 27, and now adheres to a reduced-calorie diet 90% of the time while exercising for 45 minutes five times per week. His body fat percentage has decreased to 23.6%, with a goal of achieving less than 19%. We emphasized the importance of gradual weight loss, adequate protein intake, and consistent physical activity to avoid plateauing. We discussed the risks of rapid weight loss, including metabolic slowdown, and the role of protein in boosting metabolic rate, recommending a protein intake of 110-120 grams per day to support muscle mass and continued weight loss. We will continue the reduced-calorie nutrition plan, maintain the exercise regimen, incorporate the recommended protein intake, and monitor weight and body fat percentage.  Insulin Resistance   His insulin resistance has shown improvement, with insulin levels reduced by half and an A1c of 5.1. His fasting blood sugar is 100, and triglycerides are slightly elevated, possibly due to dietary intake or medication effects (Abilify). The improvement is attributed to weight loss and consistent physical activity. We discussed the  potential impact of Abilify on blood sugar and triglycerides and stressed the importance of reducing simple carbohydrates. We will continue metformin, reduce intake of simple carbohydrates, and monitor fasting blood sugar and triglycerides.  His B12 levels were normal on metformin.  Crohn's Disease   His Crohn's disease is well-managed, with only one episode of stomach pain and constipation in the past month. A recent checkup with a gastroenterologist and colonoscopy confirmed no further narrowing of the intestine. We will continue the current management plan and monitor for symptoms of flare-ups.  Metformin has not caused any GI side effects.  General Health Maintenance   He is up to date on vaccinations, including COVID-19 and RSV. We will provide a handout on holiday health tips.  Follow-up   We will schedule a follow-up visit in four weeks and refill the metformin prescription at [facility].             Objective   Physical Exam:  Blood pressure 107/73, pulse 78, temperature 97.6 F (36.4 C), height 5\' 11"  (1.803 m), weight 196 lb (88.9 kg), SpO2 100%. Body mass index is 27.34 kg/m.  General: He is overweight, cooperative, alert, well developed, and in no acute distress. PSYCH: Has normal mood, affect and thought process.   HEENT: EOMI, sclerae are anicteric. Lungs: Normal breathing effort, no conversational dyspnea. Extremities: No edema.  Neurologic: No gross sensory or motor  deficits. No tremors or fasciculations noted.    Diagnostic Data Reviewed:  BMET    Component Value Date/Time   NA 140 04/27/2023 0832   K 4.7 04/27/2023 0832   CL 101 04/27/2023 0832   CO2 22 04/27/2023 0832   GLUCOSE 100 (H) 04/27/2023 0832   GLUCOSE 98 01/27/2015 1715   BUN 13 04/27/2023 0832   CREATININE 0.87 04/27/2023 0832   CREATININE 1.28 08/08/2012 1555   CALCIUM 10.0 04/27/2023 0832   GFRNONAA >60 01/27/2015 1715   GFRAA >60 01/27/2015 1715   Lab Results  Component  Value Date   HGBA1C 5.1 04/27/2023   HGBA1C 5.0 06/18/2022   Lab Results  Component Value Date   INSULIN 11.7 04/27/2023   INSULIN 23.4 06/18/2022   Lab Results  Component Value Date   TSH 2.000 06/18/2022   CBC    Component Value Date/Time   WBC 10.2 01/27/2015 1715   RBC 5.58 01/27/2015 1715   HGB 16.6 01/27/2015 1715   HCT 48.0 01/27/2015 1715   PLT 303 01/27/2015 1715   MCV 86.0 01/27/2015 1715   MCV 79.1 (A) 08/08/2012 1611   MCH 29.7 01/27/2015 1715   MCHC 34.6 01/27/2015 1715   RDW 13.8 01/27/2015 1715   Iron Studies No results found for: "IRON", "TIBC", "FERRITIN", "IRONPCTSAT" Lipid Panel     Component Value Date/Time   CHOL 187 04/27/2023 0832   TRIG 209 (H) 04/27/2023 0832   HDL 39 (L) 04/27/2023 0832   LDLCALC 111 (H) 04/27/2023 0832   Hepatic Function Panel     Component Value Date/Time   PROT 7.5 04/27/2023 0832   ALBUMIN 4.7 04/27/2023 0832   AST 20 04/27/2023 0832   ALT 18 04/27/2023 0832   ALKPHOS 83 04/27/2023 0832   BILITOT 0.5 04/27/2023 0832   BILIDIR <0.1 07/24/2011 2332   IBILI NOT CALCULATED 07/24/2011 2332      Component Value Date/Time   TSH 2.000 06/18/2022 0919   Nutritional Lab Results  Component Value Date   VD25OH 38.6 06/18/2022    Follow-Up   Return in about 4 weeks (around 06/25/2023) for For Weight Mangement with Dr. Rikki Spearing.Marland Kitchen He was informed of the importance of frequent follow up visits to maximize his success with intensive lifestyle modifications for his multiple health conditions.  Attestation Statement   Reviewed by clinician on day of visit: allergies, medications, problem list, medical history, surgical history, family history, social history, and previous encounter notes.     Worthy Rancher, MD

## 2023-06-16 ENCOUNTER — Other Ambulatory Visit (HOSPITAL_COMMUNITY): Payer: Self-pay

## 2023-06-16 ENCOUNTER — Other Ambulatory Visit (HOSPITAL_COMMUNITY): Payer: Self-pay | Admitting: Pharmacy Technician

## 2023-06-16 ENCOUNTER — Other Ambulatory Visit: Payer: Self-pay

## 2023-06-16 DIAGNOSIS — K50819 Crohn's disease of both small and large intestine with unspecified complications: Secondary | ICD-10-CM | POA: Diagnosis not present

## 2023-06-16 MED ORDER — ABILIFY MAINTENA 400 MG IM PRSY
PREFILLED_SYRINGE | INTRAMUSCULAR | 0 refills | Status: DC
  Filled 2023-06-16: qty 1, 30d supply, fill #0
  Filled 2023-07-15: qty 1, 28d supply, fill #0

## 2023-06-16 NOTE — Progress Notes (Signed)
Specialty Pharmacy Refill Coordination Note  Seth Fields is a 32 y.o. male contacted today regarding refills of specialty medication(s) Aripiprazole   Patient requested Delivery   Delivery date: 06/23/23   Verified address: [facility] [address]   Medication will be filled on 06/22/23.

## 2023-06-22 ENCOUNTER — Other Ambulatory Visit: Payer: Self-pay

## 2023-06-22 DIAGNOSIS — F250 Schizoaffective disorder, bipolar type: Secondary | ICD-10-CM | POA: Diagnosis not present

## 2023-06-23 ENCOUNTER — Ambulatory Visit (INDEPENDENT_AMBULATORY_CARE_PROVIDER_SITE_OTHER): Payer: BC Managed Care – PPO | Admitting: Family Medicine

## 2023-06-23 ENCOUNTER — Encounter (HOSPITAL_BASED_OUTPATIENT_CLINIC_OR_DEPARTMENT_OTHER): Payer: Self-pay | Admitting: Family Medicine

## 2023-06-23 VITALS — BP 106/73 | HR 93 | Ht 71.0 in | Wt 206.4 lb

## 2023-06-23 DIAGNOSIS — F1910 Other psychoactive substance abuse, uncomplicated: Secondary | ICD-10-CM | POA: Insufficient documentation

## 2023-06-23 DIAGNOSIS — K9041 Non-celiac gluten sensitivity: Secondary | ICD-10-CM | POA: Insufficient documentation

## 2023-06-23 DIAGNOSIS — Z0000 Encounter for general adult medical examination without abnormal findings: Secondary | ICD-10-CM

## 2023-06-23 DIAGNOSIS — Z8669 Personal history of other diseases of the nervous system and sense organs: Secondary | ICD-10-CM | POA: Insufficient documentation

## 2023-06-23 DIAGNOSIS — F1010 Alcohol abuse, uncomplicated: Secondary | ICD-10-CM | POA: Insufficient documentation

## 2023-06-23 DIAGNOSIS — J3089 Other allergic rhinitis: Secondary | ICD-10-CM | POA: Insufficient documentation

## 2023-06-23 DIAGNOSIS — E785 Hyperlipidemia, unspecified: Secondary | ICD-10-CM | POA: Diagnosis not present

## 2023-06-23 DIAGNOSIS — F909 Attention-deficit hyperactivity disorder, unspecified type: Secondary | ICD-10-CM | POA: Insufficient documentation

## 2023-06-23 DIAGNOSIS — E230 Hypopituitarism: Secondary | ICD-10-CM | POA: Insufficient documentation

## 2023-06-23 NOTE — Patient Instructions (Signed)
  Medication Instructions:  Your physician recommends that you continue on your current medications as directed. Please refer to the Current Medication list given to you today. --If you need a refill on any your medications before your next appointment, please call your pharmacy first. If no refills are authorized on file call the office.-- Lab Work: Your physician has recommended that you have lab work today: 1 week before next appointment If you have labs (blood work) drawn today and your tests are completely normal, you will receive your results via MyChart message OR a phone call from our staff.  Please ensure you check your voicemail in the event that you authorized detailed messages to be left on a delegated number. If you have any lab test that is abnormal or we need to change your treatment, we will call you to review the results.   Follow-Up: Your next appointment:   Your physician recommends that you schedule a follow-up appointment in: 6 month physical with Dr. de [place]  You will receive a text message or e-mail with a link to a survey about your care and experience with [place] today! We would greatly appreciate your feedback!   Thanks for letting us be apart of your health journey!!  Primary Care and Sports Medicine   Dr. Ceasar Mons [place]   We encourage you to activate your patient portal called "MyChart".  Sign up information is provided on this After Visit Summary.  MyChart is used to connect with patients for Virtual Visits (Telemedicine).  Patients are able to view lab/test results, encounter notes, upcoming appointments, etc.  Non-urgent messages can be sent to your provider as well. To learn more about what you can do with MyChart, please visit --  [email or web address].

## 2023-06-23 NOTE — Progress Notes (Signed)
    Procedures performed today:    None.  Independent interpretation of notes and tests performed by another provider:   None.  Brief History, Exam, Impression, and Recommendations:    BP 106/73 (BP Location: Right Arm, Patient Position: Sitting, Cuff Size: Normal)   Pulse 93   Ht 5\' 11"  (1.803 m)   Wt 206 lb 6.4 oz (93.6 kg)   SpO2 98%   BMI 28.79 kg/m   Hyperlipidemia, unspecified hyperlipidemia type Assessment & Plan: Recent lipid panel through healthy weight wellness clinic did show some improvement in both total cholesterol and LDL.  LDL remains slightly above normal range however.  He does continue to work on lifestyle modification, weight loss.  Has had notable weight loss working through healthy weight loss clinic. Recommend continuing with lifestyle modifications, continued follow-up with healthy weight and wellness We will plan to recheck labs prior to physical in about 6 months   Wellness examination -     CBC with Differential/Platelet; Future -     Comprehensive metabolic panel; Future -     Hemoglobin A1c; Future -     Lipid panel; Future -     TSH Rfx on Abnormal to Free T4; Future  Return in about 6 months (around 12/22/2023) for CPE with fasting labs 1 week prior.   ___________________________________________ Reco Shonk de [place], MD, ABFM, CAQSM Primary Care and Sports Medicine [facility]

## 2023-06-23 NOTE — Assessment & Plan Note (Signed)
Recent lipid panel through healthy weight wellness clinic did show some improvement in both total cholesterol and LDL.  LDL remains slightly above normal range however.  He does continue to work on lifestyle modification, weight loss.  Has had notable weight loss working through healthy weight loss clinic. Recommend continuing with lifestyle modifications, continued follow-up with healthy weight and wellness We will plan to recheck labs prior to physical in about 6 months

## 2023-06-24 DIAGNOSIS — F250 Schizoaffective disorder, bipolar type: Secondary | ICD-10-CM | POA: Diagnosis not present

## 2023-07-06 DIAGNOSIS — F250 Schizoaffective disorder, bipolar type: Secondary | ICD-10-CM | POA: Diagnosis not present

## 2023-07-07 ENCOUNTER — Other Ambulatory Visit (HOSPITAL_BASED_OUTPATIENT_CLINIC_OR_DEPARTMENT_OTHER): Payer: Self-pay | Admitting: Family Medicine

## 2023-07-07 DIAGNOSIS — G43909 Migraine, unspecified, not intractable, without status migrainosus: Secondary | ICD-10-CM

## 2023-07-09 ENCOUNTER — Ambulatory Visit (INDEPENDENT_AMBULATORY_CARE_PROVIDER_SITE_OTHER): Payer: BC Managed Care – PPO | Admitting: Internal Medicine

## 2023-07-09 ENCOUNTER — Encounter (INDEPENDENT_AMBULATORY_CARE_PROVIDER_SITE_OTHER): Payer: Self-pay | Admitting: Internal Medicine

## 2023-07-09 ENCOUNTER — Other Ambulatory Visit (HOSPITAL_BASED_OUTPATIENT_CLINIC_OR_DEPARTMENT_OTHER): Payer: Self-pay

## 2023-07-09 VITALS — BP 102/64 | HR 80 | Ht 71.0 in | Wt 199.0 lb

## 2023-07-09 DIAGNOSIS — R635 Abnormal weight gain: Secondary | ICD-10-CM | POA: Diagnosis not present

## 2023-07-09 DIAGNOSIS — Z6827 Body mass index (BMI) 27.0-27.9, adult: Secondary | ICD-10-CM

## 2023-07-09 DIAGNOSIS — T50905A Adverse effect of unspecified drugs, medicaments and biological substances, initial encounter: Secondary | ICD-10-CM

## 2023-07-09 DIAGNOSIS — R948 Abnormal results of function studies of other organs and systems: Secondary | ICD-10-CM

## 2023-07-09 DIAGNOSIS — F1210 Cannabis abuse, uncomplicated: Secondary | ICD-10-CM | POA: Diagnosis not present

## 2023-07-09 DIAGNOSIS — F902 Attention-deficit hyperactivity disorder, combined type: Secondary | ICD-10-CM | POA: Diagnosis not present

## 2023-07-09 DIAGNOSIS — E88819 Insulin resistance, unspecified: Secondary | ICD-10-CM

## 2023-07-09 DIAGNOSIS — E66811 Obesity, class 1: Secondary | ICD-10-CM

## 2023-07-09 DIAGNOSIS — F250 Schizoaffective disorder, bipolar type: Secondary | ICD-10-CM | POA: Diagnosis not present

## 2023-07-09 MED ORDER — METFORMIN HCL ER 500 MG PO TB24: 500.0000 mg | ORAL_TABLET | Freq: Two times a day (BID) | ORAL | 0 refills | Status: DC

## 2023-07-09 MED ORDER — ABILIFY MAINTENA 400 MG IM PRSY
400.0000 mg | PREFILLED_SYRINGE | INTRAMUSCULAR | 0 refills | Status: DC
  Filled 2023-07-09 – 2023-08-10 (×2): qty 1, 30d supply, fill #0
  Filled ????-??-??: fill #0

## 2023-07-09 NOTE — Assessment & Plan Note (Signed)
 His most recent hemoglobin A1c was 5.1 and insulin levels have decreased by 50% at 11.7 overall markers of improved glycemic control.  He will continue on metformin for pharmacoprophylaxis also to offset weight gain associated with psychotropic medication.

## 2023-07-09 NOTE — Assessment & Plan Note (Signed)
 Patient has a slower than predicted metabolism. IC 1454 vs. calculated 2057.  May be related to medications.  This may contribute to weight gain, chronic fatigue and difficulty losing weight.   Despite this he has managed to lose weight.  He engages in regular physical activity.  We again reviewed the importance of maintaining adequate protein intake and and maintaining a low glycemic load to avoid slowing of his metabolism.

## 2023-07-09 NOTE — Progress Notes (Signed)
 Office: [phone]  /  Fax: [phone]  Weight Summary And Biometrics  Vitals BP: 102/64 Pulse Rate: 80 SpO2: 96 %   Anthropometric Measurements Height: 5' 11 (1.803 m) Weight: 199 lb (90.3 kg) BMI (Calculated): 27.77 Weight at Last Visit: 196 lb Weight Lost Since Last Visit: 0 Weight Gained Since Last Visit: 3 lb Starting Weight: 210 lb Total Weight Loss (lbs): 11 lb (4.99 kg) Peak Weight: 227 lb   Body Composition  Body Fat %: 23.9 % Fat Mass (lbs): 47.8 lbs Muscle Mass (lbs): 144.2 lbs Total Body Water (lbs): 100.4 lbs Visceral Fat Rating : 8    No data recorded Today's Visit #: 15  Starting Date: 06/13/23   Subjective   Chief Complaint: Obesity  Seth Fields is here to discuss his progress with his obesity treatment plan. He is on the keeping a food journal and adhering to recommended goals of 1200 calories and unknown protein and states he is following his eating plan approximately 90 % of the time. 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  Interval History:   Discussed the use of AI scribe software for clinical note transcription with the patient, who gave verbal consent to proceed.   History of Present Illness   The patient, with a history of insulin  resistance and weight gain associated with psychotropic medication, presents for a medical weight management consultation. He reports adherence to a reduced calorie nutrition plan 90% of the time and engages in physical activities such as running and playing tennis for 45 minutes, four times a week. Despite these efforts, he has gained three pounds since the last office visit. However, bioelectrical impedance analysis (BIA) suggests an increase in muscle mass and further reductions in fat mass, with a visceral fat rating of eight and body fat percentage of 23.9.  The patient's daily diet consists of protein-enriched smoothies for breakfast,  French onion soup for lunch, and a dinner rich in protein, usually meats. He also consumes a snack of golden turmeric cereal with almond milk and dates at night. He reports no significant changes to his waist size.  Regarding medication, the patient is on metformin  with no reported side effects. He was also on Caplyta, a psychotropic medication, but due to insurance issues and high out-of-pocket costs, he is in discussion with his psychiatrist about switching to a different drug.  The patient also engages in strength training with a trainer twice a week. Despite a slight weight gain, the patient's body composition changes suggest an increase in muscle mass and a decrease in body fat percentage.      Orexigenic Control:  Denies problems with appetite and hunger signals.  Denies problems with satiety and satiation.  Denies problems with eating patterns and portion control.  Denies abnormal cravings. Denies feeling deprived or restricted.   Barriers identified: presence of obesogenic drugs.   Pharmacotherapy for weight loss: He is currently taking Metformin  (off label use for incretin effect and / or insulin  resistance and / or diabetes prevention) with adequate clinical response  and without side effects..   Assessment and Plan   Treatment Plan For Obesity:  Recommended Dietary Goals  Seth Fields is currently in the action stage of change. As such, his goal is to continue weight management plan. He has agreed to: continue current plan  Behavioral Intervention  We discussed the following Behavioral Modification Strategies today: continue to work on maintaining a reduced calorie state, getting  the recommended amount of protein, incorporating whole foods, making healthy choices, staying well hydrated and practicing mindfulness when eating..  Additional resources provided today: None  Recommended Physical Activity Goals  Seth Fields has been advised to work up to 150 minutes of moderate intensity  aerobic activity a week and strengthening exercises 2-3 times per week for cardiovascular health, weight loss maintenance and preservation of muscle mass.   He has agreed to :  Think about enjoyable ways to increase daily physical activity and overcoming barriers to exercise and Increase physical activity in their day and reduce sedentary time (increase NEAT).  Pharmacotherapy  We discussed various medication options to help Seth Fields with his weight loss efforts and we both agreed to : continue current anti-obesity medication regimen  Associated Conditions Addressed and Impacted by Obesity Treatment  Abnormal metabolism Assessment & Plan: Patient has a slower than predicted metabolism. IC 1454 vs. calculated 2057.  May be related to medications.  This may contribute to weight gain, chronic fatigue and difficulty losing weight.   Despite this he has managed to lose weight.  He engages in regular physical activity.  We again reviewed the importance of maintaining adequate protein intake and and maintaining a low glycemic load to avoid slowing of his metabolism.    Insulin  resistance Assessment & Plan: His most recent hemoglobin A1c was 5.1 and insulin  levels have decreased by 50% at 11.7 overall markers of improved glycemic control.  He will continue on metformin  for pharmacoprophylaxis also to offset weight gain associated with psychotropic medication.  Orders: -     metFORMIN  HCl ER; Take 1 tablet (500 mg total) by mouth 2 (two) times daily with a meal.  Dispense: 180 tablet; Refill: 0  Weight gain due to medication Assessment & Plan: Patient is on several weight promoting psychotropic medication which are currently being adjusted.  He has managed to lose weight even in the presence of these.  He is also on metformin  to help offset some of the weight gain and metabolic derangements.  He will continue medication no adverse effects reported.   Obesity, Class I with current BMI of  27 Assessment & Plan: So far Seth Fields has lost 27 pounds or 12 % of total body weight.  His BMI is now 27 body fat percentage has gone down. He has been playing tennis and doing strengthening with a certified fitness trainer.  He is on metformin  for incretin effect and also offset weight gain associated with psychotropic medication.  He overall displays improved orixegenic control.  We again emphasized the importance of maintaining adequate hydration, getting 30 to 40 g of protein with each meal and continuing to engage in strengthening exercises.      Objective   Physical Exam:  Blood pressure 102/64, pulse 80, height 5' 11 (1.803 m), weight 199 lb (90.3 kg), SpO2 96%. Body mass index is 27.75 kg/m.  General: He is overweight, cooperative, alert, well developed, and in no acute distress. PSYCH: Has normal mood, affect and thought process.   HEENT: EOMI, sclerae are anicteric. Lungs: Normal breathing effort, no conversational dyspnea. Extremities: No edema.  Neurologic: No gross sensory or motor deficits. No tremors or fasciculations noted.    Diagnostic Data Reviewed:  BMET    Component Value Date/Time   NA 140 04/27/2023 0832   K 4.7 04/27/2023 0832   CL 101 04/27/2023 0832   CO2 22 04/27/2023 0832   GLUCOSE 100 (H) 04/27/2023 0832   GLUCOSE 98 01/27/2015 1715   BUN 13  04/27/2023 0832   CREATININE 0.87 04/27/2023 0832   CREATININE 1.28 08/08/2012 1555   CALCIUM 10.0 04/27/2023 0832   GFRNONAA >60 01/27/2015 1715   GFRAA >60 01/27/2015 1715   Lab Results  Component Value Date   HGBA1C 5.1 04/27/2023   HGBA1C 5.0 06/18/2022   Lab Results  Component Value Date   INSULIN  11.7 04/27/2023   INSULIN  23.4 06/18/2022   Lab Results  Component Value Date   TSH 2.000 06/18/2022   CBC    Component Value Date/Time   WBC 10.2 01/27/2015 1715   RBC 5.58 01/27/2015 1715   HGB 16.6 01/27/2015 1715   HCT 48.0 01/27/2015 1715   PLT 303 01/27/2015 1715   MCV 86.0 01/27/2015  1715   MCV 79.1 (A) 08/08/2012 1611   MCH 29.7 01/27/2015 1715   MCHC 34.6 01/27/2015 1715   RDW 13.8 01/27/2015 1715   Iron Studies No results found for: IRON, TIBC, FERRITIN, IRONPCTSAT Lipid Panel     Component Value Date/Time   CHOL 187 04/27/2023 0832   TRIG 209 (H) 04/27/2023 0832   HDL 39 (L) 04/27/2023 0832   LDLCALC 111 (H) 04/27/2023 0832   Hepatic Function Panel     Component Value Date/Time   PROT 7.5 04/27/2023 0832   ALBUMIN 4.7 04/27/2023 0832   AST 20 04/27/2023 0832   ALT 18 04/27/2023 0832   ALKPHOS 83 04/27/2023 0832   BILITOT 0.5 04/27/2023 0832   BILIDIR <0.1 07/24/2011 2332   IBILI NOT CALCULATED 07/24/2011 2332      Component Value Date/Time   TSH 2.000 06/18/2022 0919   Nutritional Lab Results  Component Value Date   VD25OH 38.6 06/18/2022    Follow-Up   Return in about 6 weeks (around 08/20/2023) for For Weight Mangement with Dr. Francyne.SABRA He was informed of the importance of frequent follow up visits to maximize his success with intensive lifestyle modifications for his multiple health conditions.  Attestation Statement   Reviewed by clinician on day of visit: allergies, medications, problem list, medical history, surgical history, family history, social history, and previous encounter notes.     Lucas Francyne, MD

## 2023-07-09 NOTE — Assessment & Plan Note (Signed)
 Patient is on several weight promoting psychotropic medication which are currently being adjusted.  He has managed to lose weight even in the presence of these.  He is also on metformin  to help offset some of the weight gain and metabolic derangements.  He will continue medication no adverse effects reported.

## 2023-07-09 NOTE — Assessment & Plan Note (Signed)
So far Seth Fields has lost 27 pounds or 12 % of total body weight.  His BMI is now 27 body fat percentage has gone down. He has been playing tennis and doing strengthening with a certified fitness trainer.  He is on metformin for incretin effect and also offset weight gain associated with psychotropic medication.  He overall displays improved orixegenic control.  We again emphasized the importance of maintaining adequate hydration, getting 30 to 40 g of protein with each meal and continuing to engage in strengthening exercises.

## 2023-07-15 ENCOUNTER — Other Ambulatory Visit (HOSPITAL_COMMUNITY): Payer: Self-pay | Admitting: Pharmacy Technician

## 2023-07-15 ENCOUNTER — Other Ambulatory Visit (HOSPITAL_COMMUNITY): Payer: Self-pay

## 2023-07-15 ENCOUNTER — Other Ambulatory Visit: Payer: Self-pay

## 2023-07-15 NOTE — Progress Notes (Signed)
 Specialty Pharmacy Refill Coordination Note  Seth Fields is a 33 y.o. male contacted today regarding refills of specialty medication(s) ARIPiprazole  (Abilify  [name])  Spoke with Mom  Patient requested Delivery   Delivery date: 07/21/23   Verified address: [facility] [address]   Medication will be filled on 07/20/23.

## 2023-07-20 ENCOUNTER — Other Ambulatory Visit (HOSPITAL_COMMUNITY): Payer: Self-pay

## 2023-07-20 DIAGNOSIS — F902 Attention-deficit hyperactivity disorder, combined type: Secondary | ICD-10-CM | POA: Diagnosis not present

## 2023-07-20 DIAGNOSIS — F250 Schizoaffective disorder, bipolar type: Secondary | ICD-10-CM | POA: Diagnosis not present

## 2023-07-21 ENCOUNTER — Other Ambulatory Visit: Payer: Self-pay

## 2023-07-22 DIAGNOSIS — F250 Schizoaffective disorder, bipolar type: Secondary | ICD-10-CM | POA: Diagnosis not present

## 2023-08-03 DIAGNOSIS — F250 Schizoaffective disorder, bipolar type: Secondary | ICD-10-CM | POA: Diagnosis not present

## 2023-08-06 ENCOUNTER — Other Ambulatory Visit: Payer: Self-pay

## 2023-08-10 ENCOUNTER — Other Ambulatory Visit: Payer: Self-pay

## 2023-08-10 ENCOUNTER — Other Ambulatory Visit (HOSPITAL_COMMUNITY): Payer: Self-pay

## 2023-08-10 NOTE — Progress Notes (Signed)
Specialty Pharmacy Refill Coordination Note  Seth Fields is a 33 y.o. male contacted today regarding refills of specialty medication(s) ARIPiprazole (Abilify Maintena)   Patient requested Delivery   Delivery date: 08/14/23   Verified address: [facility]   [address] [place] [address]   Medication will be filled on 08/13/23.

## 2023-08-11 ENCOUNTER — Other Ambulatory Visit: Payer: Self-pay

## 2023-08-11 MED ORDER — ABILIFY MAINTENA 400 MG IM PRSY
PREFILLED_SYRINGE | INTRAMUSCULAR | 0 refills | Status: DC
  Filled 2023-08-11: qty 1, 30d supply, fill #0
  Filled 2023-09-11: qty 1, 28d supply, fill #0

## 2023-08-12 ENCOUNTER — Other Ambulatory Visit: Payer: Self-pay

## 2023-08-12 ENCOUNTER — Other Ambulatory Visit (HOSPITAL_COMMUNITY): Payer: Self-pay

## 2023-08-13 ENCOUNTER — Other Ambulatory Visit: Payer: Self-pay

## 2023-08-19 DIAGNOSIS — F250 Schizoaffective disorder, bipolar type: Secondary | ICD-10-CM | POA: Diagnosis not present

## 2023-08-20 ENCOUNTER — Ambulatory Visit (INDEPENDENT_AMBULATORY_CARE_PROVIDER_SITE_OTHER): Payer: BC Managed Care – PPO | Admitting: Internal Medicine

## 2023-08-20 ENCOUNTER — Encounter (INDEPENDENT_AMBULATORY_CARE_PROVIDER_SITE_OTHER): Payer: Self-pay | Admitting: Internal Medicine

## 2023-08-20 VITALS — BP 108/72 | HR 73 | Temp 98.7°F | Ht 71.0 in | Wt 200.0 lb

## 2023-08-20 DIAGNOSIS — Z6827 Body mass index (BMI) 27.0-27.9, adult: Secondary | ICD-10-CM

## 2023-08-20 DIAGNOSIS — R948 Abnormal results of function studies of other organs and systems: Secondary | ICD-10-CM | POA: Diagnosis not present

## 2023-08-20 DIAGNOSIS — E782 Mixed hyperlipidemia: Secondary | ICD-10-CM | POA: Diagnosis not present

## 2023-08-20 DIAGNOSIS — T50905A Adverse effect of unspecified drugs, medicaments and biological substances, initial encounter: Secondary | ICD-10-CM

## 2023-08-20 DIAGNOSIS — E88819 Insulin resistance, unspecified: Secondary | ICD-10-CM | POA: Diagnosis not present

## 2023-08-20 DIAGNOSIS — E66811 Obesity, class 1: Secondary | ICD-10-CM

## 2023-08-20 DIAGNOSIS — R635 Abnormal weight gain: Secondary | ICD-10-CM

## 2023-08-20 MED ORDER — METFORMIN HCL ER 500 MG PO TB24: 500.0000 mg | ORAL_TABLET | Freq: Two times a day (BID) | ORAL | 0 refills | Status: DC

## 2023-08-20 NOTE — Assessment & Plan Note (Signed)
LDL is not at goal. Elevated LDL may be secondary to nutrition, genetics and spillover effect from excess adiposity. Recommended LDL goal is <70 to reduce the risk of fatty streaks and the progression to obstructive ASCVD in the future.    Lab Results  Component Value Date   CHOL 187 04/27/2023   HDL 39 (L) 04/27/2023   LDLCALC 111 (H) 04/27/2023   TRIG 209 (H) 04/27/2023   I suspect some of his metabolic derangements are secondary to psychotropic medication.  He has made improvements in his dietary intake.  Continue current weight management strategy.  Again reminded on the importance of maintaining a diet with a low glycemic load as this would help with his triglycerides.

## 2023-08-20 NOTE — Assessment & Plan Note (Signed)
Patient has a slower than predicted metabolism. IC 1454 vs. calculated 2057.  May be related to medications.  This may contribute to weight gain, chronic fatigue and difficulty losing weight.   Despite this he has managed to lose weight.  He engages in regular physical activity.  He is also doing a good job with protein intake.  I recommend repeating indirect calorimetry due to slowing of his weight loss.

## 2023-08-20 NOTE — Assessment & Plan Note (Signed)
His most recent hemoglobin A1c was 5.1 and insulin levels have decreased by 50% at 11.7 overall markers of improved glycemic control.  He will continue on metformin for pharmacoprophylaxis also to offset weight gain associated with psychotropic medication.  Continue current weight management strategy

## 2023-08-20 NOTE — Progress Notes (Signed)
Office: [phone]  /  Fax: [phone]  Weight Summary And Biometrics  Vitals Temp: 98.7 F (37.1 C) BP: 108/72 Pulse Rate: 73 SpO2: 95 %   Anthropometric Measurements Height: 5\' 11"  (1.803 m) Weight: 200 lb (90.7 kg) BMI (Calculated): 27.91 Weight at Last Visit: 199 lb Weight Lost Since Last Visit: 0 lb Weight Gained Since Last Visit: 1 lb Starting Weight: 210 lb Total Weight Loss (lbs): 10 lb (4.536 kg) Peak Weight: 227 lb   Body Composition  Body Fat %: 23.5 % Fat Mass (lbs): 47 lbs Muscle Mass (lbs): 145.4 lbs Total Body Water (lbs): 100.8 lbs Visceral Fat Rating : 8    No data recorded Today's Visit #: 16  Starting Date: 06/13/23   Subjective   Chief Complaint: Obesity  Seth Fields is here to discuss his progress with his obesity treatment plan. He is on the keeping a food journal and adhering to recommended goals of 1200 calories and 90 protein and states he is following his eating plan approximately 90 % of the time. He states he is exercising 45 minutes 5 times per week.  Weight Progress Since Last Visit:  Since last office visit he has gained 1 pounds.  BIA information suggest gain and muscle mass and a reduction in body fat percentage.  His weight circumference today is 40-1/2 inches previous at 41 He has been working on not skipping meals, increasing protein intake at every meal, drinking more water, avoiding and / or reducing liquid calories, avoiding or reducing simple and processed carbohydrates, and continues to exercise   Challenges affecting patient progress: presence of obesogenic drugs.   Orexigenic Control: Denies problems with appetite and hunger signals.  Denies problems with satiety and satiation.  Denies problems with eating patterns and portion control.  Denies abnormal cravings. Denies feeling deprived or restricted.   Pharmacotherapy for weight management: He is currently taking Metformin (off label use for incretin effect and /  or insulin resistance and / or diabetes prevention) with adequate clinical response  and without side effects..   Assessment and Plan   Treatment Plan For Obesity:  Recommended Dietary Goals  Seth Fields is currently in the action stage of change. As such, his goal is to continue weight management plan. He has agreed to: continue current plan  Behavioral Health and Counseling  We discussed the following behavioral modification strategies today: continue to work on maintaining a reduced calorie state, getting the recommended amount of protein, incorporating whole foods, making healthy choices, staying well hydrated and practicing mindfulness when eating..  Additional education and resources provided today: None  Recommended Physical Activity Goals  Seth Fields has been advised to work up to 150 minutes of moderate intensity aerobic activity a week and strengthening exercises 2-3 times per week for cardiovascular health, weight loss maintenance and preservation of muscle mass.   He has agreed to :  Think about enjoyable ways to increase daily physical activity and overcoming barriers to exercise and Increase physical activity in their day and reduce sedentary time (increase NEAT).  Pharmacotherapy  We discussed various medication options to help Seth Fields with his weight loss efforts and we both agreed to : adequate clinical response to current dose, continue current regimen  Associated Conditions Impacted by Obesity Treatment  Mixed hyperlipidemia Assessment & Plan: LDL is not at goal. Elevated LDL may be secondary to nutrition, genetics and spillover effect from excess adiposity. Recommended LDL goal is <70 to reduce the risk of fatty streaks and the progression to obstructive  ASCVD in the future.    Lab Results  Component Value Date   CHOL 187 04/27/2023   HDL 39 (L) 04/27/2023   LDLCALC 111 (H) 04/27/2023   TRIG 209 (H) 04/27/2023   I suspect some of his metabolic derangements are  secondary to psychotropic medication.  He has made improvements in his dietary intake.  Continue current weight management strategy.  Again reminded on the importance of maintaining a diet with a low glycemic load as this would help with his triglycerides.       Insulin resistance Assessment & Plan: His most recent hemoglobin A1c was 5.1 and insulin levels have decreased by 50% at 11.7 overall markers of improved glycemic control.  He will continue on metformin for pharmacoprophylaxis also to offset weight gain associated with psychotropic medication.  Continue current weight management strategy  Orders: -     metFORMIN HCl ER; Take 1 tablet (500 mg total) by mouth 2 (two) times daily with a meal.  Dispense: 180 tablet; Refill: 0  Abnormal metabolism Assessment & Plan: Patient has a slower than predicted metabolism. IC 1454 vs. calculated 2057.  May be related to medications.  This may contribute to weight gain, chronic fatigue and difficulty losing weight.   Despite this he has managed to lose weight.  He engages in regular physical activity.  He is also doing a good job with protein intake.  I recommend repeating indirect calorimetry due to slowing of his weight loss.    Weight gain due to medication Assessment & Plan: Patient is on several weight promoting psychotropic medication which she requires and have been helpful from a mental health standpoint.  He has managed to lose weight even in the presence of these.  He is also on metformin to help offset some of the weight gain and metabolic derangements.  He will continue medication no adverse effects reported.  Unfortunately we cannot use topiramate because of drug interactions.   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  His BMI is now 27 body fat percentage has gone down at 24%. He has been playing tennis and doing strengthening with a certified  fitness trainer.  He is on metformin for incretin effect and also offset weight gain associated with psychotropic medication.  He overall displays improved orixegenic control.  He is also been doing a good job with his protein intake.  Since November there has been somewhat of a slowing of his weight loss but he is showing changes in body composition based on bioimpedance information.  I would like to repeat his indirect calorimetry to see if he is experiencing some metabolic slowing due to calorie restriction.  At baseline he had a slower than expected metabolic rate but since then has increased physical activity.  Patient advised to hold Vyvanse the morning of testing as this may influence test results.      Objective   Physical Exam:  Blood pressure 108/72, pulse 73, temperature 98.7 F (37.1 C), height 5\' 11"  (1.803 m), weight 200 lb (90.7 kg), SpO2 95%. Body mass index is 27.89 kg/m.  General: He is overweight, cooperative, alert, well developed, and in no acute distress. PSYCH: Has normal mood, affect and thought process.   HEENT: EOMI, sclerae are anicteric. Lungs: Normal breathing effort, no conversational dyspnea. Extremities: No edema.  Neurologic: No gross sensory or motor deficits. No tremors or fasciculations noted.  Diagnostic Data Reviewed:  BMET    Component Value Date/Time   NA 140 04/27/2023 0832   K 4.7 04/27/2023 0832   CL 101 04/27/2023 0832   CO2 22 04/27/2023 0832   GLUCOSE 100 (H) 04/27/2023 0832   GLUCOSE 98 01/27/2015 1715   BUN 13 04/27/2023 0832   CREATININE 0.87 04/27/2023 0832   CREATININE 1.28 08/08/2012 1555   CALCIUM 10.0 04/27/2023 0832   GFRNONAA >60 01/27/2015 1715   GFRAA >60 01/27/2015 1715   Lab Results  Component Value Date   HGBA1C 5.1 04/27/2023   HGBA1C 5.0 06/18/2022   Lab Results  Component Value Date   INSULIN 11.7 04/27/2023   INSULIN 23.4 06/18/2022   Lab Results  Component Value Date   TSH 2.000 06/18/2022   CBC     Component Value Date/Time   WBC 10.2 01/27/2015 1715   RBC 5.58 01/27/2015 1715   HGB 16.6 01/27/2015 1715   HCT 48.0 01/27/2015 1715   PLT 303 01/27/2015 1715   MCV 86.0 01/27/2015 1715   MCV 79.1 (A) 08/08/2012 1611   MCH 29.7 01/27/2015 1715   MCHC 34.6 01/27/2015 1715   RDW 13.8 01/27/2015 1715   Iron Studies No results found for: "IRON", "TIBC", "FERRITIN", "IRONPCTSAT" Lipid Panel     Component Value Date/Time   CHOL 187 04/27/2023 0832   TRIG 209 (H) 04/27/2023 0832   HDL 39 (L) 04/27/2023 0832   LDLCALC 111 (H) 04/27/2023 0832   Hepatic Function Panel     Component Value Date/Time   PROT 7.5 04/27/2023 0832   ALBUMIN 4.7 04/27/2023 0832   AST 20 04/27/2023 0832   ALT 18 04/27/2023 0832   ALKPHOS 83 04/27/2023 0832   BILITOT 0.5 04/27/2023 0832   BILIDIR <0.1 07/24/2011 2332   IBILI NOT CALCULATED 07/24/2011 2332      Component Value Date/Time   TSH 2.000 06/18/2022 0919   Nutritional Lab Results  Component Value Date   VD25OH 38.6 06/18/2022    Follow-Up   Return in about 4 weeks (around 09/17/2023) for Fasting and 30 minutes early for IC.Marland Kitchen He was informed of the importance of frequent follow up visits to maximize his success with intensive lifestyle modifications for his multiple health conditions.  Attestation Statement   Reviewed by clinician on day of visit: allergies, medications, problem list, medical history, surgical history, family history, social history, and previous encounter notes.     Worthy Rancher, MD

## 2023-08-20 NOTE — Assessment & Plan Note (Signed)
So far Seth Fields has lost 27 pounds or 12 % of total body weight.  His BMI is now 27 body fat percentage has gone down at 24%. He has been playing tennis and doing strengthening with a certified fitness trainer.  He is on metformin for incretin effect and also offset weight gain associated with psychotropic medication.  He overall displays improved orixegenic control.  He is also been doing a good job with his protein intake.  Since November there has been somewhat of a slowing of his weight loss but he is showing changes in body composition based on bioimpedance information.  I would like to repeat his indirect calorimetry to see if he is experiencing some metabolic slowing due to calorie restriction.  At baseline he had a slower than expected metabolic rate but since then has increased physical activity.  Patient advised to hold Vyvanse the morning of testing as this may influence test results.

## 2023-08-20 NOTE — Assessment & Plan Note (Signed)
Patient is on several weight promoting psychotropic medication which she requires and have been helpful from a mental health standpoint.  He has managed to lose weight even in the presence of these.  He is also on metformin to help offset some of the weight gain and metabolic derangements.  He will continue medication no adverse effects reported.  Unfortunately we cannot use topiramate because of drug interactions.

## 2023-08-31 DIAGNOSIS — F250 Schizoaffective disorder, bipolar type: Secondary | ICD-10-CM | POA: Diagnosis not present

## 2023-09-02 DIAGNOSIS — F250 Schizoaffective disorder, bipolar type: Secondary | ICD-10-CM | POA: Diagnosis not present

## 2023-09-02 DIAGNOSIS — F902 Attention-deficit hyperactivity disorder, combined type: Secondary | ICD-10-CM | POA: Diagnosis not present

## 2023-09-02 DIAGNOSIS — F3181 Bipolar II disorder: Secondary | ICD-10-CM | POA: Diagnosis not present

## 2023-09-02 DIAGNOSIS — F251 Schizoaffective disorder, depressive type: Secondary | ICD-10-CM | POA: Diagnosis not present

## 2023-09-02 DIAGNOSIS — Z5181 Encounter for therapeutic drug level monitoring: Secondary | ICD-10-CM | POA: Diagnosis not present

## 2023-09-07 ENCOUNTER — Other Ambulatory Visit: Payer: Self-pay

## 2023-09-07 MED ORDER — ABILIFY MAINTENA 400 MG IM PRSY
PREFILLED_SYRINGE | INTRAMUSCULAR | 0 refills | Status: DC
  Filled 2023-10-08: qty 1, 28d supply, fill #0

## 2023-09-08 ENCOUNTER — Other Ambulatory Visit: Payer: Self-pay

## 2023-09-11 ENCOUNTER — Other Ambulatory Visit: Payer: Self-pay

## 2023-09-11 ENCOUNTER — Other Ambulatory Visit: Payer: Self-pay | Admitting: Pharmacy Technician

## 2023-09-11 NOTE — Progress Notes (Signed)
 Specialty Pharmacy Refill Coordination Note  Seth Fields is a 33 y.o. male contacted today regarding refills of specialty medication(s) ARIPiprazole (Abilify Maintena)  Spoke with Mom  Patient requested Delivery   Delivery date: 09/15/23   Verified address: [facility] [address]   Medication will be filled on 09/14/23.

## 2023-09-14 ENCOUNTER — Other Ambulatory Visit: Payer: Self-pay

## 2023-09-14 DIAGNOSIS — F250 Schizoaffective disorder, bipolar type: Secondary | ICD-10-CM | POA: Diagnosis not present

## 2023-09-16 DIAGNOSIS — F250 Schizoaffective disorder, bipolar type: Secondary | ICD-10-CM | POA: Diagnosis not present

## 2023-09-29 ENCOUNTER — Ambulatory Visit (INDEPENDENT_AMBULATORY_CARE_PROVIDER_SITE_OTHER): Payer: BC Managed Care – PPO | Admitting: Internal Medicine

## 2023-09-29 DIAGNOSIS — F250 Schizoaffective disorder, bipolar type: Secondary | ICD-10-CM | POA: Diagnosis not present

## 2023-10-06 ENCOUNTER — Other Ambulatory Visit: Payer: Self-pay

## 2023-10-08 ENCOUNTER — Other Ambulatory Visit: Payer: Self-pay | Admitting: Pharmacy Technician

## 2023-10-08 ENCOUNTER — Other Ambulatory Visit (HOSPITAL_COMMUNITY): Payer: Self-pay

## 2023-10-08 ENCOUNTER — Other Ambulatory Visit: Payer: Self-pay

## 2023-10-08 NOTE — Progress Notes (Signed)
 Specialty Pharmacy Refill Coordination Note  Seth Fields is a 32 y.o. male contacted today regarding refills of specialty medication(s) ARIPiprazole (Abilify Maintena)   Patient requested (Patient-Rptd) Delivery   Delivery date: (Patient-Rptd) 10/12/23   Verified address: (Patient-Rptd) [address], [place], [place] [address]   Medication will be filled on 10/09/23.

## 2023-10-09 ENCOUNTER — Other Ambulatory Visit: Payer: Self-pay

## 2023-10-12 ENCOUNTER — Other Ambulatory Visit: Payer: Self-pay

## 2023-10-12 DIAGNOSIS — F250 Schizoaffective disorder, bipolar type: Secondary | ICD-10-CM | POA: Diagnosis not present

## 2023-10-12 MED ORDER — ABILIFY MAINTENA 400 MG IM PRSY
PREFILLED_SYRINGE | INTRAMUSCULAR | 0 refills | Status: DC
  Filled 2023-12-02: qty 1, 28d supply, fill #0

## 2023-10-14 DIAGNOSIS — F250 Schizoaffective disorder, bipolar type: Secondary | ICD-10-CM | POA: Diagnosis not present

## 2023-10-27 DIAGNOSIS — F250 Schizoaffective disorder, bipolar type: Secondary | ICD-10-CM | POA: Diagnosis not present

## 2023-10-28 ENCOUNTER — Other Ambulatory Visit: Payer: Self-pay

## 2023-10-28 DIAGNOSIS — F1210 Cannabis abuse, uncomplicated: Secondary | ICD-10-CM | POA: Diagnosis not present

## 2023-10-28 DIAGNOSIS — F250 Schizoaffective disorder, bipolar type: Secondary | ICD-10-CM | POA: Diagnosis not present

## 2023-10-28 MED ORDER — ABILIFY MAINTENA 400 MG IM PRSY
PREFILLED_SYRINGE | INTRAMUSCULAR | 0 refills | Status: DC
  Filled 2023-11-04: qty 1, 30d supply, fill #0

## 2023-11-02 ENCOUNTER — Ambulatory Visit (INDEPENDENT_AMBULATORY_CARE_PROVIDER_SITE_OTHER): Admitting: Internal Medicine

## 2023-11-02 ENCOUNTER — Encounter (INDEPENDENT_AMBULATORY_CARE_PROVIDER_SITE_OTHER): Payer: Self-pay | Admitting: Internal Medicine

## 2023-11-02 ENCOUNTER — Other Ambulatory Visit: Payer: Self-pay

## 2023-11-02 VITALS — BP 106/69 | HR 60 | Temp 97.6°F | Ht 71.0 in | Wt 191.0 lb

## 2023-11-02 DIAGNOSIS — R948 Abnormal results of function studies of other organs and systems: Secondary | ICD-10-CM

## 2023-11-02 DIAGNOSIS — R635 Abnormal weight gain: Secondary | ICD-10-CM

## 2023-11-02 DIAGNOSIS — Z6827 Body mass index (BMI) 27.0-27.9, adult: Secondary | ICD-10-CM

## 2023-11-02 DIAGNOSIS — T4395XA Adverse effect of unspecified psychotropic drug, initial encounter: Secondary | ICD-10-CM

## 2023-11-02 DIAGNOSIS — E88819 Insulin resistance, unspecified: Secondary | ICD-10-CM | POA: Diagnosis not present

## 2023-11-02 DIAGNOSIS — E66811 Obesity, class 1: Secondary | ICD-10-CM

## 2023-11-02 DIAGNOSIS — E7800 Pure hypercholesterolemia, unspecified: Secondary | ICD-10-CM

## 2023-11-02 DIAGNOSIS — Z5181 Encounter for therapeutic drug level monitoring: Secondary | ICD-10-CM | POA: Diagnosis not present

## 2023-11-02 MED ORDER — METFORMIN HCL ER 500 MG PO TB24: 500.0000 mg | ORAL_TABLET | Freq: Two times a day (BID) | ORAL | 0 refills | Status: DC

## 2023-11-02 NOTE — Assessment & Plan Note (Signed)
 LDL is not at goal. Elevated LDL may be secondary to nutrition, genetics and spillover effect from excess adiposity. Recommended LDL goal is <70 to reduce the risk of fatty streaks and the progression to obstructive ASCVD in the future.    Lab Results  Component Value Date   CHOL 187 04/27/2023   HDL 39 (L) 04/27/2023   LDLCALC 111 (H) 04/27/2023   TRIG 209 (H) 04/27/2023   I suspect some of his metabolic derangements are secondary to psychotropic medication.  He has made improvements in his dietary intake.  Continue current weight management strategy.  Check fasting lipid profile today.

## 2023-11-02 NOTE — Assessment & Plan Note (Signed)
 His most recent hemoglobin A1c was 5.1 and insulin  levels have decreased by 50% at 11.7 overall markers of improved glycemic control.  He will continue on metformin  for pharmacoprophylaxis also to offset weight gain associated with psychotropic medication.  We are checking disease monitoring labs today please refer to orders.  Continue metformin  for pharmacoprophylaxis.

## 2023-11-02 NOTE — Progress Notes (Signed)
 Office: [phone]  /  Fax: [phone]  Weight Summary And Biometrics  Vitals Temp: 97.6 F (36.4 C) BP: 106/69 Pulse Rate: 60 SpO2: 95 %   Anthropometric Measurements Height: 5\' 11"  (1.803 m) Weight: 191 lb (86.6 kg) BMI (Calculated): 26.65 Weight at Last Visit: 200 lb Weight Lost Since Last Visit: 9 lb Weight Gained Since Last Visit: 0 Starting Weight: 210 lb Total Weight Loss (lbs): 19 lb (8.618 kg) Peak Weight: 227 lb   Body Composition  Body Fat %: 23.5 % Fat Mass (lbs): 45 lbs Muscle Mass (lbs): 139 lbs Total Body Water (lbs): 97.4 lbs Visceral Fat Rating : 8    RMR: 2045  Today's Visit #: 17  Starting Date: 06/17/22   Subjective   Chief Complaint: Obesity  Interval History Discussed the use of AI scribe software for clinical note transcription with the patient, who gave verbal consent to proceed.  History of Present Illness   Seth Fields is a 33 year old male with weight gain associated with medications and prediabetes who presents for weight management.  He has lost nine pounds since his last office visit, attributing this to adherence to a 1200 calorie nutrition plan approximately 90% of the time, despite not tracking calories. His diet includes more whole foods, adequate protein intake, and proper hydration.  He exercises four days a week for about 60 minutes, incorporating strength training, cardio, and yoga. 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  He reports adequate sleep and no high levels of stress. He is currently on metformin  for insulin  resistance and to offset weight gain associated with psychotropic medication and experiences no significant side effects.  He mentions a past instance of blood in his stools but states that his inflammatory bowel disease is stable.  His metabolic rate has improved significantly from a  baseline of 1454 to 2045. He has been consistent with his weight loss efforts, reducing his body fat percentage from 27% to 23% and visceral fat from 10 to 8. He has maintained weight loss since July 2023, when he weighed 225 pounds.       Challenges affecting patient progress: presence of obesogenic drugs.    Pharmacotherapy for weight management: He is currently taking Metformin  (off label use for incretin effect and / or insulin  resistance and / or diabetes prevention) with adequate clinical response  and without side effects..   Assessment and Plan   Treatment Plan For Obesity:  Recommended Dietary Goals  Seth Fields is currently in the action stage of change. As such, his goal is to continue weight management plan. He has agreed to: follow the Category 2 plan - 1200 kcal per day  Behavioral Health and Counseling  We discussed the following behavioral modification strategies today: continue to work on maintaining a reduced calorie state, getting the recommended amount of protein, incorporating whole foods, making healthy choices, staying well hydrated and practicing mindfulness when eating..  Additional education and resources provided today: None  Recommended Physical Activity Goals  Seth Fields has been advised to work up to 150 minutes of moderate intensity aerobic activity a week and strengthening exercises 2-3 times per week for cardiovascular health, weight loss maintenance and preservation of muscle mass.   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  He is on several weight promoting medications including immunosuppressants and psychotropic drugs for which metformin  helps offset some of the weight gain.  He also has insulin  resistance.  Seth Fields has made substantial changes in his lifestyle he is also very active engaging in tennis as well as to weight training with fitness instructor.  He will continue with current weight management strategy.  His father had questions about semaglutide these were addressed during this office visit.  Orders: -     metFORMIN  HCl ER; Take 1 tablet (500 mg total) by mouth 2 (two) times daily with a meal.  Dispense: 180 tablet; Refill: 0 -     CMP14+EGFR -     Hemoglobin A1c -     Insulin , random -     Lipid Panel With LDL/HDL Ratio -     Vitamin B12 -     CBC with Differential/Platelet -     VITAMIN D  25 Hydroxy (Vit-D Deficiency, Fractures)  Insulin  resistance Assessment & Plan: His most recent hemoglobin A1c was 5.1 and insulin  levels have decreased by 50% at 11.7 overall markers of improved glycemic control.  He will continue on metformin  for pharmacoprophylaxis also to offset weight gain associated with psychotropic medication.  We are checking disease monitoring labs today please refer to orders.  Continue metformin  for pharmacoprophylaxis.  Orders: -     metFORMIN  HCl ER; Take 1 tablet (500 mg total) by mouth 2 (two) times daily with a meal.  Dispense: 180 tablet; Refill: 0 -     CMP14+EGFR -     Hemoglobin A1c -     Insulin , random -     Lipid Panel With LDL/HDL Ratio -     Vitamin B12 -     CBC with Differential/Platelet  Weight gain due to medication Assessment & Plan: Patient is on several weight promoting psychotropic medication which she requires and have been helpful from a mental health standpoint.  He has managed to lose weight even in the presence of these.  He is also on metformin  to help offset  some of the weight gain and metabolic derangements.  He will continue medication no adverse effects reported.    Abnormal metabolism Assessment & Plan: Indirect calorimetry was completed today following 1 year his resting energy expenditure is now 2045 which has improved from his baseline of 1400.  He has adequate satiety with current target of 1200 cal therefore we will continue.  If we notice loss of muscle and increase sense of hunger then we may have to increase his calories based on levels of physical activity..  Continue current weight management strategy   Pure hypercholesterolemia Assessment & Plan: LDL is not at goal. Elevated LDL may be secondary to nutrition, genetics and spillover effect from excess adiposity. Recommended LDL goal is <70 to reduce the risk of fatty streaks and the progression to obstructive ASCVD in the future.    Lab Results  Component Value Date   CHOL 187 04/27/2023   HDL 39 (L) 04/27/2023   LDLCALC 111 (H) 04/27/2023   TRIG 209 (H) 04/27/2023   I suspect some of his  metabolic derangements are secondary to psychotropic medication.  He has made improvements in his dietary intake.  Continue current weight management strategy.  Check fasting lipid profile today.      Orders: -     Lipid Panel With LDL/HDL Ratio  Therapeutic drug monitoring -     CBC with Differential/Platelet       Objective   Physical Exam:  Blood pressure 106/69, pulse 60, temperature 97.6 F (36.4 C), height 5\' 11"  (1.803 m), weight 191 lb (86.6 kg), SpO2 95%. Body mass index is 26.64 kg/m.  General: He is overweight, cooperative, alert, well developed, and in no acute distress. PSYCH: Has normal mood, affect and thought process.   HEENT: EOMI, sclerae are anicteric. Lungs: Normal breathing effort, no conversational dyspnea. Extremities: No edema.  Neurologic: No gross sensory or motor deficits. No tremors or fasciculations noted.    Diagnostic Data  Reviewed:  BMET    Component Value Date/Time   NA 140 04/27/2023 0832   K 4.7 04/27/2023 0832   CL 101 04/27/2023 0832   CO2 22 04/27/2023 0832   GLUCOSE 100 (H) 04/27/2023 0832   GLUCOSE 98 01/27/2015 1715   BUN 13 04/27/2023 0832   CREATININE 0.87 04/27/2023 0832   CREATININE 1.28 08/08/2012 1555   CALCIUM 10.0 04/27/2023 0832   GFRNONAA >60 01/27/2015 1715   GFRAA >60 01/27/2015 1715   Lab Results  Component Value Date   HGBA1C 5.1 04/27/2023   HGBA1C 5.0 06/18/2022   Lab Results  Component Value Date   INSULIN  11.7 04/27/2023   INSULIN  23.4 06/18/2022   Lab Results  Component Value Date   TSH 2.000 06/18/2022   CBC    Component Value Date/Time   WBC 10.2 01/27/2015 1715   RBC 5.58 01/27/2015 1715   HGB 16.6 01/27/2015 1715   HCT 48.0 01/27/2015 1715   PLT 303 01/27/2015 1715   MCV 86.0 01/27/2015 1715   MCV 79.1 (A) 08/08/2012 1611   MCH 29.7 01/27/2015 1715   MCHC 34.6 01/27/2015 1715   RDW 13.8 01/27/2015 1715   Iron Studies No results found for: "IRON", "TIBC", "FERRITIN", "IRONPCTSAT" Lipid Panel     Component Value Date/Time   CHOL 187 04/27/2023 0832   TRIG 209 (H) 04/27/2023 0832   HDL 39 (L) 04/27/2023 0832   LDLCALC 111 (H) 04/27/2023 0832   Hepatic Function Panel     Component Value Date/Time   PROT 7.5 04/27/2023 0832   ALBUMIN 4.7 04/27/2023 0832   AST 20 04/27/2023 0832   ALT 18 04/27/2023 0832   ALKPHOS 83 04/27/2023 0832   BILITOT 0.5 04/27/2023 0832   BILIDIR <0.1 07/24/2011 2332   IBILI NOT CALCULATED 07/24/2011 2332      Component Value Date/Time   TSH 2.000 06/18/2022 0919   Nutritional Lab Results  Component Value Date   VD25OH 38.6 06/18/2022    Medications: Outpatient Encounter Medications as of 11/02/2023  Medication Sig Note   Adalimumab 40 MG/0.8ML PSKT Inject 40 mg into the skin every 14 (fourteen) days. 02/05/2015: Next injection due on Thursday 02/08/15.    albuterol  (VENTOLIN  HFA) 108 (90 Base) MCG/ACT  inhaler Inhale 2 puffs into the lungs every 6 (six) hours as needed for wheezing.    amphetamine-dextroamphetamine (ADDERALL) 20 MG tablet Take 1 tablet by mouth daily in the afternoon.    ARIPiprazole  (ABILIFY ) 10 MG tablet Take 10 mg by mouth at bedtime as needed.    ARIPiprazole  ER (ABILIFY  MAINTENA) 400 MG PRSY prefilled  syringe Inject 400 mg into the muscle every 30 (thirty) days.    ARIPiprazole  ER (ABILIFY  MAINTENA) 400 MG PRSY prefilled syringe Inject 400 mg into the muscle every 30 (thirty) days.    ARIPiprazole  ER (ABILIFY  MAINTENA) 400 MG PRSY prefilled syringe Inject once monthly.    ARIPiprazole  ER (ABILIFY  MAINTENA) 400 MG PRSY prefilled syringe Inject once monthly    ARIPiprazole  ER (ABILIFY  MAINTENA) 400 MG PRSY prefilled syringe Inject 400 mg into the muscle every month    ARIPiprazole  ER (ABILIFY  MAINTENA) 400 MG PRSY prefilled syringe q monthly    ARIPiprazole  ER (ABILIFY  MAINTENA) 400 MG PRSY prefilled syringe Inject into the muscle every 30 (thirty) days.    ARIPiprazole  ER (ABILIFY  MAINTENA) 400 MG PRSY prefilled syringe Inject subcutaneously once monthly.    ascorbic acid (VITAMIN C) 500 MG tablet Take 1 tablet by mouth daily.    azaTHIOprine  (IMURAN ) 50 MG tablet Take 100 mg by mouth daily. 02/05/2015: Pharmacy verified medication.    B Complex-Biotin-FA (B-50 COMPLEX PO) Take 1 capsule by mouth daily.    benztropine (COGENTIN) 0.5 MG tablet Take 0.5 mg by mouth daily.    CAPLYTA 21 MG CAPS Take 1 capsule by mouth every morning.    cholecalciferol  (VITAMIN D ) 1000 UNITS tablet Take 5,000 Units by mouth daily.    clonazePAM  (KLONOPIN ) 1 MG tablet Take 1 mg by mouth 2 (two) times daily as needed for anxiety. 02/05/2015: Pt mother verified medication.   DULoxetine (CYMBALTA) 60 MG capsule Take 60 mg by mouth 2 (two) times daily.    folic acid  (FOLVITE ) 1 MG tablet Take 1 mg by mouth daily. 02/05/2015: Pharmacy verified medication.   lamoTRIgine  (LAMICTAL  XR) 50 MG 24 hour tablet  Take 1 tablet by mouth at bedtime. 02/05/2015: Pharmacy verified medication.   lisdexamfetamine (VYVANSE) 70 MG capsule Take 1 capsule by mouth in the morning.    Misc Natural Products (Seth Fields CBD+13 SL) Take 1 capsule by mouth daily. 10/08/2020: CBD OIL CAPSULE   Multiple Vitamins-Minerals (MULTIVITAMINS THER. W/MINERALS) TABS Take 1 tablet by mouth daily.    naltrexone (DEPADE) 50 MG tablet Take 50 mg by mouth daily.    Niacin, Antihyperlipidemic, 500 MG TABS Take by mouth.    Omega-3 Fatty Acids (FISH OIL) 1000 MG CAPS Take 1 capsule by mouth daily.    SUMAtriptan  (IMITREX ) 50 MG tablet Take 1 tablet (50 mg total) by mouth as needed for migraine.    XIFAXAN 200 MG tablet Take 200 mg by mouth 2 (two) times daily.    [DISCONTINUED] metFORMIN  (GLUCOPHAGE -XR) 500 MG 24 hr tablet Take 1 tablet (500 mg total) by mouth 2 (two) times daily with a meal.    metFORMIN  (GLUCOPHAGE -XR) 500 MG 24 hr tablet Take 1 tablet (500 mg total) by mouth 2 (two) times daily with a meal.    No facility-administered encounter medications on file as of 11/02/2023.     Follow-Up   Return in about 2 months (around 01/02/2024) for For Weight Mangement with Dr. Allie Area.Aaron Aas He was informed of the importance of frequent follow up visits to maximize his success with intensive lifestyle modifications for his multiple health conditions.  Attestation Statement   Reviewed by clinician on day of visit: allergies, medications, problem list, medical history, surgical history, family history, social history, and previous encounter notes.     Ladd Picker, MD

## 2023-11-02 NOTE — Assessment & Plan Note (Signed)
 Indirect calorimetry was completed today following 1 year his resting energy expenditure is now 2045 which has improved from his baseline of 1400.  He has adequate satiety with current target of 1200 cal therefore we will continue.  If we notice loss of muscle and increase sense of hunger then we may have to increase his calories based on levels of physical activity..  Continue current weight management strategy

## 2023-11-02 NOTE — Assessment & Plan Note (Signed)
 Patient is on several weight promoting psychotropic medication which she requires and have been helpful from a mental health standpoint.  He has managed to lose weight even in the presence of these.  He is also on metformin  to help offset some of the weight gain and metabolic derangements.  He will continue medication no adverse effects reported.

## 2023-11-02 NOTE — Assessment & Plan Note (Signed)
 From a peak weight of 225 in 2023 Azael has lost approximately 34 pounds which represents 16% of total body weight on medically supervised weight management plan inclusive of metformin .  He is on several weight promoting medications including immunosuppressants and psychotropic drugs for which metformin  helps offset some of the weight gain.  He also has insulin  resistance.  Ishmeal has made substantial changes in his lifestyle he is also very active engaging in tennis as well as to weight training with fitness instructor.  He will continue with current weight management strategy.  His father had questions about semaglutide these were addressed during this office visit.

## 2023-11-03 LAB — CBC WITH DIFFERENTIAL/PLATELET
Basophils Absolute: 0 10*3/uL (ref 0.0–0.2)
Basos: 0 %
EOS (ABSOLUTE): 0.5 10*3/uL — ABNORMAL HIGH (ref 0.0–0.4)
Eos: 5 %
Hematocrit: 47 % (ref 37.5–51.0)
Hemoglobin: 15.8 g/dL (ref 13.0–17.7)
Immature Grans (Abs): 0 10*3/uL (ref 0.0–0.1)
Immature Granulocytes: 0 %
Lymphocytes Absolute: 2.8 10*3/uL (ref 0.7–3.1)
Lymphs: 27 %
MCH: 29.3 pg (ref 26.6–33.0)
MCHC: 33.6 g/dL (ref 31.5–35.7)
MCV: 87 fL (ref 79–97)
Monocytes Absolute: 0.5 10*3/uL (ref 0.1–0.9)
Monocytes: 5 %
Neutrophils Absolute: 6.6 10*3/uL (ref 1.4–7.0)
Neutrophils: 63 %
Platelets: 371 10*3/uL (ref 150–450)
RBC: 5.4 x10E6/uL (ref 4.14–5.80)
RDW: 13.7 % (ref 11.6–15.4)
WBC: 10.4 10*3/uL (ref 3.4–10.8)

## 2023-11-03 LAB — CMP14+EGFR
ALT: 19 IU/L (ref 0–44)
AST: 21 IU/L (ref 0–40)
Albumin: 4.6 g/dL (ref 4.1–5.1)
Alkaline Phosphatase: 87 IU/L (ref 44–121)
BUN/Creatinine Ratio: 16 (ref 9–20)
BUN: 17 mg/dL (ref 6–20)
Bilirubin Total: 0.5 mg/dL (ref 0.0–1.2)
CO2: 23 mmol/L (ref 20–29)
Calcium: 9.7 mg/dL (ref 8.7–10.2)
Chloride: 101 mmol/L (ref 96–106)
Creatinine, Ser: 1.06 mg/dL (ref 0.76–1.27)
Globulin, Total: 2.5 g/dL (ref 1.5–4.5)
Glucose: 100 mg/dL — ABNORMAL HIGH (ref 70–99)
Potassium: 4.3 mmol/L (ref 3.5–5.2)
Sodium: 139 mmol/L (ref 134–144)
Total Protein: 7.1 g/dL (ref 6.0–8.5)
eGFR: 94 mL/min/{1.73_m2} (ref >59)

## 2023-11-03 LAB — LIPID PANEL WITH LDL/HDL RATIO
Cholesterol, Total: 207 mg/dL — ABNORMAL HIGH (ref 100–199)
HDL: 40 mg/dL (ref >39)
LDL Chol Calc (NIH): 127 mg/dL — ABNORMAL HIGH (ref 0–99)
LDL/HDL Ratio: 3.2 ratio (ref 0.0–3.6)
Triglycerides: 224 mg/dL — ABNORMAL HIGH (ref 0–149)
VLDL Cholesterol Cal: 40 mg/dL (ref 5–40)

## 2023-11-03 LAB — VITAMIN B12: Vitamin B-12: 1330 pg/mL — ABNORMAL HIGH (ref 232–1245)

## 2023-11-03 LAB — VITAMIN D 25 HYDROXY (VIT D DEFICIENCY, FRACTURES): Vit D, 25-Hydroxy: 75.9 ng/mL (ref 30.0–100.0)

## 2023-11-03 LAB — INSULIN, RANDOM: INSULIN: 9.2 u[IU]/mL (ref 2.6–24.9)

## 2023-11-03 LAB — HEMOGLOBIN A1C
Est. average glucose Bld gHb Est-mCnc: 97 mg/dL
Hgb A1c MFr Bld: 5 % (ref 4.8–5.6)

## 2023-11-04 ENCOUNTER — Encounter (INDEPENDENT_AMBULATORY_CARE_PROVIDER_SITE_OTHER): Payer: Self-pay | Admitting: Internal Medicine

## 2023-11-04 ENCOUNTER — Other Ambulatory Visit: Payer: Self-pay

## 2023-11-04 ENCOUNTER — Other Ambulatory Visit (HOSPITAL_COMMUNITY): Payer: Self-pay | Admitting: Pharmacy Technician

## 2023-11-04 ENCOUNTER — Other Ambulatory Visit (HOSPITAL_COMMUNITY): Payer: Self-pay

## 2023-11-04 NOTE — Progress Notes (Signed)
 Specialty Pharmacy Refill Coordination Note  Seth Fields is a 33 y.o. male contacted today regarding refills of specialty medication(s) ARIPiprazole  (Abilify  [name])   Patient requested Courier to Provider Office   Delivery date: 11/05/23   Verified address: [facility] [address], [place], [place] [address]   Medication will be filled on 11/05/23.

## 2023-11-05 ENCOUNTER — Other Ambulatory Visit (HOSPITAL_COMMUNITY): Payer: Self-pay

## 2023-11-05 ENCOUNTER — Other Ambulatory Visit: Payer: Self-pay

## 2023-11-09 DIAGNOSIS — F1210 Cannabis abuse, uncomplicated: Secondary | ICD-10-CM | POA: Diagnosis not present

## 2023-11-09 DIAGNOSIS — F250 Schizoaffective disorder, bipolar type: Secondary | ICD-10-CM | POA: Diagnosis not present

## 2023-11-11 ENCOUNTER — Other Ambulatory Visit: Payer: Self-pay

## 2023-11-11 DIAGNOSIS — F250 Schizoaffective disorder, bipolar type: Secondary | ICD-10-CM | POA: Diagnosis not present

## 2023-11-11 MED ORDER — ABILIFY MAINTENA 400 MG IM PRSY
PREFILLED_SYRINGE | INTRAMUSCULAR | 4 refills | Status: AC
Start: 2023-11-11 — End: ?
  Filled 2023-12-28: qty 1, 28d supply, fill #0

## 2023-11-23 DIAGNOSIS — F250 Schizoaffective disorder, bipolar type: Secondary | ICD-10-CM | POA: Diagnosis not present

## 2023-12-01 ENCOUNTER — Other Ambulatory Visit: Payer: Self-pay

## 2023-12-02 ENCOUNTER — Other Ambulatory Visit: Payer: Self-pay

## 2023-12-02 NOTE — Progress Notes (Signed)
 Specialty Pharmacy Refill Coordination Note  Seth Fields is a 33 y.o. male contacted today regarding refills of specialty medication(s) ARIPiprazole  (Abilify  Maintena)   Patient requested (Patient-Rptd) Delivery   Delivery date: (Patient-Rptd) 12/07/23   Verified address: (Patient-Rptd) [facility], [address], [place], [place] [address]   Medication will be filled on 12/04/23.

## 2023-12-07 ENCOUNTER — Telehealth (HOSPITAL_BASED_OUTPATIENT_CLINIC_OR_DEPARTMENT_OTHER): Payer: Self-pay | Admitting: *Deleted

## 2023-12-07 DIAGNOSIS — F250 Schizoaffective disorder, bipolar type: Secondary | ICD-10-CM | POA: Diagnosis not present

## 2023-12-07 NOTE — Telephone Encounter (Signed)
**Note De-identified  [place] Obfuscation** Please advise 

## 2023-12-07 NOTE — Telephone Encounter (Signed)
 Copied from CRM [id number]. Topic: General - Other >> Dec 07, 2023  9:25 AM Deaijah H wrote: Reason for CRM: Patients mom would like to know if patient would need additional labs done with Dr. De [place] due to him have blood drawn with previous healthy weight and wellness doctor previously. Please call [phone] to confirm

## 2023-12-08 ENCOUNTER — Telehealth (HOSPITAL_BASED_OUTPATIENT_CLINIC_OR_DEPARTMENT_OTHER): Payer: Self-pay | Admitting: *Deleted

## 2023-12-08 NOTE — Telephone Encounter (Signed)
 LVM for patient mom Rexene Catching to call the office

## 2023-12-08 NOTE — Telephone Encounter (Signed)
 Spoke with pt mother Rexene Catching. Per provider patient does not need labs drawn at this time. Lab appt cancelled. Patient will come for physical appt

## 2023-12-08 NOTE — Telephone Encounter (Signed)
 Please see other phone message

## 2023-12-08 NOTE — Telephone Encounter (Signed)
 Copied from CRM [id number]. Topic: Clinical - Lab/Test Results >> Dec 08, 2023 10:57 AM Phil Braun wrote: Reason for CRM:   Rexene Catching called and is returning [place] call. She is wanting to know information about labs. Please return call.

## 2023-12-09 DIAGNOSIS — F250 Schizoaffective disorder, bipolar type: Secondary | ICD-10-CM | POA: Diagnosis not present

## 2023-12-15 ENCOUNTER — Other Ambulatory Visit (HOSPITAL_BASED_OUTPATIENT_CLINIC_OR_DEPARTMENT_OTHER): Payer: BC Managed Care – PPO

## 2023-12-21 DIAGNOSIS — F250 Schizoaffective disorder, bipolar type: Secondary | ICD-10-CM | POA: Diagnosis not present

## 2023-12-22 ENCOUNTER — Encounter (HOSPITAL_BASED_OUTPATIENT_CLINIC_OR_DEPARTMENT_OTHER): Payer: Self-pay | Admitting: Family Medicine

## 2023-12-22 ENCOUNTER — Ambulatory Visit (INDEPENDENT_AMBULATORY_CARE_PROVIDER_SITE_OTHER): Payer: BC Managed Care – PPO | Admitting: Family Medicine

## 2023-12-22 VITALS — BP 118/78 | HR 88 | Ht 72.0 in | Wt 190.8 lb

## 2023-12-22 DIAGNOSIS — Z0000 Encounter for general adult medical examination without abnormal findings: Secondary | ICD-10-CM

## 2023-12-22 NOTE — Patient Instructions (Signed)

## 2023-12-22 NOTE — Assessment & Plan Note (Signed)
 Routine HCM labs reviewed - labs with HWW, overall reassuring. HCM reviewed/discussed. Anticipatory guidance regarding healthy weight, lifestyle and choices given. Recommend healthy diet.  Recommend approximately 150 minutes/week of moderate intensity exercise Recommend regular dental and vision exams Always use seatbelt/lap and shoulder restraints Recommend using smoke alarms and checking batteries at least twice a year Recommend using sunscreen when outside Discussed immunization recommendations

## 2023-12-22 NOTE — Progress Notes (Signed)
 Subjective:    CC: Annual Physical Exam  HPI: Seth Fields is a 33 y.o. presenting for annual physical  I reviewed the past medical history, family history, social history, surgical history, and allergies today and no changes were needed.  Please see the problem list section below in epic for further details.  Past Medical History: Past Medical History:  Diagnosis Date   ADD (attention deficit disorder)    Alcohol abuse    Allergy    Anemia    Anxiety    Asthma    Pt reports this is resolved   Bipolar affective (HCC)    Crohn's disease (HCC)    Depression    Drug use    Gluten intolerance    High cholesterol    Schizo affective schizophrenia (HCC)    Stomach ulcer    Substance abuse (HCC)    Past Surgical History: Past Surgical History:  Procedure Laterality Date   BOWEL RESECTION     SMALL INTESTINE SURGERY     Social History: Social History   Socioeconomic History   Marital status: Single    Spouse name: n/a   Number of children: 0   Years of education: Not on file   Highest education level: Not on file  Occupational History   Occupation: Consulting civil engineer    Comment: English and Occupational hygienist at [employer]  Tobacco Use   Smoking status: Some Days    Current packs/day: 0.00    Types: Cigarettes    Passive exposure: Current   Smokeless tobacco: Former   Tobacco comments:    Smokes 1 cig a day  Vaping Use   Vaping status: Every Day  Substance and Sexual Activity   Alcohol use: Yes    Comment: every now and then, 1-2 beers every other week   Drug use: No    Comment: Patient reports he used molly and cocaine.    Sexual activity: Not on file  Other Topics Concern   Not on file  Social History Narrative   Lives alone, with caregiver support.   His family lives in [place], [place].   Social Drivers of Corporate investment banker Strain: Low Risk  (12/22/2023)   Overall Financial Resource Strain (CARDIA)    Difficulty of Paying Living Expenses:  Not hard at all  Food Insecurity: No Food Insecurity (12/22/2023)   Hunger Vital Sign    Worried About Running Out of Food in the Last Year: Never true    Ran Out of Food in the Last Year: Never true  Transportation Needs: No Transportation Needs (12/22/2023)   PRAPARE - Administrator, Civil Service (Medical): No    Lack of Transportation (Non-Medical): No  Physical Activity: Insufficiently Active (12/22/2023)   Exercise Vital Sign    Days of Exercise per Week: 4 days    Minutes of Exercise per Session: 30 min  Stress: No Stress Concern Present (12/22/2023)   [employer] of Occupational Health - Occupational Stress Questionnaire    Feeling of Stress: Not at all  Social Connections: Moderately Integrated (12/22/2023)   Social Connection and Isolation Panel    Frequency of Communication with Friends and Family: More than three times a week    Frequency of Social Gatherings with Friends and Family: More than three times a week    Attends Religious Services: More than 4 times per year    Active Member of [employer] or Organizations: Yes    Attends Banker Meetings: Never  Marital Status: Never married   Family History: Family History  Problem Relation Age of Onset   High Cholesterol Mother    Thyroid  disease Mother    Cancer Mother    Asthma Mother    Miscarriages / [place] Mother    High blood pressure Father    High Cholesterol Father    Cancer Father    Sleep apnea Father    Obesity Father    ADD / ADHD Father    Hyperlipidemia Father    Anxiety disorder Brother    Asthma Brother    Intellectual disability Maternal Aunt    Allergies: Allergies  Allergen Reactions   Cefaclor Dermatitis, Hives and Rash   Gluten Meal Other (See Comments)    Other reaction(s): Psychosis (intolerance)   Clozaril [Clozapine]     Muscles stiffened up   Gluten    Octacosanol    Medications: See med rec.  Review of Systems: No headache, visual changes, nausea,  vomiting, diarrhea, constipation, dizziness, abdominal pain, skin rash, fevers, chills, night sweats, swollen lymph nodes, weight loss, chest pain, body aches, joint swelling, muscle aches, shortness of breath, mood changes, visual or auditory hallucinations.  Objective:    BP 118/78 (BP Location: Right Arm, Patient Position: Sitting, Cuff Size: Normal)   Pulse 88   Ht 6' (1.829 m)   Wt 190 lb 12.8 oz (86.5 kg)   SpO2 94%   BMI 25.88 kg/m   General: Well Developed, well nourished, and in no acute distress. Neuro: Alert and oriented x3, extra-ocular muscles intact, sensation grossly intact. Cranial nerves II through XII are intact, motor, sensory, and coordinative functions are all intact. HEENT: Normocephalic, atraumatic, pupils equal round reactive to light, neck supple, no masses, no lymphadenopathy, thyroid  nonpalpable. Oropharynx, nasopharynx, external ear canals are unremarkable. Skin: Warm and dry, no rashes noted. Cardiac: Regular rate and rhythm, no murmurs rubs or gallops.  Respiratory: Clear to auscultation bilaterally. Not using accessory muscles, speaking in full sentences. Abdominal: Soft, nontender, nondistended, positive bowel sounds, no masses, no organomegaly. Musculoskeletal: Shoulder, elbow, wrist, hip, knee, ankle stable, and with full range of motion.  Impression and Recommendations:    Wellness examination Assessment & Plan: Routine HCM labs reviewed - labs with HWW, overall reassuring. HCM reviewed/discussed. Anticipatory guidance regarding healthy weight, lifestyle and choices given. Recommend healthy diet.  Recommend approximately 150 minutes/week of moderate intensity exercise Recommend regular dental and vision exams Always use seatbelt/lap and shoulder restraints Recommend using smoke alarms and checking batteries at least twice a year Recommend using sunscreen when outside Discussed immunization recommendations   Some concerns about fatigue upon waking,  daytime somnolence, some snoring while sleeping. Discussed considerations related to further evaluation - will consider and let us  know. Also reviewed medication options with Zepbound approved for sleep apnea. Prefer pulm for referral if proceeding with it  Return in about 6 months (around 06/22/2024).   ___________________________________________ Azure Budnick de [place], MD, ABFM, CAQSM Primary Care and Sports Medicine [facility]

## 2023-12-28 ENCOUNTER — Other Ambulatory Visit: Payer: Self-pay

## 2023-12-28 NOTE — Progress Notes (Signed)
 Specialty Pharmacy Refill Coordination Note  BYNUM MCCULLARS is a 33 y.o. male assessed today regarding refills of clinic administered specialty medication(s) ARIPiprazole  (Abilify  Maintena)   Clinic requested Delivery   Delivery date: 01/01/24   Verified address: [facility], [address], [place], [place] [address]   Medication will be filled on 12/31/23.

## 2023-12-29 ENCOUNTER — Encounter (INDEPENDENT_AMBULATORY_CARE_PROVIDER_SITE_OTHER): Payer: Self-pay | Admitting: Internal Medicine

## 2023-12-29 ENCOUNTER — Ambulatory Visit (INDEPENDENT_AMBULATORY_CARE_PROVIDER_SITE_OTHER): Admitting: Internal Medicine

## 2023-12-29 VITALS — BP 104/67 | HR 74 | Temp 97.8°F | Ht 71.0 in | Wt 183.0 lb

## 2023-12-29 DIAGNOSIS — T4395XA Adverse effect of unspecified psychotropic drug, initial encounter: Secondary | ICD-10-CM | POA: Diagnosis not present

## 2023-12-29 DIAGNOSIS — E88819 Insulin resistance, unspecified: Secondary | ICD-10-CM | POA: Diagnosis not present

## 2023-12-29 DIAGNOSIS — R29818 Other symptoms and signs involving the nervous system: Secondary | ICD-10-CM | POA: Insufficient documentation

## 2023-12-29 DIAGNOSIS — E66811 Obesity, class 1: Secondary | ICD-10-CM

## 2023-12-29 DIAGNOSIS — T50905A Adverse effect of unspecified drugs, medicaments and biological substances, initial encounter: Secondary | ICD-10-CM

## 2023-12-29 DIAGNOSIS — Z6825 Body mass index (BMI) 25.0-25.9, adult: Secondary | ICD-10-CM

## 2023-12-29 MED ORDER — METFORMIN HCL ER 500 MG PO TB24: 500.0000 mg | ORAL_TABLET | Freq: Two times a day (BID) | ORAL | 0 refills | Status: DC

## 2023-12-29 NOTE — Assessment & Plan Note (Signed)
 When considering his peak weight of 227 he has lost 20% of total body weight loss on medically supervised weight management program inclusive of metformin .  Bioimpedance information shows reductions in body fat percentage now at 22% with a goal of less than 20%.  His visceral fat rating has also improved currently at 7 down from a 10.  So overall there is improvements in body composition and he is close to reaching his goal.  This far exceeds what is expected.  There is some shared interest about the role of GLP-1 for weight loss maintenance he may benefit from a low-dose of the medication but this will be at the discretion of his insurance based on his new BMI and body composition.  I also reviewed with father that although GLP-1's are being studied for mental health we do not have data yet to support the use for alcohol cessation or management of mental health disorder studies are ongoing.

## 2023-12-29 NOTE — Assessment & Plan Note (Signed)
 There are some reports of loud snoring and gasping for air patient reports having a sleep study more than a decade ago but results are not available they suspect he has sleep apnea.  He may benefit from a follow-up sleep study as sleep apnea could also be seen at normal BMI ranges.  They will think about having a repeat sleep study to let me know at the next visit.

## 2023-12-29 NOTE — Progress Notes (Signed)
 Office: [phone]  /  Fax: [phone]  Weight Summary and Body Composition Analysis (BIA)  Vitals Temp: 97.8 F (36.6 C) BP: 104/67 Pulse Rate: 74 SpO2: 95 %   Anthropometric Measurements Height: 5' 11 (1.803 m) Weight: 183 lb (83 kg) BMI (Calculated): 25.53 Weight at Last Visit: 191 lb Weight Lost Since Last Visit: 8 lb Weight Gained Since Last Visit: 0 Starting Weight: 210 lb Total Weight Loss (lbs): 27 lb (12.2 kg) Peak Weight: 227 lb   Body Composition  Body Fat %: 22.2 % Fat Mass (lbs): 40.8 lbs Muscle Mass (lbs): 135.8 lbs Total Body Water (lbs): 94 lbs Visceral Fat Rating : 7    RMR: 2045  Today's Visit #: 18  Starting Date: 06/17/22   Subjective   Chief Complaint: Obesity  Interval History Discussed the use of AI scribe software for clinical note transcription with the patient, who gave verbal consent to proceed.  History of Present Illness   Seth Fields is a 33 year old male who presents for medical weight management.  He has lost eight pounds since his last office visit. He adheres to a 1200 calorie nutrition plan approximately 90% of the time, consuming healthy foods, ensuring adequate protein intake, maintaining hydration, and not skipping meals. His protein sources include shakes, chicken, lamb, duck, and yogurt. He exercises five days a week for 45 minutes, achieving 10,000 steps daily, and engages in strength training and cardio. He also plays tennis, contributing to his physical activity.  His body fat percentage has decreased to 22%, and his visceral fat level is at 7. He started at a weight of 225 pounds and has made significant progress in his weight management journey.  He is currently taking metformin  and requires a refill. He also takes a multivitamin regularly.  There is a discussion about the potential use of a medication called Zepbound for weight maintenance and its possible mental health benefits, although he is  hesitant about using injections.  He has a history of snoring and possible sleep apnea, but he has not undergone a recent sleep study. A previous sleep study was conducted in 2008, but no definitive diagnosis was made.     Challenges affecting patient progress: medical comorbidities and presence of obesogenic drugs.    Pharmacotherapy for weight management: He is currently taking Metformin  (off label use for incretin effect and / or insulin  resistance and / or diabetes prevention) with adequate clinical response  and without side effects..   Assessment and Plan   Treatment Plan For Obesity:  Recommended Dietary Goals  Dijon is currently in the action stage of change. As such, his goal is to continue weight management plan. He has agreed to: continue current plan  Behavioral Health and Counseling  We discussed the following behavioral modification strategies today: continue to work on maintaining a reduced calorie state, getting the recommended amount of protein, incorporating whole foods, making healthy choices, staying well hydrated and practicing mindfulness when eating..  Additional education and resources provided today: None  Recommended Physical Activity Goals  Boris has been advised to work up to 150 minutes of moderate intensity aerobic activity a week and strengthening exercises 2-3 times per week for cardiovascular health, weight loss maintenance and preservation of muscle mass.   He has agreed to :  Think about enjoyable ways to increase daily physical activity and overcoming barriers to exercise and Increase physical activity in their day and reduce sedentary time (increase NEAT).  Medical Interventions and Pharmacotherapy  His mother and father are interested in some of the reported pleiotropic effects of GLP-1 treatment from a mental health and alcohol perspective.  I discussed with him that these are not FDA approved medications and although GLP-1's could be used for  weight loss maintenance in the treatment of sleep apnea he would have to undergo a sleep study to confirm sleep disordered breathing and it would also be at the discretion of his insurance.  We also discussed paying for medication out-of-pocket.  Associated Conditions Impacted by Obesity Treatment  Weight gain due to medication Assessment & Plan: Patient is on several weight promoting psychotropic medication which she requires and have been helpful from a mental health standpoint.  He has managed to lose weight even in the presence of these.  He is also on metformin  to help offset some of the weight gain and metabolic derangements.  He will continue medication no adverse effects reported.  GLP-1 treatment may help with weight loss maintenance as patient requires psychotropic medications for mental health.   Insulin  resistance Assessment & Plan: His most recent hemoglobin A1c was 5.1 and insulin  levels have decreased by 50% at 11.7 overall markers of improved glycemic control.  He will continue on metformin  for pharmacoprophylaxis also to offset weight gain associated with psychotropic medication.    Orders: -     metFORMIN  HCl ER; Take 1 tablet (500 mg total) by mouth 2 (two) times daily with a meal.  Dispense: 180 tablet; Refill: 0  Obesity, Class I with current BMI of 25 Assessment & Plan: When considering his peak weight of 227 he has lost 20% of total body weight loss on medically supervised weight management program inclusive of metformin .  Bioimpedance information shows reductions in body fat percentage now at 22% with a goal of less than 20%.  His visceral fat rating has also improved currently at 7 down from a 10.  So overall there is improvements in body composition and he is close to reaching his goal.  This far exceeds what is expected.  There is some shared interest about the role of GLP-1 for weight loss maintenance he may benefit from a low-dose of the medication but this will be at  the discretion of his insurance based on his new BMI and body composition.  I also reviewed with father that although GLP-1's are being studied for mental health we do not have data yet to support the use for alcohol cessation or management of mental health disorder studies are ongoing.  Orders: -     metFORMIN  HCl ER; Take 1 tablet (500 mg total) by mouth 2 (two) times daily with a meal.  Dispense: 180 tablet; Refill: 0  Suspected sleep apnea Assessment & Plan: There are some reports of loud snoring and gasping for air patient reports having a sleep study more than a decade ago but results are not available they suspect he has sleep apnea.  He may benefit from a follow-up sleep study as sleep apnea could also be seen at normal BMI ranges.  They will think about having a repeat sleep study to let me know at the next visit.            Objective   Physical Exam:  Blood pressure 104/67, pulse 74, temperature 97.8 F (36.6 C), height 5' 11 (1.803 m), weight 183 lb (83 kg), SpO2 95%. Body mass index is 25.52 kg/m.  General: He is overweight, cooperative, alert, well developed, and in no acute distress. PSYCH: Has  normal mood, affect and thought process.   HEENT: EOMI, sclerae are anicteric. Lungs: Normal breathing effort, no conversational dyspnea. Extremities: No edema.  Neurologic: No gross sensory or motor deficits. No tremors or fasciculations noted.    Diagnostic Data Reviewed:  BMET    Component Value Date/Time   NA 139 11/02/2023 0850   K 4.3 11/02/2023 0850   CL 101 11/02/2023 0850   CO2 23 11/02/2023 0850   GLUCOSE 100 (H) 11/02/2023 0850   GLUCOSE 98 01/27/2015 1715   BUN 17 11/02/2023 0850   CREATININE 1.06 11/02/2023 0850   CREATININE 1.28 08/08/2012 1555   CALCIUM 9.7 11/02/2023 0850   GFRNONAA >60 01/27/2015 1715   GFRAA >60 01/27/2015 1715   Lab Results  Component Value Date   HGBA1C 5.0 11/02/2023   HGBA1C 5.0 06/18/2022   Lab Results  Component  Value Date   INSULIN  9.2 11/02/2023   INSULIN  23.4 06/18/2022   Lab Results  Component Value Date   TSH 2.000 06/18/2022   CBC    Component Value Date/Time   WBC 10.4 11/02/2023 0850   WBC 10.2 01/27/2015 1715   RBC 5.40 11/02/2023 0850   RBC 5.58 01/27/2015 1715   HGB 15.8 11/02/2023 0850   HCT 47.0 11/02/2023 0850   PLT 371 11/02/2023 0850   MCV 87 11/02/2023 0850   MCH 29.3 11/02/2023 0850   MCH 29.7 01/27/2015 1715   MCHC 33.6 11/02/2023 0850   MCHC 34.6 01/27/2015 1715   RDW 13.7 11/02/2023 0850   Iron Studies No results found for: IRON, TIBC, FERRITIN, IRONPCTSAT Lipid Panel     Component Value Date/Time   CHOL 207 (H) 11/02/2023 0850   TRIG 224 (H) 11/02/2023 0850   HDL 40 11/02/2023 0850   LDLCALC 127 (H) 11/02/2023 0850   Hepatic Function Panel     Component Value Date/Time   PROT 7.1 11/02/2023 0850   ALBUMIN 4.6 11/02/2023 0850   AST 21 11/02/2023 0850   ALT 19 11/02/2023 0850   ALKPHOS 87 11/02/2023 0850   BILITOT 0.5 11/02/2023 0850   BILIDIR <0.1 07/24/2011 2332   IBILI NOT CALCULATED 07/24/2011 2332      Component Value Date/Time   TSH 2.000 06/18/2022 0919   Nutritional Lab Results  Component Value Date   VD25OH 75.9 11/02/2023   VD25OH 38.6 06/18/2022    Medications: Outpatient Encounter Medications as of 12/29/2023  Medication Sig Note   Adalimumab 40 MG/0.8ML PSKT Inject 40 mg into the skin every 14 (fourteen) days. 02/05/2015: Next injection due on Thursday 02/08/15.    albuterol  (VENTOLIN  HFA) 108 (90 Base) MCG/ACT inhaler Inhale 2 puffs into the lungs every 6 (six) hours as needed for wheezing.    amphetamine-dextroamphetamine (ADDERALL) 20 MG tablet Take 1 tablet by mouth daily in the afternoon.    ARIPiprazole  ER (ABILIFY  MAINTENA) 400 MG PRSY prefilled syringe Inject once monthly.    ARIPiprazole  ER (ABILIFY  MAINTENA) 400 MG PRSY prefilled syringe Inject once monthly    ARIPiprazole  ER (ABILIFY  MAINTENA) 400 MG PRSY  prefilled syringe Inject 400 mg into the muscle every month    ARIPiprazole  ER (ABILIFY  MAINTENA) 400 MG PRSY prefilled syringe q monthly    ARIPiprazole  ER (ABILIFY  MAINTENA) 400 MG PRSY prefilled syringe Inject into the muscle every 30 (thirty) days.    ARIPiprazole  ER (ABILIFY  MAINTENA) 400 MG PRSY prefilled syringe Inject IM once monthly.    ARIPiprazole  ER (ABILIFY  MAINTENA) 400 MG PRSY prefilled syringe q monthly    ascorbic  acid (VITAMIN C) 500 MG tablet Take 1 tablet by mouth daily.    azaTHIOprine  (IMURAN ) 50 MG tablet Take 100 mg by mouth daily. 02/05/2015: Pharmacy verified medication.    B Complex-Biotin-FA (B-50 COMPLEX PO) Take 1 capsule by mouth daily.    benztropine (COGENTIN) 0.5 MG tablet Take 0.5 mg by mouth daily.    CAPLYTA 21 MG CAPS Take 1 capsule by mouth every morning.    cholecalciferol  (VITAMIN D ) 1000 UNITS tablet Take 5,000 Units by mouth daily.    clonazePAM  (KLONOPIN ) 1 MG tablet Take 1 mg by mouth 2 (two) times daily as needed for anxiety. 02/05/2015: Pt mother verified medication.   DULoxetine (CYMBALTA) 60 MG capsule Take 60 mg by mouth 2 (two) times daily.    folic acid  (FOLVITE ) 1 MG tablet Take 1 mg by mouth daily. 02/05/2015: Pharmacy verified medication.   lamoTRIgine  (LAMICTAL  XR) 50 MG 24 hour tablet Take 1 tablet by mouth at bedtime. 02/05/2015: Pharmacy verified medication.   lisdexamfetamine (VYVANSE) 70 MG capsule Take 1 capsule by mouth in the morning.    Misc Natural Products (T-RELIEF CBD+13 SL) Take 1 capsule by mouth daily. 10/08/2020: CBD OIL CAPSULE   Multiple Vitamins-Minerals (MULTIVITAMINS THER. W/MINERALS) TABS Take 1 tablet by mouth daily.    naltrexone (DEPADE) 50 MG tablet Take 50 mg by mouth daily.    Niacin, Antihyperlipidemic, 500 MG TABS Take by mouth.    Omega-3 Fatty Acids (FISH OIL) 1000 MG CAPS Take 1 capsule by mouth daily.    perphenazine (TRILAFON) 2 MG tablet Take 2 mg by mouth at bedtime.    SUMAtriptan  (IMITREX ) 50 MG tablet Take 1  tablet (50 mg total) by mouth as needed for migraine.    XIFAXAN 200 MG tablet Take 200 mg by mouth 2 (two) times daily.    [DISCONTINUED] metFORMIN  (GLUCOPHAGE -XR) 500 MG 24 hr tablet Take 1 tablet (500 mg total) by mouth 2 (two) times daily with a meal.    metFORMIN  (GLUCOPHAGE -XR) 500 MG 24 hr tablet Take 1 tablet (500 mg total) by mouth 2 (two) times daily with a meal.    No facility-administered encounter medications on file as of 12/29/2023.     Follow-Up   Return in about 4 weeks (around 01/26/2024) for For Weight Mangement with Dr. Francyne.SABRA He was informed of the importance of frequent follow up visits to maximize his success with intensive lifestyle modifications for his multiple health conditions.  Attestation Statement   Reviewed by clinician on day of visit: allergies, medications, problem list, medical history, surgical history, family history, social history, and previous encounter notes.     Lucas Francyne, MD

## 2023-12-29 NOTE — Assessment & Plan Note (Addendum)
 Patient is on several weight promoting psychotropic medication which she requires and have been helpful from a mental health standpoint.  He has managed to lose weight even in the presence of these.  He is also on metformin  to help offset some of the weight gain and metabolic derangements.  He will continue medication no adverse effects reported.  GLP-1 treatment may help with weight loss maintenance as patient requires psychotropic medications for mental health.

## 2023-12-29 NOTE — Assessment & Plan Note (Signed)
 His most recent hemoglobin A1c was 5.1 and insulin levels have decreased by 50% at 11.7 overall markers of improved glycemic control.  He will continue on metformin for pharmacoprophylaxis also to offset weight gain associated with psychotropic medication.

## 2023-12-31 ENCOUNTER — Other Ambulatory Visit (HOSPITAL_COMMUNITY): Payer: Self-pay

## 2023-12-31 ENCOUNTER — Other Ambulatory Visit: Payer: Self-pay

## 2023-12-31 NOTE — Progress Notes (Signed)
 Spoke with Seth Fields at office. Patient will be filling Abilify  injection at [facility] pharmacy going forward. Dis-enrolling.

## 2024-01-03 ENCOUNTER — Encounter (HOSPITAL_COMMUNITY): Payer: Self-pay

## 2024-01-03 ENCOUNTER — Other Ambulatory Visit: Payer: Self-pay

## 2024-01-03 ENCOUNTER — Emergency Department (HOSPITAL_COMMUNITY)
Admission: EM | Admit: 2024-01-03 | Discharge: 2024-01-03 | Disposition: A | Discharge status: Home / Self Care | Attending: Student | Admitting: Student

## 2024-01-03 DIAGNOSIS — F419 Anxiety disorder, unspecified: Secondary | ICD-10-CM | POA: Diagnosis not present

## 2024-01-03 DIAGNOSIS — R442 Other hallucinations: Secondary | ICD-10-CM | POA: Diagnosis not present

## 2024-01-03 DIAGNOSIS — Z0001 Encounter for general adult medical examination with abnormal findings: Secondary | ICD-10-CM | POA: Insufficient documentation

## 2024-01-03 DIAGNOSIS — Z0000 Encounter for general adult medical examination without abnormal findings: Secondary | ICD-10-CM

## 2024-01-03 DIAGNOSIS — R42 Dizziness and giddiness: Secondary | ICD-10-CM | POA: Diagnosis not present

## 2024-01-03 DIAGNOSIS — J45909 Unspecified asthma, uncomplicated: Secondary | ICD-10-CM | POA: Insufficient documentation

## 2024-01-03 DIAGNOSIS — R000 Tachycardia, unspecified: Secondary | ICD-10-CM | POA: Diagnosis not present

## 2024-01-03 NOTE — Discharge Instructions (Signed)
 Thank for letting us  evaluate you today.  Your physical exam was unremarkable.  Return to Emergency Department if you experience suicidal ideation, homicidal ideation, self injury.

## 2024-01-03 NOTE — ED Provider Notes (Signed)
 [place] EMERGENCY DEPARTMENT AT [facility] Provider Note   CSN: [id number] Arrival date & time: 01/03/24  2109     Patient presents with: Medical Clearance   Seth Fields is a 33 y.o. male with a past medical history of Crohn's disease, IDA, asthma, HLD, migraines, schizoaffective disorder presents to emergency department via ambulance for evaluation.  He reports that he feels like bugs are crawling on him.  According to EMS and family, this is normal for him from his schizoaffective disorder.  Family did not believe that patient needed evaluation but patient wished to be evaluated.  He endorses that he has marijuana twice daily but today smoked cocaine. He denies SI, HI, self injury, etoh   HPI     Prior to Admission medications   Medication Sig Start Date End Date Taking? Authorizing Provider  Adalimumab 40 MG/0.8ML PSKT Inject 40 mg into the skin every 14 (fourteen) days.    [provider]  albuterol  (VENTOLIN  HFA) 108 (90 Base) MCG/ACT inhaler Inhale 2 puffs into the lungs every 6 (six) hours as needed for wheezing. 12/19/22   [place], Quintin PARAS, MD  amphetamine-dextroamphetamine (ADDERALL) 20 MG tablet Take 1 tablet by mouth daily in the afternoon. 02/05/20   [provider]  ARIPiprazole  ER (ABILIFY  MAINTENA) 400 MG PRSY prefilled syringe Inject once monthly. 05/13/23     ARIPiprazole  ER (ABILIFY  MAINTENA) 400 MG PRSY prefilled syringe Inject once monthly 06/16/23     ARIPiprazole  ER (ABILIFY  MAINTENA) 400 MG PRSY prefilled syringe Inject 400 mg into the muscle every month 07/09/23     ARIPiprazole  ER (ABILIFY  MAINTENA) 400 MG PRSY prefilled syringe q monthly 08/11/23     ARIPiprazole  ER (ABILIFY  MAINTENA) 400 MG PRSY prefilled syringe Inject into the muscle every 30 (thirty) days. 10/12/23     ARIPiprazole  ER (ABILIFY  MAINTENA) 400 MG PRSY prefilled syringe Inject IM once monthly. 10/28/23     ARIPiprazole  ER (ABILIFY  MAINTENA) 400 MG PRSY prefilled  syringe q monthly 11/11/23     ascorbic acid (VITAMIN C) 500 MG tablet Take 1 tablet by mouth daily.    [provider]  azaTHIOprine  (IMURAN ) 50 MG tablet Take 100 mg by mouth daily. 07/21/11   [provider]  B Complex-Biotin-FA (B-50 COMPLEX PO) Take 1 capsule by mouth daily.    [provider]  benztropine (COGENTIN) 0.5 MG tablet Take 0.5 mg by mouth daily. 01/05/23   [provider]  CAPLYTA 21 MG CAPS Take 1 capsule by mouth every morning. 06/01/23   [provider]  cholecalciferol  (VITAMIN D ) 1000 UNITS tablet Take 5,000 Units by mouth daily.    [provider]  clonazePAM  (KLONOPIN ) 1 MG tablet Take 1 mg by mouth 2 (two) times daily as needed for anxiety.    [provider]  DULoxetine (CYMBALTA) 60 MG capsule Take 60 mg by mouth 2 (two) times daily. 09/24/20   [provider]  folic acid  (FOLVITE ) 1 MG tablet Take 1 mg by mouth daily.    [provider]  lamoTRIgine  (LAMICTAL  XR) 50 MG 24 hour tablet Take 1 tablet by mouth at bedtime.    [provider]  lisdexamfetamine (VYVANSE) 70 MG capsule Take 1 capsule by mouth in the morning. 07/19/19   [provider]  metFORMIN  (GLUCOPHAGE -XR) 500 MG 24 hr tablet Take 1 tablet (500 mg total) by mouth 2 (two) times daily with a meal. 12/29/23   Francyne Romano, MD  Misc Natural Products (T-RELIEF  CBD+13 SL) Take 1 capsule by mouth daily.    [provider]  Multiple Vitamins-Minerals (MULTIVITAMINS THER. W/MINERALS) TABS Take 1 tablet by mouth daily.    [provider]  naltrexone (DEPADE) 50 MG tablet Take 50 mg by mouth daily.    [provider]  Niacin, Antihyperlipidemic, 500 MG TABS Take by mouth. 02/11/18   [provider]  Omega-3 Fatty Acids (FISH OIL) 1000 MG CAPS Take 1 capsule by mouth daily.    [provider]  perphenazine (TRILAFON) 2 MG tablet Take 2 mg by mouth at bedtime. 12/20/23   [provider]  SUMAtriptan  (IMITREX ) 50 MG tablet Take 1 tablet (50 mg total) by mouth as needed for migraine. 07/07/23   [place], Quintin PARAS, MD  XIFAXAN 200 MG tablet Take 200 mg by mouth 2 (two) times daily. 01/01/23   [provider]    Allergies: Cefaclor, Gluten meal, Clozaril [clozapine], Gluten, and Octacosanol    Review of Systems  Psychiatric/Behavioral:  The patient is nervous/anxious.     Updated Vital Signs BP 117/74   Pulse (!) 106   Temp 98.5 F (36.9 C) (Oral)   Resp 20   Ht 5' 11 (1.803 m)   Wt 83 kg   SpO2 95%   BMI 25.52 kg/m   Physical Exam Vitals and nursing note reviewed.  Constitutional:      General: He is not in acute distress.    Appearance: Normal appearance.  HENT:     Head: Normocephalic and atraumatic.   Eyes:     Conjunctiva/sclera: Conjunctivae normal.    Cardiovascular:     Rate and Rhythm: Normal rate.  Pulmonary:     Effort: Pulmonary effort is normal. No respiratory distress.   Skin:    Coloration: Skin is not jaundiced or pale.   Neurological:     Mental Status: He is alert and oriented to person, place, and time. Mental status is at baseline.     GCS: GCS eye subscore is 4. GCS verbal subscore is 5. GCS motor subscore is 6.   Psychiatric:        Attention and Perception: Attention and perception normal. He does not perceive auditory or visual hallucinations.        Mood and Affect: Mood is anxious.        Behavior: Behavior normal. Behavior is cooperative.        Thought Content: Thought content normal. Thought content does not include homicidal or suicidal ideation.        Cognition and Memory: Cognition normal.        Judgment: Judgment normal.     (all labs ordered are listed, but only abnormal results are displayed) Labs Reviewed - No data to display   EKG: None  Radiology: No results found.   Medications Ordered in the ED - No data to display                                  Medical Decision  Making Amount and/or Complexity of Data Reviewed Labs: ordered.   Patient presents to the ED for concern of medical clearance, physical exam, this involves an extensive number of treatment options, and is a complaint that carries with it a high risk of complications and morbidity.  The differential diagnosis includes drug toxidrome, electrolyte abnormality, anxiety, behavioral disturbance   Co morbidities that complicate the patient evaluation  Polysubstance  abuse   Additional history obtained:  Additional history obtained from [facility]   External records from outside source obtained and reviewed including triage RN note, EMS note, mother and father via telephone     Problem List / ED Course:  Medical clearance Cocaine use Seems to be at baseline besides mild anxiety.  He is due for his 9 PM psychiatric meds He is fully alert and oriented.  He denies any complaints of pain.  He does not wish to be seen by TTS as he is well-managed at home He refuses med clearance lab work, ativan  Physical exam is unremarkable Verified patient with mother via telephone per HIPAA standards.  She agrees with not obtaining lab work.  She will pick up patient and provide him with nighttime meds and monitoring him throughout the evening   Reevaluation:  After the interventions noted above, I reevaluated the patient and found that they have :improved     Dispostion:  After consideration of the diagnostic results and the patients response to treatment, I feel that the patent would benefit from outpatient management.   Discussed ED workup, disposition, return to ED precautions with patient who expresses understanding agrees with plan.  All questions answered to their satisfaction.  They are agreeable to plan.  Discharge instructions provided on paperwork  Final diagnoses:  Normal physical exam    ED Discharge Orders     None          Minnie Tinnie BRAVO, PA 01/03/24 2256     Albertina Dixon, MD 01/04/24 [phone]

## 2024-01-03 NOTE — ED Triage Notes (Signed)
 Pt BIBA after being picked up from sdtating that he felt dizzy and that bugs were crawling on him. Hx of cocaine use x7hrs ago. Use of THC as well.

## 2024-01-06 DIAGNOSIS — F250 Schizoaffective disorder, bipolar type: Secondary | ICD-10-CM | POA: Diagnosis not present

## 2024-01-13 DIAGNOSIS — F3181 Bipolar II disorder: Secondary | ICD-10-CM | POA: Diagnosis not present

## 2024-01-13 DIAGNOSIS — F900 Attention-deficit hyperactivity disorder, predominantly inattentive type: Secondary | ICD-10-CM | POA: Diagnosis not present

## 2024-01-13 DIAGNOSIS — F251 Schizoaffective disorder, depressive type: Secondary | ICD-10-CM | POA: Diagnosis not present

## 2024-01-13 DIAGNOSIS — Z5181 Encounter for therapeutic drug level monitoring: Secondary | ICD-10-CM | POA: Diagnosis not present

## 2024-01-19 DIAGNOSIS — F250 Schizoaffective disorder, bipolar type: Secondary | ICD-10-CM | POA: Diagnosis not present

## 2024-01-27 ENCOUNTER — Encounter (INDEPENDENT_AMBULATORY_CARE_PROVIDER_SITE_OTHER): Payer: Self-pay | Admitting: Internal Medicine

## 2024-01-27 ENCOUNTER — Ambulatory Visit (INDEPENDENT_AMBULATORY_CARE_PROVIDER_SITE_OTHER): Admitting: Internal Medicine

## 2024-01-27 VITALS — BP 106/72 | HR 95 | Temp 98.5°F | Ht 71.0 in | Wt 183.0 lb

## 2024-01-27 DIAGNOSIS — E66811 Obesity, class 1: Secondary | ICD-10-CM | POA: Diagnosis not present

## 2024-01-27 DIAGNOSIS — E88819 Insulin resistance, unspecified: Secondary | ICD-10-CM

## 2024-01-27 DIAGNOSIS — Z6825 Body mass index (BMI) 25.0-25.9, adult: Secondary | ICD-10-CM

## 2024-01-27 MED ORDER — TIRZEPATIDE-WEIGHT MANAGEMENT 2.5 MG/0.5ML ~~LOC~~ SOLN: 2.5000 mg | SUBCUTANEOUS | 0 refills | Status: DC

## 2024-01-27 NOTE — Progress Notes (Signed)
 Office: [phone]  /  Fax: [phone]  Weight Summary and Body Composition Analysis (BIA)  Vitals Temp: 98.5 F (36.9 C) BP: 106/72 Pulse Rate: 95 SpO2: 96 %   Anthropometric Measurements Height: 5' 11 (1.803 m) Weight: 183 lb (83 kg) BMI (Calculated): 25.53 Weight at Last Visit: 183 lb Weight Lost Since Last Visit: 0 lb Weight Gained Since Last Visit: 0lb Starting Weight: 210 lb Total Weight Loss (lbs): 27 lb (12.2 kg) Peak Weight: 227 lb   Body Composition  Body Fat %: 21.7 % Fat Mass (lbs): 39.8 lbs Muscle Mass (lbs): 136.2 lbs Total Body Water (lbs): 94.4 lbs Visceral Fat Rating : 7    RMR: 2045  Today's Visit #: 19  Starting Date: 06/17/22   Subjective   Chief Complaint: Obesity  Interval History Discussed the use of AI scribe software for clinical note transcription with the patient, who gave verbal consent to proceed.  History of Present Illness   Seth Fields is a 33 year old male who presents for medical weight management.  He has not gained any weight recently, although he occasionally consumes extra calories by eating popsicles at work. His waist size is similar, and his shirts are fitting less tightly. He continues to play tennis regularly. He recalls that his body fat percentage was previously measured at 26.9% and is now 21%, and his visceral fat was previously 10% and is now 7%. He recalls that his BMI was initially around 30. He has been using metformin , which he finds helpful.  He has a history of inflammatory bowel disease, specifically Crohn's disease, and experiences occasional constipation, even with a diet rich in fruit and fiber. This constipation occurs sometimes during midday. He receives weekly Humira injections for his Crohn's disease, administered by a caregiver who is familiar with his medical needs.  He has a history of schizoaffective disorder, which is being managed with psychotropic medications.  He has a history  of hyperlipidemia, but there was no specific discussion about current management or changes to treatment in this visit.       Challenges affecting patient progress: Mental health problems presence of obesogenic drugs.    Pharmacotherapy for weight management: He is currently taking Metformin  (off label use for incretin effect and / or insulin  resistance and / or diabetes prevention) with adequate clinical response  and without side effects..   Assessment and Plan   Treatment Plan For Obesity:  Recommended Dietary Goals  Keagen is currently in the action stage of change. As such, his goal is to continue weight management plan. He has agreed to: continue current plan  Behavioral Health and Counseling  We discussed the following behavioral modification strategies today: continue to work on maintaining a reduced calorie state, getting the recommended amount of protein, incorporating whole foods, making healthy choices, staying well hydrated and practicing mindfulness when eating..  Additional education and resources provided today: Handout on the management of GLP-1 related side effects  Recommended Physical Activity Goals  Blessing has been advised to work up to 150 minutes of moderate intensity aerobic activity a week and strengthening exercises 2-3 times per week for cardiovascular health, weight loss maintenance and preservation of muscle mass.   He has agreed to :  Continue current level of physical activity   Medical Interventions and Pharmacotherapy  We discussed various medication options to help Anes with his weight loss efforts and we both agreed to : Start anti-obesity medication.  In addition to reduced calorie nutrition plan (  RCNP), behavioral strategies and physical activity, Gaven would benefit from pharmacotherapy to assist with hunger signals, satiety and cravings. This will reduce obesity-related health risks by inducing weight loss, and help reduce food consumption  and adherence to [facility]) . It may also improve QOL by improving self-confidence and reduce the  setbacks associated with metabolic adaptations.  Patient and family requesting to starting GLP-1 treatment for weight loss maintenance with the hopes that this may help the patient from a mental health and substance use standpoint.  We discussed the use of GLP-1 for this purpose of this is considered off label as although there is ongoing studies there is no data to guide treatment.  Regardless he has had successful weight loss and is worried about weight regain and therefore would benefit from GLP-1 for weight loss maintenance.  After discussion of treatment options, mechanisms of action, benefits, side effects, contraindications and shared decision making he is agreeable to starting Zepbound  2.5 mg once a week through [facility]. Patient also made aware that medication is indicated for long-term management of obesity and the risk of weight regain following discontinuation of treatment and hence the importance of adhering to medical weight loss plan.    Associated Conditions Impacted by Obesity Treatment  Assessment & Plan Obesity, Class I, BMI 30-34.9 Treylin is undergoing medical weight management with a goal to reduce body fat percentage to below 20%. His body fat percentage has decreased from 26.9% to 21%, and visceral fat from 10 to 7. Zepbound , a GLP-1 receptor agonist, is considered for weight maintenance. It affects the hedonic, hunger, and satiety centers in the brain, reducing food noise and hunger, and slows gastric emptying, enhancing insulin  production and reducing hepatic glucose output. Common side effects include nausea, constipation, and upset stomach, with rare occurrences of pancreatitis. Insurance coverage is limited, necessitating out-of-pocket payment. The starting dose is 2.5 mg weekly, costing $349 per month. - Initiate Zepbound  at 2.5 mg once weekly for weight maintenance -  Discontinue metformin  - Provide guide on managing side effects of Zepbound  - Schedule follow-up in 4 weeks to assess medication efficacy and side effects Insulin  resistance His most recent hemoglobin A1c was 5.1 and insulin  levels have decreased by 50% at 11.7 overall markers of improved glycemic control.  Patient and family requesting initiation of GLP-1.  We will discontinue metformin  and start Zepbound       Follow-up Follow-up is necessary to monitor the effects of the new medication regimen and adjust as needed. - Schedule follow-up appointment in 4 weeks to evaluate the effects of Zepbound  and make any necessary adjustments        Objective   Physical Exam:  Blood pressure 106/72, pulse 95, temperature 98.5 F (36.9 C), height 5' 11 (1.803 m), weight 183 lb (83 kg), SpO2 96%. Body mass index is 25.52 kg/m.  General: He is overweight, cooperative, alert, well developed, and in no acute distress. PSYCH: Has normal mood, affect and thought process.   HEENT: EOMI, sclerae are anicteric. Lungs: Normal breathing effort, no conversational dyspnea. Extremities: No edema.  Neurologic: No gross sensory or motor deficits. No tremors or fasciculations noted.    Diagnostic Data Reviewed:  BMET    Component Value Date/Time   NA 139 11/02/2023 0850   K 4.3 11/02/2023 0850   CL 101 11/02/2023 0850   CO2 23 11/02/2023 0850   GLUCOSE 100 (H) 11/02/2023 0850   GLUCOSE 98 01/27/2015 1715   BUN 17 11/02/2023 0850   CREATININE 1.06 11/02/2023 0850  CREATININE 1.28 08/08/2012 1555   CALCIUM 9.7 11/02/2023 0850   GFRNONAA >60 01/27/2015 1715   GFRAA >60 01/27/2015 1715   Lab Results  Component Value Date   HGBA1C 5.0 11/02/2023   HGBA1C 5.0 06/18/2022   Lab Results  Component Value Date   INSULIN  9.2 11/02/2023   INSULIN  23.4 06/18/2022   Lab Results  Component Value Date   TSH 2.000 06/18/2022   CBC    Component Value Date/Time   WBC 10.4 11/02/2023 0850   WBC 10.2  01/27/2015 1715   RBC 5.40 11/02/2023 0850   RBC 5.58 01/27/2015 1715   HGB 15.8 11/02/2023 0850   HCT 47.0 11/02/2023 0850   PLT 371 11/02/2023 0850   MCV 87 11/02/2023 0850   MCH 29.3 11/02/2023 0850   MCH 29.7 01/27/2015 1715   MCHC 33.6 11/02/2023 0850   MCHC 34.6 01/27/2015 1715   RDW 13.7 11/02/2023 0850   Iron Studies No results found for: IRON, TIBC, FERRITIN, IRONPCTSAT Lipid Panel     Component Value Date/Time   CHOL 207 (H) 11/02/2023 0850   TRIG 224 (H) 11/02/2023 0850   HDL 40 11/02/2023 0850   LDLCALC 127 (H) 11/02/2023 0850   Hepatic Function Panel     Component Value Date/Time   PROT 7.1 11/02/2023 0850   ALBUMIN 4.6 11/02/2023 0850   AST 21 11/02/2023 0850   ALT 19 11/02/2023 0850   ALKPHOS 87 11/02/2023 0850   BILITOT 0.5 11/02/2023 0850   BILIDIR <0.1 07/24/2011 2332   IBILI NOT CALCULATED 07/24/2011 2332      Component Value Date/Time   TSH 2.000 06/18/2022 0919   Nutritional Lab Results  Component Value Date   VD25OH 75.9 11/02/2023   VD25OH 38.6 06/18/2022    Medications: Outpatient Encounter Medications as of 01/27/2024  Medication Sig Note   Adalimumab 40 MG/0.8ML PSKT Inject 40 mg into the skin every 14 (fourteen) days. 02/05/2015: Next injection due on Thursday 02/08/15.    albuterol  (VENTOLIN  HFA) 108 (90 Base) MCG/ACT inhaler Inhale 2 puffs into the lungs every 6 (six) hours as needed for wheezing.    amphetamine-dextroamphetamine (ADDERALL) 20 MG tablet Take 1 tablet by mouth daily in the afternoon.    ARIPiprazole  ER (ABILIFY  MAINTENA) 400 MG PRSY prefilled syringe Inject once monthly.    ARIPiprazole  ER (ABILIFY  MAINTENA) 400 MG PRSY prefilled syringe Inject once monthly    ARIPiprazole  ER (ABILIFY  MAINTENA) 400 MG PRSY prefilled syringe Inject 400 mg into the muscle every month    ARIPiprazole  ER (ABILIFY  MAINTENA) 400 MG PRSY prefilled syringe q monthly    ARIPiprazole  ER (ABILIFY  MAINTENA) 400 MG PRSY prefilled syringe  Inject into the muscle every 30 (thirty) days.    ARIPiprazole  ER (ABILIFY  MAINTENA) 400 MG PRSY prefilled syringe Inject IM once monthly.    ARIPiprazole  ER (ABILIFY  MAINTENA) 400 MG PRSY prefilled syringe q monthly    ascorbic acid (VITAMIN C) 500 MG tablet Take 1 tablet by mouth daily.    azaTHIOprine  (IMURAN ) 50 MG tablet Take 100 mg by mouth daily. 02/05/2015: Pharmacy verified medication.    B Complex-Biotin-FA (B-50 COMPLEX PO) Take 1 capsule by mouth daily.    benztropine (COGENTIN) 0.5 MG tablet Take 0.5 mg by mouth daily.    CAPLYTA 21 MG CAPS Take 1 capsule by mouth every morning.    cholecalciferol  (VITAMIN D ) 1000 UNITS tablet Take 5,000 Units by mouth daily.    clonazePAM  (KLONOPIN ) 1 MG tablet Take 1 mg by mouth 2 (  two) times daily as needed for anxiety. 02/05/2015: Pt mother verified medication.   DULoxetine (CYMBALTA) 60 MG capsule Take 60 mg by mouth 2 (two) times daily.    folic acid  (FOLVITE ) 1 MG tablet Take 1 mg by mouth daily. 02/05/2015: Pharmacy verified medication.   lamoTRIgine  (LAMICTAL  XR) 50 MG 24 hour tablet Take 1 tablet by mouth at bedtime. 02/05/2015: Pharmacy verified medication.   lisdexamfetamine (VYVANSE) 70 MG capsule Take 1 capsule by mouth in the morning.    metFORMIN  (GLUCOPHAGE -XR) 500 MG 24 hr tablet Take 1 tablet (500 mg total) by mouth 2 (two) times daily with a meal.    Misc Natural Products (T-RELIEF CBD+13 SL) Take 1 capsule by mouth daily. 10/08/2020: CBD OIL CAPSULE   Multiple Vitamins-Minerals (MULTIVITAMINS THER. W/MINERALS) TABS Take 1 tablet by mouth daily.    naltrexone (DEPADE) 50 MG tablet Take 50 mg by mouth daily.    Niacin, Antihyperlipidemic, 500 MG TABS Take by mouth.    Omega-3 Fatty Acids (FISH OIL) 1000 MG CAPS Take 1 capsule by mouth daily.    perphenazine (TRILAFON) 2 MG tablet Take 2 mg by mouth at bedtime.    SUMAtriptan  (IMITREX ) 50 MG tablet Take 1 tablet (50 mg total) by mouth as needed for migraine.    tirzepatide  (ZEPBOUND ) 2.5  MG/0.5ML injection vial Inject 2.5 mg into the skin once a week.    XIFAXAN 200 MG tablet Take 200 mg by mouth 2 (two) times daily.    No facility-administered encounter medications on file as of 01/27/2024.     Follow-Up   Return in about 4 weeks (around 02/24/2024) for For Weight Mangement with Dr. Francyne.SABRA He was informed of the importance of frequent follow up visits to maximize his success with intensive lifestyle modifications for his multiple health conditions.  Attestation Statement   Reviewed by clinician on day of visit: allergies, medications, problem list, medical history, surgical history, family history, social history, and previous encounter notes.     Lucas Francyne, MD

## 2024-01-27 NOTE — Assessment & Plan Note (Signed)
 His most recent hemoglobin A1c was 5.1 and insulin  levels have decreased by 50% at 11.7 overall markers of improved glycemic control.  Patient and family requesting initiation of GLP-1.  We will discontinue metformin  and start Zepbound 

## 2024-01-27 NOTE — Assessment & Plan Note (Signed)
 Seth Fields is undergoing medical weight management with a goal to reduce body fat percentage to below 20%. His body fat percentage has decreased from 26.9% to 21%, and visceral fat from 10 to 7. Zepbound , a GLP-1 receptor agonist, is considered for weight maintenance. It affects the hedonic, hunger, and satiety centers in the brain, reducing food noise and hunger, and slows gastric emptying, enhancing insulin  production and reducing hepatic glucose output. Common side effects include nausea, constipation, and upset stomach, with rare occurrences of pancreatitis. Insurance coverage is limited, necessitating out-of-pocket payment. The starting dose is 2.5 mg weekly, costing $349 per month. - Initiate Zepbound  at 2.5 mg once weekly for weight maintenance - Discontinue metformin  - Provide guide on managing side effects of Zepbound  - Schedule follow-up in 4 weeks to assess medication efficacy and side effects

## 2024-02-02 DIAGNOSIS — F250 Schizoaffective disorder, bipolar type: Secondary | ICD-10-CM | POA: Diagnosis not present

## 2024-02-02 DIAGNOSIS — K50819 Crohn's disease of both small and large intestine with unspecified complications: Secondary | ICD-10-CM | POA: Diagnosis not present

## 2024-02-03 DIAGNOSIS — Z1389 Encounter for screening for other disorder: Secondary | ICD-10-CM | POA: Diagnosis not present

## 2024-02-03 DIAGNOSIS — F250 Schizoaffective disorder, bipolar type: Secondary | ICD-10-CM | POA: Diagnosis not present

## 2024-02-16 DIAGNOSIS — F250 Schizoaffective disorder, bipolar type: Secondary | ICD-10-CM | POA: Diagnosis not present

## 2024-02-18 ENCOUNTER — Telehealth (INDEPENDENT_AMBULATORY_CARE_PROVIDER_SITE_OTHER): Payer: Self-pay | Admitting: Internal Medicine

## 2024-02-18 NOTE — Telephone Encounter (Signed)
 Patient's father came into the clinic stating patient will take his last shot this weekend and he will not get his next shipment from [place] in time. Appointment was canceled and rescheduled. Since so late in the day, informed patient's father I would send message to Dr. Francyne and he will address refill request.

## 2024-02-18 NOTE — Telephone Encounter (Signed)
 8/14 pt mother called [facility] and said the pharmacy tried to reach out to Dr.M to fill Zepbound , pt is out of voiles 2.5

## 2024-02-19 ENCOUNTER — Encounter (INDEPENDENT_AMBULATORY_CARE_PROVIDER_SITE_OTHER): Payer: Self-pay | Admitting: Internal Medicine

## 2024-02-22 ENCOUNTER — Other Ambulatory Visit (INDEPENDENT_AMBULATORY_CARE_PROVIDER_SITE_OTHER): Payer: Self-pay | Admitting: Internal Medicine

## 2024-02-22 DIAGNOSIS — E66811 Obesity, class 1: Secondary | ICD-10-CM

## 2024-02-22 DIAGNOSIS — E88819 Insulin resistance, unspecified: Secondary | ICD-10-CM

## 2024-02-22 MED ORDER — TIRZEPATIDE-WEIGHT MANAGEMENT 2.5 MG/0.5ML ~~LOC~~ SOLN
2.5000 mg | SUBCUTANEOUS | 0 refills | Status: DC
Start: 2024-02-22 — End: 2024-03-01

## 2024-02-22 NOTE — Progress Notes (Signed)
 refill

## 2024-02-25 ENCOUNTER — Ambulatory Visit (INDEPENDENT_AMBULATORY_CARE_PROVIDER_SITE_OTHER): Admitting: Internal Medicine

## 2024-03-01 ENCOUNTER — Encounter (INDEPENDENT_AMBULATORY_CARE_PROVIDER_SITE_OTHER): Payer: Self-pay | Admitting: Internal Medicine

## 2024-03-01 ENCOUNTER — Ambulatory Visit (INDEPENDENT_AMBULATORY_CARE_PROVIDER_SITE_OTHER): Admitting: Internal Medicine

## 2024-03-01 VITALS — BP 96/64 | HR 68 | Temp 97.9°F | Ht 71.0 in | Wt 183.0 lb

## 2024-03-01 DIAGNOSIS — E88819 Insulin resistance, unspecified: Secondary | ICD-10-CM | POA: Diagnosis not present

## 2024-03-01 DIAGNOSIS — Z6825 Body mass index (BMI) 25.0-25.9, adult: Secondary | ICD-10-CM

## 2024-03-01 DIAGNOSIS — K5903 Drug induced constipation: Secondary | ICD-10-CM

## 2024-03-01 DIAGNOSIS — R635 Abnormal weight gain: Secondary | ICD-10-CM

## 2024-03-01 DIAGNOSIS — E782 Mixed hyperlipidemia: Secondary | ICD-10-CM

## 2024-03-01 DIAGNOSIS — E66811 Obesity, class 1: Secondary | ICD-10-CM

## 2024-03-01 DIAGNOSIS — K50919 Crohn's disease, unspecified, with unspecified complications: Secondary | ICD-10-CM

## 2024-03-01 DIAGNOSIS — E669 Obesity, unspecified: Secondary | ICD-10-CM | POA: Insufficient documentation

## 2024-03-01 DIAGNOSIS — F250 Schizoaffective disorder, bipolar type: Secondary | ICD-10-CM | POA: Diagnosis not present

## 2024-03-01 DIAGNOSIS — T4395XA Adverse effect of unspecified psychotropic drug, initial encounter: Secondary | ICD-10-CM

## 2024-03-01 DIAGNOSIS — Z79899 Other long term (current) drug therapy: Secondary | ICD-10-CM

## 2024-03-01 MED ORDER — TIRZEPATIDE-WEIGHT MANAGEMENT 2.5 MG/0.5ML ~~LOC~~ SOLN: 2.5000 mg | SUBCUTANEOUS | 0 refills | Status: DC

## 2024-03-01 NOTE — Assessment & Plan Note (Signed)
 Obesity with insulin  resistance Obesity with associated insulin  resistance. Significant weight loss of 42.7 pounds (18.9%) since July 2023. Body fat percentage reduced from 26% to 21%. Visceral fat rating decreased from 10 to 7. Improved insulin  resistance with weight loss. Currently on low dose medication for weight loss maintenance. Medication aids in appetite suppression and offsets weight gain from psychotropic medications. Reports less hunger, more energy, and better focus. Experienced one bout of serious nausea and some constipation, potentially exacerbated by the medication. - Continue current low dose medication for weight loss maintenance - Encourage adherence to [employer] calorie nutrition plan - Encourage regular exercise regimen - Monitor for side effects such as nausea and constipation

## 2024-03-01 NOTE — Assessment & Plan Note (Signed)
 Crohn's disease with recent minor bleeding in stools. Managed with Imuran  and Humira. No current use of rifaximin, previously prescribed for bacterial overgrowth. - Continue Imuran  100 mg daily - Continue Humira 40 mg weekly injection

## 2024-03-01 NOTE — Progress Notes (Signed)
 Office: [phone]  /  Fax: [phone]  Weight Summary and Body Composition Analysis (BIA)  Vitals Temp: 97.9 F (36.6 C) BP: 96/64 Pulse Rate: 68 SpO2: 99 %   Anthropometric Measurements Height: 5' 11 (1.803 m) Weight: 183 lb (83 kg) BMI (Calculated): 25.53 Weight at Last Visit: 183lb Weight Lost Since Last Visit: 0lb Weight Gained Since Last Visit: 0lb Starting Weight: 210lb Total Weight Loss (lbs): 27 lb (12.2 kg) Peak Weight: 227lb   Body Composition  Body Fat %: 21.8 % Fat Mass (lbs): 40 lbs Muscle Mass (lbs): 136.6 lbs Total Body Water (lbs): 95.4 lbs Visceral Fat Rating : 7    RMR: 2045  Today's Visit #: 20  Starting Date: 06/17/22   Subjective   Chief Complaint: Obesity  Interval History Discussed the use of AI scribe software for clinical note transcription with the patient, who gave verbal consent to proceed.  History of Present Illness Seth Fields is a 33 year old male with insulin  resistance and Crohn's disease who presents for medical weight management.  Since starting the medical weight management program in December 2023, he has lost 42.7 pounds, approximately 18.9% of his body weight, from a peak of 225 pounds. His body fat percentage has decreased from 26% to 21%, and his visceral fat rating has improved from 10 to 7. He does not skip meals and reports eating more whole foods.  He is using a medication for weight management, having taken approximately five doses of 2.5 mg. Since starting the medication, he has experienced reduced hunger pains, increased energy, better focus, and a sense of control over his body. He reports one serious bout of nausea and some constipation, which he is managing with Miramax. He also noted a small amount of blood in his stools about a week and a half ago, which he attributes to his history of Crohn's disease.  His current medication regimen includes Abilify  400 mg injection every four weeks, Adderall,  Klonopin , Cymbalta, Lamictal , Vyvanse, Perphenazine, Imuran  100 mg daily, folic acid  1 mg, Cogentin 0.5 mg at night, and Humira 40 mg weekly. He also takes B complex, vitamin D , and omega-3 fatty acids. He is no longer on Caplyta or metformin . No recent changes to his mental health medications.  He has a history of Crohn's disease, which has been managed with Imuran  and Humira. He experienced a small amount of blood in his stools recently, which he has seen before due to his Crohn's disease. He is not currently taking rifaximin, which was previously prescribed for bacterial overgrowth.    Challenges affecting patient progress: presence of obesogenic drugs.    Pharmacotherapy for weight management: He is currently taking Zepbound  with adequate clinical response  and without side effects..   Assessment and Plan   Treatment Plan For Obesity:  Recommended Dietary Goals  Denali is currently in the action stage of change. As such, his goal is to continue weight management plan. He has agreed to: continue current plan  Behavioral Health and Counseling  We discussed the following behavioral modification strategies today: continue to work on maintaining a reduced calorie state, getting the recommended amount of protein, incorporating whole foods, making healthy choices, staying well hydrated and practicing mindfulness when eating. and increase protein intake, fibrous foods (25 grams per day for women, 30 grams for men) and water to improve satiety and decrease hunger signals. .  Additional education and resources provided today: None  Recommended Physical Activity Goals  Montrey has been advised to  work up to 150 minutes of moderate intensity aerobic activity a week and strengthening exercises 2-3 times per week for cardiovascular health, weight loss maintenance and preservation of muscle mass.  He has agreed to :  Increase volume of physical activity to a goal of 240 minutes a week and Combine  aerobic and strengthening exercises for efficiency and improved cardiometabolic health.  Medical Interventions and Pharmacotherapy  We discussed various medication options to help Jahiem with his weight loss efforts and we both agreed to : Adequate clinical response to anti-obesity medication, continue current regimen and for weight loss mainteneance  Associated Conditions Impacted by Obesity Treatment  Assessment & Plan Insulin  resistance Obesity, Class I, BMI 30-34.9 Generalized obesity with starting BMI of 30 Weight gain due to medication Obesity with insulin  resistance Obesity with associated insulin  resistance. Significant weight loss of 42.7 pounds (18.9%) since July 2023. Body fat percentage reduced from 26% to 21%. Visceral fat rating decreased from 10 to 7. Improved insulin  resistance with weight loss. Currently on low dose medication for weight loss maintenance. Medication aids in appetite suppression and offsets weight gain from psychotropic medications. Reports less hunger, more energy, and better focus. Experienced one bout of serious nausea and some constipation, potentially exacerbated by the medication. - Continue current low dose medication for weight loss maintenance - Encourage adherence to [employer] calorie nutrition plan - Encourage regular exercise regimen - Monitor for side effects such as nausea and constipation Polypharmacy Polypharmacy with multiple medications for various conditions. Medication reconciliation performed, and several medications were removed. Current medications include Abilify , Adderall, Klonopin , Cymbalta, Lamictal , Vyvanse, Perphenazine, Cogentin, Imuran , folic acid , and others. Discontinued Caplyta and confirmed non-use of rifaximin and naltrexone. - Perform regular medication reconciliation to prevent unnecessary polypharmacy - Ensure all medications are necessary and beneficial for current health conditions Crohn's disease with complication,  unspecified gastrointestinal tract location [facility]) Crohn's disease with recent minor bleeding in stools. Managed with Imuran  and Humira. No current use of rifaximin, previously prescribed for bacterial overgrowth. - Continue Imuran  100 mg daily - Continue Humira 40 mg weekly injection Drug-induced constipation Constipation with a recent episode of serious nausea and some blood in stools. Crohn's disease may contribute to gastrointestinal symptoms. Constipation was present before starting the current medication. - Continue Miramax for constipation management       Objective   Physical Exam:  Blood pressure 96/64, pulse 68, temperature 97.9 F (36.6 C), height 5' 11 (1.803 m), weight 183 lb (83 kg), SpO2 99%. Body mass index is 25.52 kg/m.  General: He is overweight, cooperative, alert, well developed, and in no acute distress. PSYCH: Has normal mood, affect and thought process.   HEENT: EOMI, sclerae are anicteric. Lungs: Normal breathing effort, no conversational dyspnea. Extremities: No edema.  Neurologic: No gross sensory or motor deficits. No tremors or fasciculations noted.    Diagnostic Data Reviewed:  BMET    Component Value Date/Time   NA 139 11/02/2023 0850   K 4.3 11/02/2023 0850   CL 101 11/02/2023 0850   CO2 23 11/02/2023 0850   GLUCOSE 100 (H) 11/02/2023 0850   GLUCOSE 98 01/27/2015 1715   BUN 17 11/02/2023 0850   CREATININE 1.06 11/02/2023 0850   CREATININE 1.28 08/08/2012 1555   CALCIUM 9.7 11/02/2023 0850   GFRNONAA >60 01/27/2015 1715   GFRAA >60 01/27/2015 1715   Lab Results  Component Value Date   HGBA1C 5.0 11/02/2023   HGBA1C 5.0 06/18/2022   Lab Results  Component Value Date  INSULIN  9.2 11/02/2023   INSULIN  23.4 06/18/2022   Lab Results  Component Value Date   TSH 2.000 06/18/2022   CBC    Component Value Date/Time   WBC 10.4 11/02/2023 0850   WBC 10.2 01/27/2015 1715   RBC 5.40 11/02/2023 0850   RBC 5.58 01/27/2015 1715   HGB  15.8 11/02/2023 0850   HCT 47.0 11/02/2023 0850   PLT 371 11/02/2023 0850   MCV 87 11/02/2023 0850   MCH 29.3 11/02/2023 0850   MCH 29.7 01/27/2015 1715   MCHC 33.6 11/02/2023 0850   MCHC 34.6 01/27/2015 1715   RDW 13.7 11/02/2023 0850   Iron Studies No results found for: IRON, TIBC, FERRITIN, IRONPCTSAT Lipid Panel     Component Value Date/Time   CHOL 207 (H) 11/02/2023 0850   TRIG 224 (H) 11/02/2023 0850   HDL 40 11/02/2023 0850   LDLCALC 127 (H) 11/02/2023 0850   Hepatic Function Panel     Component Value Date/Time   PROT 7.1 11/02/2023 0850   ALBUMIN 4.6 11/02/2023 0850   AST 21 11/02/2023 0850   ALT 19 11/02/2023 0850   ALKPHOS 87 11/02/2023 0850   BILITOT 0.5 11/02/2023 0850   BILIDIR <0.1 07/24/2011 2332   IBILI NOT CALCULATED 07/24/2011 2332      Component Value Date/Time   TSH 2.000 06/18/2022 0919   Nutritional Lab Results  Component Value Date   VD25OH 75.9 11/02/2023   VD25OH 38.6 06/18/2022    Medications: Outpatient Encounter Medications as of 03/01/2024  Medication Sig Note   Adalimumab 40 MG/0.8ML PSKT Inject 40 mg into the skin every 14 (fourteen) days. 02/05/2015: Next injection due on Thursday 02/08/15.    albuterol  (VENTOLIN  HFA) 108 (90 Base) MCG/ACT inhaler Inhale 2 puffs into the lungs every 6 (six) hours as needed for wheezing.    amphetamine-dextroamphetamine (ADDERALL) 20 MG tablet Take 1 tablet by mouth daily in the afternoon.    ARIPiprazole  ER (ABILIFY  MAINTENA) 400 MG PRSY prefilled syringe Inject once monthly.    ARIPiprazole  ER (ABILIFY  MAINTENA) 400 MG PRSY prefilled syringe Inject once monthly    ARIPiprazole  ER (ABILIFY  MAINTENA) 400 MG PRSY prefilled syringe Inject 400 mg into the muscle every month    ARIPiprazole  ER (ABILIFY  MAINTENA) 400 MG PRSY prefilled syringe q monthly    ARIPiprazole  ER (ABILIFY  MAINTENA) 400 MG PRSY prefilled syringe Inject into the muscle every 30 (thirty) days.    ARIPiprazole  ER (ABILIFY   MAINTENA) 400 MG PRSY prefilled syringe Inject IM once monthly.    ARIPiprazole  ER (ABILIFY  MAINTENA) 400 MG PRSY prefilled syringe q monthly    ascorbic acid (VITAMIN C) 500 MG tablet Take 1 tablet by mouth daily.    azaTHIOprine  (IMURAN ) 50 MG tablet Take 100 mg by mouth daily. 02/05/2015: Pharmacy verified medication.    B Complex-Biotin-FA (B-50 COMPLEX PO) Take 1 capsule by mouth daily.    benztropine (COGENTIN) 0.5 MG tablet Take 0.5 mg by mouth daily.    CAPLYTA 21 MG CAPS Take 1 capsule by mouth every morning.    cholecalciferol  (VITAMIN D ) 1000 UNITS tablet Take 5,000 Units by mouth daily.    clonazePAM  (KLONOPIN ) 1 MG tablet Take 1 mg by mouth 2 (two) times daily as needed for anxiety. 02/05/2015: Pt mother verified medication.   DULoxetine (CYMBALTA) 60 MG capsule Take 60 mg by mouth 2 (two) times daily.    folic acid  (FOLVITE ) 1 MG tablet Take 1 mg by mouth daily. 02/05/2015: Pharmacy verified medication.   lamoTRIgine  (  LAMICTAL  XR) 50 MG 24 hour tablet Take 1 tablet by mouth at bedtime. 02/05/2015: Pharmacy verified medication.   lisdexamfetamine (VYVANSE) 70 MG capsule Take 1 capsule by mouth in the morning.    Misc Natural Products (T-RELIEF CBD+13 SL) Take 1 capsule by mouth daily. 10/08/2020: CBD OIL CAPSULE   Multiple Vitamins-Minerals (MULTIVITAMINS THER. W/MINERALS) TABS Take 1 tablet by mouth daily.    naltrexone (DEPADE) 50 MG tablet Take 50 mg by mouth daily.    Niacin, Antihyperlipidemic, 500 MG TABS Take by mouth.    Omega-3 Fatty Acids (FISH OIL) 1000 MG CAPS Take 1 capsule by mouth daily.    perphenazine (TRILAFON) 2 MG tablet Take 2 mg by mouth at bedtime.    SUMAtriptan  (IMITREX ) 50 MG tablet Take 1 tablet (50 mg total) by mouth as needed for migraine.    tirzepatide  (ZEPBOUND ) 2.5 MG/0.5ML injection vial Inject 2.5 mg into the skin once a week.    XIFAXAN 200 MG tablet Take 200 mg by mouth 2 (two) times daily.    metFORMIN  (GLUCOPHAGE -XR) 500 MG 24 hr tablet Take 1 tablet  (500 mg total) by mouth 2 (two) times daily with a meal. (Patient not taking: Reported on 03/01/2024)    No facility-administered encounter medications on file as of 03/01/2024.     Follow-Up   No follow-ups on file.SABRA He was informed of the importance of frequent follow up visits to maximize his success with intensive lifestyle modifications for his multiple health conditions.  Attestation Statement   Reviewed by clinician on day of visit: allergies, medications, problem list, medical history, surgical history, family history, social history, and previous encounter notes.     Lucas Parker, MD

## 2024-03-01 NOTE — Assessment & Plan Note (Signed)
 Polypharmacy with multiple medications for various conditions. Medication reconciliation performed, and several medications were removed. Current medications include Abilify , Adderall, Klonopin , Cymbalta, Lamictal , Vyvanse, Perphenazine, Cogentin, Imuran , folic acid , and others. Discontinued Caplyta and confirmed non-use of rifaximin and naltrexone. - Perform regular medication reconciliation to prevent unnecessary polypharmacy - Ensure all medications are necessary and beneficial for current health conditions

## 2024-03-02 DIAGNOSIS — F250 Schizoaffective disorder, bipolar type: Secondary | ICD-10-CM | POA: Diagnosis not present

## 2024-03-15 DIAGNOSIS — F250 Schizoaffective disorder, bipolar type: Secondary | ICD-10-CM | POA: Diagnosis not present

## 2024-03-16 DIAGNOSIS — F3181 Bipolar II disorder: Secondary | ICD-10-CM | POA: Diagnosis not present

## 2024-03-16 DIAGNOSIS — F902 Attention-deficit hyperactivity disorder, combined type: Secondary | ICD-10-CM | POA: Diagnosis not present

## 2024-03-16 DIAGNOSIS — F1210 Cannabis abuse, uncomplicated: Secondary | ICD-10-CM | POA: Diagnosis not present

## 2024-03-16 DIAGNOSIS — F250 Schizoaffective disorder, bipolar type: Secondary | ICD-10-CM | POA: Diagnosis not present

## 2024-03-16 DIAGNOSIS — F251 Schizoaffective disorder, depressive type: Secondary | ICD-10-CM | POA: Diagnosis not present

## 2024-03-16 DIAGNOSIS — Z5181 Encounter for therapeutic drug level monitoring: Secondary | ICD-10-CM | POA: Diagnosis not present

## 2024-03-16 DIAGNOSIS — Z1389 Encounter for screening for other disorder: Secondary | ICD-10-CM | POA: Diagnosis not present

## 2024-03-29 DIAGNOSIS — F250 Schizoaffective disorder, bipolar type: Secondary | ICD-10-CM | POA: Diagnosis not present

## 2024-03-29 DIAGNOSIS — F902 Attention-deficit hyperactivity disorder, combined type: Secondary | ICD-10-CM | POA: Diagnosis not present

## 2024-03-30 DIAGNOSIS — F250 Schizoaffective disorder, bipolar type: Secondary | ICD-10-CM | POA: Diagnosis not present

## 2024-04-06 ENCOUNTER — Telehealth (INDEPENDENT_AMBULATORY_CARE_PROVIDER_SITE_OTHER): Payer: Self-pay | Admitting: Internal Medicine

## 2024-04-06 ENCOUNTER — Other Ambulatory Visit (INDEPENDENT_AMBULATORY_CARE_PROVIDER_SITE_OTHER): Payer: Self-pay

## 2024-04-06 DIAGNOSIS — E88819 Insulin resistance, unspecified: Secondary | ICD-10-CM

## 2024-04-06 DIAGNOSIS — E66811 Obesity, class 1: Secondary | ICD-10-CM

## 2024-04-06 MED ORDER — TIRZEPATIDE-WEIGHT MANAGEMENT 2.5 MG/0.5ML ~~LOC~~ SOLN: 2.5000 mg | SUBCUTANEOUS | 0 refills | Status: DC

## 2024-04-06 NOTE — Telephone Encounter (Signed)
 N/a

## 2024-04-12 DIAGNOSIS — F250 Schizoaffective disorder, bipolar type: Secondary | ICD-10-CM | POA: Diagnosis not present

## 2024-04-21 ENCOUNTER — Ambulatory Visit (INDEPENDENT_AMBULATORY_CARE_PROVIDER_SITE_OTHER): Admitting: Internal Medicine

## 2024-04-21 VITALS — BP 112/72 | HR 64 | Temp 98.0°F | Ht 71.0 in | Wt 182.0 lb

## 2024-04-21 DIAGNOSIS — E669 Obesity, unspecified: Secondary | ICD-10-CM

## 2024-04-21 DIAGNOSIS — E88819 Insulin resistance, unspecified: Secondary | ICD-10-CM | POA: Diagnosis not present

## 2024-04-21 DIAGNOSIS — F250 Schizoaffective disorder, bipolar type: Secondary | ICD-10-CM

## 2024-04-21 DIAGNOSIS — K50819 Crohn's disease of both small and large intestine with unspecified complications: Secondary | ICD-10-CM | POA: Diagnosis not present

## 2024-04-21 DIAGNOSIS — Z6825 Body mass index (BMI) 25.0-25.9, adult: Secondary | ICD-10-CM

## 2024-04-21 MED ORDER — TIRZEPATIDE-WEIGHT MANAGEMENT 2.5 MG/0.5ML ~~LOC~~ SOLN: 2.5000 mg | SUBCUTANEOUS | 0 refills | Status: DC

## 2024-04-21 NOTE — Assessment & Plan Note (Signed)
 Stable on biologic.  GLP-1 may also be of benefit due to its anti-inflammatory properties.  Continue current regimen

## 2024-04-21 NOTE — Assessment & Plan Note (Signed)
 Weight: decrease of 43.7 lb (19.4%) over 2 years, 3 months  Start: 01/08/2022 225 lb 11.2 oz (102.4 kg)  End: 04/21/2024 182 lb (82.6 kg)  Seth Fields has been on Zepbound  for weight loss maintenance for three months now.  No side effects reported.  He adheres to a 1200 calorie nutrition plan 90% of the time and exercises 4-5 days a week for 45 minutes. His body fat percentage is 21%, and muscle mass is maintained. The current low dose of Zepbound  is used for weight loss maintenance, anti-inflammatory properties, and mental health improvements.   - Renew Zepbound  for a three-month supply - Ensure adequate protein intake of at least 90 grams per day - Encourage consumption of fruits and vegetables for fiber - Advise on maintaining hydration with at least 90 ounces of water per day - Discuss the benefits of making smoothies at home with fresh or frozen fruit, protein milk, and spinach or kale

## 2024-04-21 NOTE — Assessment & Plan Note (Signed)
 Improved.  He has lost close to 20% of total body weight.  He is also now on Zepbound  for pharmacoprophylaxis no longer on metformin .  Continue current weight management strategy

## 2024-04-21 NOTE — Assessment & Plan Note (Signed)
 Patient and mother have noticed improvements in mental health since starting GLP-1.  Continue low-dose Zepbound .  Continue psychotropic medication and follow-up with mental health care provider.

## 2024-04-21 NOTE — Progress Notes (Signed)
 Office: [phone]  /  Fax: [phone]  Weight Summary and Body Composition Analysis (BIA)  Vitals Temp: 98 F (36.7 C) BP: 112/72 Pulse Rate: 64 SpO2: 99 %   Anthropometric Measurements Height: 5' 11 (1.803 m) Weight: 182 lb (82.6 kg) BMI (Calculated): 25.4 Weight at Last Visit: 183 lb Weight Lost Since Last Visit: 1 lb Weight Gained Since Last Visit: 0 lb Starting Weight: 210 lb Total Weight Loss (lbs): 28 lb (12.7 kg) Peak Weight: 227 lb   Body Composition  Body Fat %: 21.1 % Fat Mass (lbs): 38.4 lbs Muscle Mass (lbs): 136.6 lbs Total Body Water (lbs): 94.8 lbs Visceral Fat Rating : 7    RMR: 2045  Today's Visit #: 21  Starting Date: 06/17/22   Subjective   Chief Complaint: Obesity  Interval History Discussed the use of AI scribe software for clinical note transcription with the patient, who gave verbal consent to proceed.  History of Present Illness Seth Fields is a 33 year old male who presents for medical weight management.   He has been on Zepbound  for weight loss maintenance for approximately three months, experiencing occasional constipation and a single episode of unusual stomach emptying sensation about two months ago. No nausea or vomiting. He experiences some morning weakness, describing his limbs as feeling 'like jelly,' but denies shakiness, sweating, or lightheadedness. He attributes this to possibly needing more water intake.  He follows a 1200 calorie nutrition plan 90% of the time and exercises four to five days a week for 45 minutes. He plays tennis and engages in strength training with a trainer twice a week, although he has missed a few sessions in the past month. He has stopped taking metformin  and is currently on a low dose of Zepbound . He also takes Vyvanse and Adderall, which he notes help with his morning routine once he starts drinking water.  He reports improvements in his mental health, noting 'less frustration' and  'less mood swings.' He has reduced cravings for alcohol and feels more satisfied and less desperate. However, he still experiences an impulse to buy THC vaping products when he gets paid, although he vapes less than before.  His Crohn's disease has been stable, with no significant pain episodes. He mentions needing to be more conscious of his water intake to manage constipation, which improves once he starts his morning routine with Vyvanse, Adderall, and water. He drinks fruit smoothies and muscle milk but acknowledges the need to prioritize water over muscle milk when thirsty.     Challenges affecting patient progress: none.    Pharmacotherapy for weight management: He is currently taking Zepbound  with adequate clinical response  and without side effects..   Assessment and Plan   Treatment Plan For Obesity:  Recommended Dietary Goals  Seth Fields is currently in the action stage of change. As such, his goal is to continue weight management plan. He has agreed to: continue current plan  Behavioral Health and Counseling  We discussed the following behavioral modification strategies today: continue to work on maintaining a reduced calorie state, getting the recommended amount of protein, incorporating whole foods, making healthy choices, staying well hydrated and practicing mindfulness when eating. and increase protein intake, fibrous foods (25 grams per day for women, 30 grams for men) and water to improve satiety and decrease hunger signals. .  Additional education and resources provided today: None  Recommended Physical Activity Goals  Seth Fields has been advised to work up to 150 minutes of moderate intensity aerobic  activity a week and strengthening exercises 2-3 times per week for cardiovascular health, weight loss maintenance and preservation of muscle mass.  He has agreed to :  Think about enjoyable ways to increase daily physical activity and overcoming barriers to exercise, Increase  physical activity in their day and reduce sedentary time (increase NEAT)., Increase volume of physical activity to a goal of 240 minutes a week, and Combine aerobic and strengthening exercises for efficiency and improved cardiometabolic health.  Medical Interventions and Pharmacotherapy  We discussed various medication options to help Seth Fields with his weight loss efforts and we both agreed to : Adequate clinical response to anti-obesity medication, continue current regimen and on low-dose Zepbound  for weight loss maintenance  Associated Conditions Impacted by Obesity Treatment  Assessment & Plan Insulin  resistance Improved.  He has lost close to 20% of total body weight.  He is also now on Zepbound  for pharmacoprophylaxis no longer on metformin .  Continue current weight management strategy Generalized obesity with starting BMI of 30 BMI 25.0-25.9,adult Weight: decrease of 43.7 lb (19.4%) over 2 years, 3 months  Start: 01/08/2022 225 lb 11.2 oz (102.4 kg)  End: 04/21/2024 182 lb (82.6 kg)  Seth Fields has been on Zepbound  for weight loss maintenance for three months now.  No side effects reported.  He adheres to a 1200 calorie nutrition plan 90% of the time and exercises 4-5 days a week for 45 minutes. His body fat percentage is 21%, and muscle mass is maintained. The current low dose of Zepbound  is used for weight loss maintenance, anti-inflammatory properties, and mental health improvements.   - Renew Zepbound  for a three-month supply - Ensure adequate protein intake of at least 90 grams per day - Encourage consumption of fruits and vegetables for fiber - Advise on maintaining hydration with at least 90 ounces of water per day - Discuss the benefits of making smoothies at home with fresh or frozen fruit, protein milk, and spinach or kale Crohn's disease of both small and large intestine with complication (HCC) Stable on biologic.  GLP-1 may also be of benefit due to its anti-inflammatory properties.   Continue current regimen Schizoaffective disorder, bipolar type [facility]) Patient and mother have noticed improvements in mental health since starting GLP-1.  Continue low-dose Zepbound .  Continue psychotropic medication and follow-up with mental health care provider.         Objective   Physical Exam:  Blood pressure 112/72, pulse 64, temperature 98 F (36.7 C), height 5' 11 (1.803 m), weight 182 lb (82.6 kg), SpO2 99%. Body mass index is 25.38 kg/m.  General: He is overweight, cooperative, alert, well developed, and in no acute distress. PSYCH: Has normal mood, affect and thought process.   HEENT: EOMI, sclerae are anicteric. Lungs: Normal breathing effort, no conversational dyspnea. Extremities: No edema.  Neurologic: No gross sensory or motor deficits. No tremors or fasciculations noted.    Diagnostic Data Reviewed:  BMET    Component Value Date/Time   NA 139 11/02/2023 0850   K 4.3 11/02/2023 0850   CL 101 11/02/2023 0850   CO2 23 11/02/2023 0850   GLUCOSE 100 (H) 11/02/2023 0850   GLUCOSE 98 01/27/2015 1715   BUN 17 11/02/2023 0850   CREATININE 1.06 11/02/2023 0850   CREATININE 1.28 08/08/2012 1555   CALCIUM 9.7 11/02/2023 0850   GFRNONAA >60 01/27/2015 1715   GFRAA >60 01/27/2015 1715   Lab Results  Component Value Date   HGBA1C 5.0 11/02/2023   HGBA1C 5.0 06/18/2022  Lab Results  Component Value Date   INSULIN  9.2 11/02/2023   INSULIN  23.4 06/18/2022   Lab Results  Component Value Date   TSH 2.000 06/18/2022   CBC    Component Value Date/Time   WBC 10.4 11/02/2023 0850   WBC 10.2 01/27/2015 1715   RBC 5.40 11/02/2023 0850   RBC 5.58 01/27/2015 1715   HGB 15.8 11/02/2023 0850   HCT 47.0 11/02/2023 0850   PLT 371 11/02/2023 0850   MCV 87 11/02/2023 0850   MCH 29.3 11/02/2023 0850   MCH 29.7 01/27/2015 1715   MCHC 33.6 11/02/2023 0850   MCHC 34.6 01/27/2015 1715   RDW 13.7 11/02/2023 0850   Iron Studies No results found for: IRON,  TIBC, FERRITIN, IRONPCTSAT Lipid Panel     Component Value Date/Time   CHOL 207 (H) 11/02/2023 0850   TRIG 224 (H) 11/02/2023 0850   HDL 40 11/02/2023 0850   LDLCALC 127 (H) 11/02/2023 0850   Hepatic Function Panel     Component Value Date/Time   PROT 7.1 11/02/2023 0850   ALBUMIN 4.6 11/02/2023 0850   AST 21 11/02/2023 0850   ALT 19 11/02/2023 0850   ALKPHOS 87 11/02/2023 0850   BILITOT 0.5 11/02/2023 0850   BILIDIR <0.1 07/24/2011 2332   IBILI NOT CALCULATED 07/24/2011 2332      Component Value Date/Time   TSH 2.000 06/18/2022 0919   Nutritional Lab Results  Component Value Date   VD25OH 75.9 11/02/2023   VD25OH 38.6 06/18/2022    Medications: Outpatient Encounter Medications as of 04/21/2024  Medication Sig Note   Adalimumab 40 MG/0.8ML PSKT Inject 40 mg into the skin every 14 (fourteen) days. 02/05/2015: Next injection due on Thursday 02/08/15.    albuterol  (VENTOLIN  HFA) 108 (90 Base) MCG/ACT inhaler Inhale 2 puffs into the lungs every 6 (six) hours as needed for wheezing.    amphetamine-dextroamphetamine (ADDERALL) 20 MG tablet Take 1 tablet by mouth daily in the afternoon.    ARIPiprazole  ER (ABILIFY  MAINTENA) 400 MG PRSY prefilled syringe q monthly    ascorbic acid (VITAMIN C) 500 MG tablet Take 1 tablet by mouth daily.    azaTHIOprine  (IMURAN ) 50 MG tablet Take 100 mg by mouth daily. 02/05/2015: Pharmacy verified medication.    B Complex-Biotin-FA (B-50 COMPLEX PO) Take 1 capsule by mouth daily.    benztropine (COGENTIN) 0.5 MG tablet Take 0.5 mg by mouth daily.    cholecalciferol  (VITAMIN D ) 1000 UNITS tablet Take 5,000 Units by mouth daily.    clonazePAM  (KLONOPIN ) 1 MG tablet Take 1 mg by mouth 2 (two) times daily as needed for anxiety. 02/05/2015: Pt mother verified medication.   DULoxetine (CYMBALTA) 60 MG capsule Take 60 mg by mouth 2 (two) times daily.    folic acid  (FOLVITE ) 1 MG tablet Take 1 mg by mouth daily. 02/05/2015: Pharmacy verified medication.    lamoTRIgine  (LAMICTAL  XR) 50 MG 24 hour tablet Take 1 tablet by mouth at bedtime. 02/05/2015: Pharmacy verified medication.   lisdexamfetamine (VYVANSE) 70 MG capsule Take 1 capsule by mouth in the morning.    Multiple Vitamins-Minerals (MULTIVITAMINS THER. W/MINERALS) TABS Take 1 tablet by mouth daily.    Omega-3 Fatty Acids (FISH OIL) 1000 MG CAPS Take 1 capsule by mouth daily.    perphenazine (TRILAFON) 2 MG tablet Take 2 mg by mouth at bedtime.    SUMAtriptan  (IMITREX ) 50 MG tablet Take 1 tablet (50 mg total) by mouth as needed for migraine.    [DISCONTINUED] tirzepatide  (ZEPBOUND )  2.5 MG/0.5ML injection vial Inject 2.5 mg into the skin once a week.    tirzepatide  (ZEPBOUND ) 2.5 MG/0.5ML injection vial Inject 2.5 mg into the skin once a week.    No facility-administered encounter medications on file as of 04/21/2024.     Follow-Up   Return in about 8 weeks (around 06/16/2024) for For Weight Mangement with Dr. Francyne.SABRA He was informed of the importance of frequent follow up visits to maximize his success with intensive lifestyle modifications for his multiple health conditions.  Attestation Statement   Reviewed by clinician on day of visit: allergies, medications, problem list, medical history, surgical history, family history, social history, and previous encounter notes.     Lucas Francyne, MD

## 2024-04-27 DIAGNOSIS — F250 Schizoaffective disorder, bipolar type: Secondary | ICD-10-CM | POA: Diagnosis not present

## 2024-04-28 DIAGNOSIS — D225 Melanocytic nevi of trunk: Secondary | ICD-10-CM | POA: Diagnosis not present

## 2024-04-28 DIAGNOSIS — L821 Other seborrheic keratosis: Secondary | ICD-10-CM | POA: Diagnosis not present

## 2024-04-28 DIAGNOSIS — Z85828 Personal history of other malignant neoplasm of skin: Secondary | ICD-10-CM | POA: Diagnosis not present

## 2024-04-28 DIAGNOSIS — L570 Actinic keratosis: Secondary | ICD-10-CM | POA: Diagnosis not present

## 2024-04-28 DIAGNOSIS — D224 Melanocytic nevi of scalp and neck: Secondary | ICD-10-CM | POA: Diagnosis not present

## 2024-05-10 DIAGNOSIS — F250 Schizoaffective disorder, bipolar type: Secondary | ICD-10-CM | POA: Diagnosis not present

## 2024-05-11 DIAGNOSIS — F250 Schizoaffective disorder, bipolar type: Secondary | ICD-10-CM | POA: Diagnosis not present

## 2024-05-24 ENCOUNTER — Telehealth (INDEPENDENT_AMBULATORY_CARE_PROVIDER_SITE_OTHER): Payer: Self-pay | Admitting: Internal Medicine

## 2024-05-24 ENCOUNTER — Other Ambulatory Visit (INDEPENDENT_AMBULATORY_CARE_PROVIDER_SITE_OTHER): Payer: Self-pay

## 2024-05-24 DIAGNOSIS — E669 Obesity, unspecified: Secondary | ICD-10-CM

## 2024-05-24 DIAGNOSIS — F250 Schizoaffective disorder, bipolar type: Secondary | ICD-10-CM | POA: Diagnosis not present

## 2024-05-24 DIAGNOSIS — E88819 Insulin resistance, unspecified: Secondary | ICD-10-CM

## 2024-05-24 MED ORDER — TIRZEPATIDE-WEIGHT MANAGEMENT 2.5 MG/0.5ML ~~LOC~~ SOLN: 2.5000 mg | SUBCUTANEOUS | 0 refills | Status: DC

## 2024-05-24 NOTE — Telephone Encounter (Signed)
 Left msg to send message with the medciation and dose

## 2024-05-24 NOTE — Telephone Encounter (Signed)
 Patient's mother Arlean called stating that the patient (her son) is out of Zepbound  and needs a refill. Please contact the patient.

## 2024-05-25 DIAGNOSIS — F250 Schizoaffective disorder, bipolar type: Secondary | ICD-10-CM | POA: Diagnosis not present

## 2024-06-06 ENCOUNTER — Ambulatory Visit (INDEPENDENT_AMBULATORY_CARE_PROVIDER_SITE_OTHER)

## 2024-06-07 DIAGNOSIS — F250 Schizoaffective disorder, bipolar type: Secondary | ICD-10-CM | POA: Diagnosis not present

## 2024-06-16 ENCOUNTER — Ambulatory Visit (INDEPENDENT_AMBULATORY_CARE_PROVIDER_SITE_OTHER): Payer: Self-pay | Admitting: Internal Medicine

## 2024-06-16 ENCOUNTER — Telehealth (HOSPITAL_BASED_OUTPATIENT_CLINIC_OR_DEPARTMENT_OTHER): Payer: Self-pay | Admitting: Family Medicine

## 2024-06-16 VITALS — BP 117/74 | HR 95 | Temp 97.7°F | Ht 71.0 in | Wt 185.0 lb

## 2024-06-16 DIAGNOSIS — T451X5A Adverse effect of antineoplastic and immunosuppressive drugs, initial encounter: Secondary | ICD-10-CM | POA: Diagnosis not present

## 2024-06-16 DIAGNOSIS — K5903 Drug induced constipation: Secondary | ICD-10-CM | POA: Diagnosis not present

## 2024-06-16 DIAGNOSIS — T4395XA Adverse effect of unspecified psychotropic drug, initial encounter: Secondary | ICD-10-CM | POA: Diagnosis not present

## 2024-06-16 DIAGNOSIS — K50919 Crohn's disease, unspecified, with unspecified complications: Secondary | ICD-10-CM

## 2024-06-16 DIAGNOSIS — T50905A Adverse effect of unspecified drugs, medicaments and biological substances, initial encounter: Secondary | ICD-10-CM

## 2024-06-16 DIAGNOSIS — Z6825 Body mass index (BMI) 25.0-25.9, adult: Secondary | ICD-10-CM | POA: Diagnosis not present

## 2024-06-16 DIAGNOSIS — E88819 Insulin resistance, unspecified: Secondary | ICD-10-CM

## 2024-06-16 DIAGNOSIS — E669 Obesity, unspecified: Secondary | ICD-10-CM | POA: Diagnosis not present

## 2024-06-16 MED ORDER — TIRZEPATIDE-WEIGHT MANAGEMENT 2.5 MG/0.5ML ~~LOC~~ SOLN: 2.5000 mg | SUBCUTANEOUS | 2 refills | Status: DC

## 2024-06-16 NOTE — Assessment & Plan Note (Signed)
 Lavel is on immunosuppressants and psychotropic medication which may affect his weight.  He is interested in possibly going down on the Abilify  and will be talking to his psychiatrist about this.

## 2024-06-16 NOTE — Progress Notes (Signed)
 Office: [phone]  /  Fax: [phone]  Weight Summary and Body Composition Analysis (BIA)  Vitals Temp: 97.7 F (36.5 C) BP: 117/74 Pulse Rate: 95 SpO2: 97 %   Anthropometric Measurements Height: 5' 11 (1.803 m) Weight: 185 lb (83.9 kg) BMI (Calculated): 25.81 Weight at Last Visit: 182 lb Weight Lost Since Last Visit: 0 lb Weight Gained Since Last Visit: 3 lb Starting Weight: 210 lb Total Weight Loss (lbs): 25 lb (11.3 kg) Peak Weight: 227 lb   Body Composition  Body Fat %: 22.6 % Fat Mass (lbs): 42 lbs Muscle Mass (lbs): 136.4 lbs Total Body Water (lbs): 97.2 lbs Visceral Fat Rating : 7    RMR: 2045  Today's Visit #: 22  Starting Date: 06/17/22   Subjective   Chief Complaint: Obesity  Interval History Discussed the use of AI scribe software for clinical note transcription with the patient, who gave verbal consent to proceed.  History of Present Illness Seth Fields is a 33 year old male with Crohn's disease who presents for medical weight management.  Since last office visit he has gained 3 pounds.  He is following a 1200-calorie target with good adherence.  He is exercising 4 to 5 days a week 45 minutes doing endurance and strengthening.  He experiences constipation about once or twice a week, describing it as requiring significant effort to pass stools. He uses Miralax for relief, which is effective. He also experiences dehydration at night and has noticed blood in his stools one to three times a week. He has hemorrhoids and uses a suppository with some relief.  He has a history of Crohn's disease and underwent a colonoscopy last September, which showed no visible signs of concern. His current medications include a medication that causes constipation, Zepbound  for weight management.  He has reduced his waist size from 41-42 inches to 36 inches since starting the weight management program in December 2023. He coaches middle school basketball,  plays tennis, lifts weights, and reads extensively. He has significantly reduced his alcohol intake, now only occasionally drinking, and smokes less, using CBD and vaping THC, which increases his appetite. He uses pre-workout supplements, which he feels may be too stimulating.  His body composition shows a muscle mass of 136 pounds and a body fat percentage of 21-22%. His BMI is 25. He is working on increasing his physical activity, including cardio and strength training, to manage his weight and improve his fitness.     Challenges affecting patient progress: none.    Pharmacotherapy for weight management: He is currently taking Zepbound  with adequate clinical response  and experiencing the following side effects: constipation.   Assessment and Plan   Treatment Plan For Obesity:  Recommended Dietary Goals  Orlanda is currently in the action stage of change. As such, his goal is to continue weight management plan. He has agreed to: continue current plan  Behavioral Health and Counseling  We discussed the following behavioral modification strategies today: continue to work on maintaining a reduced calorie state, getting the recommended amount of protein, incorporating whole foods, making healthy choices, staying well hydrated and practicing mindfulness when eating. and increase protein intake, fibrous foods (25 grams per day for women, 30 grams for men) and water to improve satiety and decrease hunger signals. .  Additional education and resources provided today: None  Recommended Physical Activity Goals  Rylee has been advised to work up to 150 minutes of moderate intensity aerobic activity a week and strengthening exercises 2-3  times per week for cardiovascular health, weight loss maintenance and preservation of muscle mass.  He has agreed to :  Think about enjoyable ways to increase daily physical activity and overcoming barriers to exercise, Increase physical activity in their day  and reduce sedentary time (increase NEAT)., Increase volume of physical activity to a goal of 240 minutes a week, and Combine aerobic and strengthening exercises for efficiency and improved cardiometabolic health.  Medical Interventions and Pharmacotherapy  We discussed various medication options to help Stefanos with his weight loss efforts and we both agreed to : Adequate clinical response to anti-obesity medication, continue current regimen and do not recommend further increases in GLP-1 due to gastrointestinal side effects  Associated Conditions Impacted by Obesity Treatment  Assessment & Plan Insulin  resistance Improved.  He has lost close to 20% of total body weight.  He is also now on Zepbound  for pharmacoprophylaxis no longer on metformin .  Continue current weight management strategy Generalized obesity with starting BMI of 30 BMI 25.0-25.9,adult Weight: decrease of 43.7 lb (19.4%) over 2 years, 3 months  Start: 01/08/2022 225 lb 11.2 oz (102.4 kg)  End: 04/21/2024 182 lb (82.6 kg)  Augie has been on Zepbound  for weight loss maintenance for 4 to 5 months months now.  No side effects reported other than occasional constipation.  He adheres to a 1200 calorie nutrition plan 90% of the time and exercises 4-5 days a week for 45 minutes. His body fat percentage is 21%, and muscle mass is maintained. The current low dose of Zepbound  is used for weight loss maintenance, anti-inflammatory properties, and mental health improvements.  Management is ongoing with a focus on weight loss and maintenance. Current BMI is 25 with a body fat percentage of 21-22%. Muscle mass is well-maintained at 136 pounds. Weight loss has slowed due to reaching essential fat levels. Current medication, Zepbound , is effective at the current dose, but increasing the dose is not recommended due to constipation concerns. Discussed the potential neurological effects of Zepbound  and its impact on satiety and digestion. Emphasized the  importance of protein intake, physical activity, and exercise progression for continued weight management. - Continue Zepbound  at current dose with one refill at a time. - Encouraged protein intake of 90-120 grams per day. - Recommended 240 minutes of physical activity per week, including cardio and strength training. - Advised on interval training and increasing treadmill slope for intensity. - Discussed potential tapering of Abilify  with a slow reduction if considering discontinuation. - Renew Zepbound  for a three-month supply - Ensure adequate protein intake of at least 90 grams per day - Encourage consumption of fruits and vegetables for fiber approximately 30 g/day - Advise on maintaining hydration with at least 90 ounces of water per day - Discuss the benefits of making smoothies at home with fresh or frozen fruit, protein milk, and spinach or kale Crohn's disease with complication, unspecified gastrointestinal tract location [facility]) Crohn's disease with recent minor bleeding in stools. Managed with Imuran  and Humira. - Continue Imuran  100 mg daily - Continue Humira 40 mg weekly injection Weight gain due to medication Sohaib is on immunosuppressants and psychotropic medication which may affect his weight.  He is interested in possibly going down on the Abilify  and will be talking to his psychiatrist about this. Drug-induced constipation Medication-induced constipation with rectal bleeding and hemorrhoids Constipation occurs once or twice a week, relieved by Miralax. Rectal bleeding is likely due to straining and possible micro tears or fissures. Hemorrhoids are present, with some  relief from suppositories. Dehydration at night may contribute to constipation. Current medications may also contribute to constipation. Discussed dietary modifications to increase fiber and hydration to alleviate symptoms. - Increase water intake to 90 ounces per day. - Increase dietary fiber intake to 30 grams per  day through fruits, vegetables, whole grains, and supplements. - Consider regular use of stool softeners. - Add spinach, chia seeds, and flax seeds to diet for additional fiber.          Objective   Physical Exam:  Blood pressure 117/74, pulse 95, temperature 97.7 F (36.5 C), height 5' 11 (1.803 m), weight 185 lb (83.9 kg), SpO2 97%. Body mass index is 25.8 kg/m.  General: He is overweight, cooperative, alert, well developed, and in no acute distress. PSYCH: Has normal mood, affect and thought process.   HEENT: EOMI, sclerae are anicteric. Lungs: Normal breathing effort, no conversational dyspnea. Extremities: No edema.  Neurologic: No gross sensory or motor deficits. No tremors or fasciculations noted.    Diagnostic Data Reviewed:  BMET    Component Value Date/Time   NA 139 11/02/2023 0850   K 4.3 11/02/2023 0850   CL 101 11/02/2023 0850   CO2 23 11/02/2023 0850   GLUCOSE 100 (H) 11/02/2023 0850   GLUCOSE 98 01/27/2015 1715   BUN 17 11/02/2023 0850   CREATININE 1.06 11/02/2023 0850   CREATININE 1.28 08/08/2012 1555   CALCIUM 9.7 11/02/2023 0850   GFRNONAA >60 01/27/2015 1715   GFRAA >60 01/27/2015 1715   Lab Results  Component Value Date   HGBA1C 5.0 11/02/2023   HGBA1C 5.0 06/18/2022   Lab Results  Component Value Date   INSULIN  9.2 11/02/2023   INSULIN  23.4 06/18/2022   Lab Results  Component Value Date   TSH 2.000 06/18/2022   CBC    Component Value Date/Time   WBC 10.4 11/02/2023 0850   WBC 10.2 01/27/2015 1715   RBC 5.40 11/02/2023 0850   RBC 5.58 01/27/2015 1715   HGB 15.8 11/02/2023 0850   HCT 47.0 11/02/2023 0850   PLT 371 11/02/2023 0850   MCV 87 11/02/2023 0850   MCH 29.3 11/02/2023 0850   MCH 29.7 01/27/2015 1715   MCHC 33.6 11/02/2023 0850   MCHC 34.6 01/27/2015 1715   RDW 13.7 11/02/2023 0850   Iron Studies No results found for: IRON, TIBC, FERRITIN, IRONPCTSAT Lipid Panel     Component Value Date/Time   CHOL 207  (H) 11/02/2023 0850   TRIG 224 (H) 11/02/2023 0850   HDL 40 11/02/2023 0850   LDLCALC 127 (H) 11/02/2023 0850   Hepatic Function Panel     Component Value Date/Time   PROT 7.1 11/02/2023 0850   ALBUMIN 4.6 11/02/2023 0850   AST 21 11/02/2023 0850   ALT 19 11/02/2023 0850   ALKPHOS 87 11/02/2023 0850   BILITOT 0.5 11/02/2023 0850   BILIDIR <0.1 07/24/2011 2332   IBILI NOT CALCULATED 07/24/2011 2332      Component Value Date/Time   TSH 2.000 06/18/2022 0919   Nutritional Lab Results  Component Value Date   VD25OH 75.9 11/02/2023   VD25OH 38.6 06/18/2022    Medications: Outpatient Encounter Medications as of 06/16/2024  Medication Sig Note   Adalimumab 40 MG/0.8ML PSKT Inject 40 mg into the skin every 14 (fourteen) days. 02/05/2015: Next injection due on Thursday 02/08/15.    albuterol  (VENTOLIN  HFA) 108 (90 Base) MCG/ACT inhaler Inhale 2 puffs into the lungs every 6 (six) hours as needed for wheezing.  amphetamine-dextroamphetamine (ADDERALL) 20 MG tablet Take 1 tablet by mouth daily in the afternoon.    ARIPiprazole  ER (ABILIFY  MAINTENA) 400 MG PRSY prefilled syringe q monthly    ascorbic acid (VITAMIN C) 500 MG tablet Take 1 tablet by mouth daily.    azaTHIOprine  (IMURAN ) 50 MG tablet Take 100 mg by mouth daily. 02/05/2015: Pharmacy verified medication.    B Complex-Biotin-FA (B-50 COMPLEX PO) Take 1 capsule by mouth daily.    benztropine (COGENTIN) 0.5 MG tablet Take 0.5 mg by mouth daily.    cholecalciferol  (VITAMIN D ) 1000 UNITS tablet Take 5,000 Units by mouth daily.    clonazePAM  (KLONOPIN ) 1 MG tablet Take 1 mg by mouth 2 (two) times daily as needed for anxiety. 02/05/2015: Pt mother verified medication.   DULoxetine (CYMBALTA) 60 MG capsule Take 60 mg by mouth 2 (two) times daily.    folic acid  (FOLVITE ) 1 MG tablet Take 1 mg by mouth daily. 02/05/2015: Pharmacy verified medication.   lamoTRIgine  (LAMICTAL  XR) 50 MG 24 hour tablet Take 1 tablet by mouth at bedtime. 02/05/2015:  Pharmacy verified medication.   lisdexamfetamine (VYVANSE) 70 MG capsule Take 1 capsule by mouth in the morning.    Multiple Vitamins-Minerals (MULTIVITAMINS THER. W/MINERALS) TABS Take 1 tablet by mouth daily.    Omega-3 Fatty Acids (FISH OIL) 1000 MG CAPS Take 1 capsule by mouth daily.    perphenazine (TRILAFON) 2 MG tablet Take 2 mg by mouth at bedtime.    SUMAtriptan  (IMITREX ) 50 MG tablet Take 1 tablet (50 mg total) by mouth as needed for migraine.    tirzepatide  (ZEPBOUND ) 2.5 MG/0.5ML injection vial Inject 2.5 mg into the skin once a week.    [DISCONTINUED] tirzepatide  (ZEPBOUND ) 2.5 MG/0.5ML injection vial Inject 2.5 mg into the skin once a week.    No facility-administered encounter medications on file as of 06/16/2024.     Follow-Up   Return in about 6 weeks (around 07/28/2024) for For Weight Mangement with Dr. Francyne.SABRA He was informed of the importance of frequent follow up visits to maximize his success with intensive lifestyle modifications for his multiple health conditions.  Attestation Statement   Reviewed by clinician on day of visit: allergies, medications, problem list, medical history, surgical history, family history, social history, and previous encounter notes.     Lucas Francyne, MD

## 2024-06-16 NOTE — Assessment & Plan Note (Signed)
 Improved.  He has lost close to 20% of total body weight.  He is also now on Zepbound  for pharmacoprophylaxis no longer on metformin .  Continue current weight management strategy

## 2024-06-16 NOTE — Telephone Encounter (Signed)
 Copied from CRM #[id number]. Topic: Clinical - Lab/Test Results >> Jun 16, 2024  8:39 AM Wess RAMAN wrote: Reason for CRM: Patient's mom, Arlean, would like to know if the labs that the patient had done at [facility] will be good for [facility] and Dr. De Cuba as well  Callback #: [phone]

## 2024-06-16 NOTE — Telephone Encounter (Signed)
 Patient's mother Wilbur) confirmed that the last time he had labs drawn was in July. She was made aware because they were drawn 5 months ago that more labs may need to be drawn.

## 2024-06-16 NOTE — Assessment & Plan Note (Signed)
 Weight: decrease of 43.7 lb (19.4%) over 2 years, 3 months  Start: 01/08/2022 225 lb 11.2 oz (102.4 kg)  End: 04/21/2024 182 lb (82.6 kg)  Seth Fields has been on Zepbound  for weight loss maintenance for 4 to 5 months months now.  No side effects reported other than occasional constipation.  He adheres to a 1200 calorie nutrition plan 90% of the time and exercises 4-5 days a week for 45 minutes. His body fat percentage is 21%, and muscle mass is maintained. The current low dose of Zepbound  is used for weight loss maintenance, anti-inflammatory properties, and mental health improvements.  Management is ongoing with a focus on weight loss and maintenance. Current BMI is 25 with a body fat percentage of 21-22%. Muscle mass is well-maintained at 136 pounds. Weight loss has slowed due to reaching essential fat levels. Current medication, Zepbound , is effective at the current dose, but increasing the dose is not recommended due to constipation concerns. Discussed the potential neurological effects of Zepbound  and its impact on satiety and digestion. Emphasized the importance of protein intake, physical activity, and exercise progression for continued weight management. - Continue Zepbound  at current dose with one refill at a time. - Encouraged protein intake of 90-120 grams per day. - Recommended 240 minutes of physical activity per week, including cardio and strength training. - Advised on interval training and increasing treadmill slope for intensity. - Discussed potential tapering of Abilify  with a slow reduction if considering discontinuation. - Renew Zepbound  for a three-month supply - Ensure adequate protein intake of at least 90 grams per day - Encourage consumption of fruits and vegetables for fiber approximately 30 g/day - Advise on maintaining hydration with at least 90 ounces of water per day - Discuss the benefits of making smoothies at home with fresh or frozen fruit, protein milk, and spinach or kale

## 2024-06-16 NOTE — Assessment & Plan Note (Signed)
 Weight: decrease of 43.7 lb (19.4%) over 2 years, 3 months  Start: 01/08/2022 225 lb 11.2 oz (102.4 kg)  End: 04/21/2024 182 lb (82.6 kg)  Saber has been on Zepbound  for weight loss maintenance for 4 to 5 months months now.  No side effects reported other than occasional constipation.  He adheres to a 1200 calorie nutrition plan 90% of the time and exercises 4-5 days a week for 45 minutes. His body fat percentage is 21%, and muscle mass is maintained. The current low dose of Zepbound  is used for weight loss maintenance, anti-inflammatory properties, and mental health improvements.  Management is ongoing with a focus on weight loss and maintenance. Current BMI is 25 with a body fat percentage of 21-22%. Muscle mass is well-maintained at 136 pounds. Weight loss has slowed due to reaching essential fat levels. Current medication, Zepbound , is effective at the current dose, but increasing the dose is not recommended due to constipation concerns. Discussed the potential neurological effects of Zepbound  and its impact on satiety and digestion. Emphasized the importance of protein intake, physical activity, and exercise progression for continued weight management. - Continue Zepbound  at current dose with one refill at a time. - Encouraged protein intake of 90-120 grams per day. - Recommended 240 minutes of physical activity per week, including cardio and strength training. - Advised on interval training and increasing treadmill slope for intensity. - Discussed potential tapering of Abilify  with a slow reduction if considering discontinuation. - Renew Zepbound  for a three-month supply - Ensure adequate protein intake of at least 90 grams per day - Encourage consumption of fruits and vegetables for fiber approximately 30 g/day - Advise on maintaining hydration with at least 90 ounces of water per day - Discuss the benefits of making smoothies at home with fresh or frozen fruit, protein milk, and spinach or kale

## 2024-06-16 NOTE — Assessment & Plan Note (Signed)
 Medication-induced constipation with rectal bleeding and hemorrhoids Constipation occurs once or twice a week, relieved by Miralax. Rectal bleeding is likely due to straining and possible micro tears or fissures. Hemorrhoids are present, with some relief from suppositories. Dehydration at night may contribute to constipation. Current medications may also contribute to constipation. Discussed dietary modifications to increase fiber and hydration to alleviate symptoms. - Increase water intake to 90 ounces per day. - Increase dietary fiber intake to 30 grams per day through fruits, vegetables, whole grains, and supplements. - Consider regular use of stool softeners. - Add spinach, chia seeds, and flax seeds to diet for additional fiber.

## 2024-06-16 NOTE — Assessment & Plan Note (Signed)
 Crohn's disease with recent minor bleeding in stools. Managed with Imuran  and Humira. - Continue Imuran  100 mg daily - Continue Humira 40 mg weekly injection

## 2024-06-20 NOTE — Telephone Encounter (Signed)
 Spoke with patient's mother Wilbur) and informed her that patient can have fasting labs drawn at next office visit with Dr. De Cuba. All questions and concerns have been addressed.

## 2024-06-21 DIAGNOSIS — F250 Schizoaffective disorder, bipolar type: Secondary | ICD-10-CM | POA: Diagnosis not present

## 2024-06-22 ENCOUNTER — Ambulatory Visit (INDEPENDENT_AMBULATORY_CARE_PROVIDER_SITE_OTHER): Admitting: Family Medicine

## 2024-06-22 ENCOUNTER — Encounter (HOSPITAL_BASED_OUTPATIENT_CLINIC_OR_DEPARTMENT_OTHER): Payer: Self-pay | Admitting: Family Medicine

## 2024-06-22 VITALS — BP 103/63 | HR 90 | Temp 97.6°F | Resp 18 | Ht 71.0 in | Wt 188.0 lb

## 2024-06-22 DIAGNOSIS — E559 Vitamin D deficiency, unspecified: Secondary | ICD-10-CM

## 2024-06-22 DIAGNOSIS — E66811 Obesity, class 1: Secondary | ICD-10-CM

## 2024-06-22 DIAGNOSIS — E782 Mixed hyperlipidemia: Secondary | ICD-10-CM

## 2024-06-22 DIAGNOSIS — J4520 Mild intermittent asthma, uncomplicated: Secondary | ICD-10-CM | POA: Diagnosis not present

## 2024-06-22 DIAGNOSIS — E538 Deficiency of other specified B group vitamins: Secondary | ICD-10-CM | POA: Diagnosis not present

## 2024-06-22 DIAGNOSIS — F250 Schizoaffective disorder, bipolar type: Secondary | ICD-10-CM | POA: Diagnosis not present

## 2024-06-22 DIAGNOSIS — Z0000 Encounter for general adult medical examination without abnormal findings: Secondary | ICD-10-CM

## 2024-06-22 MED ORDER — ALBUTEROL SULFATE HFA 108 (90 BASE) MCG/ACT IN AERS: 2.0000 | INHALATION_SPRAY | Freq: Four times a day (QID) | RESPIRATORY_TRACT | 3 refills | Status: AC | PRN

## 2024-06-22 NOTE — Assessment & Plan Note (Signed)
 Chronic problem for patient, generally symptoms are infrequent, denies any regular issues, typically symptoms occur with acute illness.  Requesting refill for albuterol  inhaler to have available as needed.  Refill sent to pharmacy on file

## 2024-06-22 NOTE — Assessment & Plan Note (Signed)
 Most recent lipid panel earlier this year with slightly elevated total cholesterol as well as LDL cholesterol.  He does continue to work on lifestyle modification, weight loss.  Has had notable weight loss working through healthy weight loss clinic.  Has been utilizing Zepbound  recently Recommend continuing with lifestyle modifications, continued follow-up with healthy weight and wellness We will plan to recheck labs today to assess current status of cholesterol panel

## 2024-06-22 NOTE — Assessment & Plan Note (Signed)
 We reviewed general recommendations.  Can continue with current medication regimen.  Discussed appropriate exercise regimen, recommend at least 150 minutes/week of moderate intensity aerobic exercise as well as incorporating about 2 days/week of strength training.  Recommend continuing to follow-up with healthy weight and wellness clinic as well.

## 2024-06-22 NOTE — Progress Notes (Signed)
° ° °  Procedures performed today:    None.  Independent interpretation of notes and tests performed by another provider:   None.  Brief History, Exam, Impression, and Recommendations:    BP 103/63 (BP Location: Left Arm, Patient Position: Sitting, Cuff Size: Normal)   Pulse 90   Temp 97.6 F (36.4 C) (Oral)   Resp 18   Ht 5' 11 (1.803 m)   Wt 188 lb (85.3 kg)   SpO2 96%   BMI 26.22 kg/m   Mixed hyperlipidemia Assessment & Plan: Most recent lipid panel earlier this year with slightly elevated total cholesterol as well as LDL cholesterol.  He does continue to work on lifestyle modification, weight loss.  Has had notable weight loss working through healthy weight loss clinic.  Has been utilizing Zepbound  recently Recommend continuing with lifestyle modifications, continued follow-up with healthy weight and wellness We will plan to recheck labs today to assess current status of cholesterol panel  Orders: -     Lipid panel  Mild intermittent asthma, unspecified whether complicated Assessment & Plan: Chronic problem for patient, generally symptoms are infrequent, denies any regular issues, typically symptoms occur with acute illness.  Requesting refill for albuterol  inhaler to have available as needed.  Refill sent to pharmacy on file  Orders: -     Albuterol  Sulfate HFA; Inhale 2 puffs into the lungs every 6 (six) hours as needed for wheezing.  Dispense: 2 each; Refill: 3  Obesity, Class I with current BMI of 25 Assessment & Plan: We reviewed general recommendations.  Can continue with current medication regimen.  Discussed appropriate exercise regimen, recommend at least 150 minutes/week of moderate intensity aerobic exercise as well as incorporating about 2 days/week of strength training.  Recommend continuing to follow-up with healthy weight and wellness clinic as well.  Orders: -     CBC with Differential/Platelet -     Comprehensive metabolic panel with GFR -     Lipid  panel -     TSH Rfx on Abnormal to Free T4 -     Hemoglobin A1c  Wellness examination -     CBC with Differential/Platelet -     Comprehensive metabolic panel with GFR -     Lipid panel -     TSH Rfx on Abnormal to Free T4 -     Hemoglobin A1c  B12 deficiency -     Vitamin B12  Vitamin D  deficiency -     VITAMIN D  25 Hydroxy (Vit-D Deficiency, Fractures)  Return in about 6 months (around 12/21/2024) for CPE.   ___________________________________________ Renesmee Raine de [place], MD, ABFM, CAQSM Primary Care and Sports Medicine [facility]

## 2024-06-23 ENCOUNTER — Ambulatory Visit (HOSPITAL_BASED_OUTPATIENT_CLINIC_OR_DEPARTMENT_OTHER): Payer: Self-pay | Admitting: Family Medicine

## 2024-06-23 LAB — CBC WITH DIFFERENTIAL/PLATELET
Basophils Absolute: 0 x10E3/uL (ref 0.0–0.2)
Basos: 0 %
EOS (ABSOLUTE): 0.4 x10E3/uL (ref 0.0–0.4)
Eos: 3 %
Hematocrit: 45.1 % (ref 37.5–51.0)
Hemoglobin: 15.1 g/dL (ref 13.0–17.7)
Immature Grans (Abs): 0.1 x10E3/uL (ref 0.0–0.1)
Immature Granulocytes: 1 %
Lymphocytes Absolute: 2.5 x10E3/uL (ref 0.7–3.1)
Lymphs: 23 %
MCH: 29.8 pg (ref 26.6–33.0)
MCHC: 33.5 g/dL (ref 31.5–35.7)
MCV: 89 fL (ref 79–97)
Monocytes Absolute: 0.6 x10E3/uL (ref 0.1–0.9)
Monocytes: 5 %
Neutrophils Absolute: 7.5 x10E3/uL — ABNORMAL HIGH (ref 1.4–7.0)
Neutrophils: 68 %
Platelets: 392 x10E3/uL (ref 150–450)
RBC: 5.06 x10E6/uL (ref 4.14–5.80)
RDW: 13.5 % (ref 11.6–15.4)
WBC: 11.1 x10E3/uL — ABNORMAL HIGH (ref 3.4–10.8)

## 2024-06-23 LAB — COMPREHENSIVE METABOLIC PANEL WITH GFR
ALT: 12 IU/L (ref 0–44)
AST: 15 IU/L (ref 0–40)
Albumin: 4.3 g/dL (ref 4.1–5.1)
Alkaline Phosphatase: 70 IU/L (ref 47–123)
BUN/Creatinine Ratio: 24 — ABNORMAL HIGH (ref 9–20)
BUN: 21 mg/dL — ABNORMAL HIGH (ref 6–20)
Bilirubin Total: 0.4 mg/dL (ref 0.0–1.2)
CO2: 23 mmol/L (ref 20–29)
Calcium: 9.9 mg/dL (ref 8.7–10.2)
Chloride: 100 mmol/L (ref 96–106)
Creatinine, Ser: 0.86 mg/dL (ref 0.76–1.27)
Globulin, Total: 2.8 g/dL (ref 1.5–4.5)
Glucose: 75 mg/dL (ref 70–99)
Potassium: 4.7 mmol/L (ref 3.5–5.2)
Sodium: 136 mmol/L (ref 134–144)
Total Protein: 7.1 g/dL (ref 6.0–8.5)
eGFR: 116 mL/min/1.73 (ref >59)

## 2024-06-23 LAB — LIPID PANEL
Chol/HDL Ratio: 5.8 ratio — ABNORMAL HIGH (ref 0.0–5.0)
Cholesterol, Total: 144 mg/dL (ref 100–199)
HDL: 25 mg/dL — ABNORMAL LOW (ref >39)
LDL Chol Calc (NIH): 88 mg/dL (ref 0–99)
Triglycerides: 179 mg/dL — ABNORMAL HIGH (ref 0–149)
VLDL Cholesterol Cal: 31 mg/dL (ref 5–40)

## 2024-06-23 LAB — VITAMIN D 25 HYDROXY (VIT D DEFICIENCY, FRACTURES): Vit D, 25-Hydroxy: 67.9 ng/mL (ref 30.0–100.0)

## 2024-06-23 LAB — TSH RFX ON ABNORMAL TO FREE T4: TSH: 1.45 u[IU]/mL (ref 0.450–4.500)

## 2024-06-23 LAB — HEMOGLOBIN A1C
Est. average glucose Bld gHb Est-mCnc: 97 mg/dL
Hgb A1c MFr Bld: 5 % (ref 4.8–5.6)

## 2024-06-23 LAB — VITAMIN B12: Vitamin B-12: 1421 pg/mL — ABNORMAL HIGH (ref 232–1245)

## 2024-07-28 ENCOUNTER — Encounter (INDEPENDENT_AMBULATORY_CARE_PROVIDER_SITE_OTHER): Payer: Self-pay | Admitting: Internal Medicine

## 2024-07-28 ENCOUNTER — Ambulatory Visit (INDEPENDENT_AMBULATORY_CARE_PROVIDER_SITE_OTHER): Admitting: Internal Medicine

## 2024-07-28 VITALS — BP 106/70 | HR 98 | Temp 97.3°F | Ht 71.0 in | Wt 185.0 lb

## 2024-07-28 DIAGNOSIS — Z6825 Body mass index (BMI) 25.0-25.9, adult: Secondary | ICD-10-CM | POA: Diagnosis not present

## 2024-07-28 DIAGNOSIS — E669 Obesity, unspecified: Secondary | ICD-10-CM

## 2024-07-28 DIAGNOSIS — E88819 Insulin resistance, unspecified: Secondary | ICD-10-CM

## 2024-07-28 DIAGNOSIS — F250 Schizoaffective disorder, bipolar type: Secondary | ICD-10-CM | POA: Diagnosis not present

## 2024-07-28 DIAGNOSIS — K50919 Crohn's disease, unspecified, with unspecified complications: Secondary | ICD-10-CM

## 2024-07-28 MED ORDER — TIRZEPATIDE-WEIGHT MANAGEMENT 2.5 MG/0.5ML ~~LOC~~ SOLN: 2.5000 mg | SUBCUTANEOUS | 2 refills | Status: AC

## 2024-07-28 NOTE — Progress Notes (Unsigned)
 "  Office: [phone]  /  Fax: [phone]  Weight Summary and Body Composition Analysis (BIA)  Vitals Temp: (!) 97.3 F (36.3 C) BP: 106/70 Pulse Rate: 98 SpO2: 96 %   Anthropometric Measurements Height: 5' 11 (1.803 m) Weight: 185 lb (83.9 kg) BMI (Calculated): 25.81 Weight at Last Visit: 185 lb Weight Lost Since Last Visit: 0 lb Weight Gained Since Last Visit: lb Starting Weight: 210 lb Total Weight Loss (lbs): 25 lb (11.3 kg) Peak Weight: 227 lb   Body Composition  Body Fat %: 22.9 % Fat Mass (lbs): 42.4 lbs Muscle Mass (lbs): 135.8 lbs Total Body Water (lbs): 95.2 lbs Visceral Fat Rating : 7    RMR: 2045  Today's Visit #: 23  Starting Date: 06/17/22   Subjective   Chief Complaint: Obesity  Interval History  Discussed the use of AI scribe software for clinical note transcription with the patient, who gave verbal consent to proceed.  History of Present Illness Seth Fields is a 34 year old male with insulin  resistance and hyperlipidemia who presents for medical weight management.  He is currently on Zepbound  2.5 mg once a week for weight management. He has successfully lost 40 pounds over two years, which is approximately 18% of his total body weight. He follows a 1200 calorie meal plan with a 30/40/30 macronutrient distribution and uses meal replacements and healthy eating guides. His waist size has decreased from 41 inches to 36 inches.  He experiences constipation, with bowel movements occurring once every two days, and occasionally uses Miralax to manage this symptom. His bowel movements have slowed, possibly due to eating less, and he experiences some straining but no bloating.  His sleep pattern includes sleeping for about 10 hours per night, which he finds excessive as it affects his daily activities. He wishes to wake up earlier, around 6:30 or 7:00 AM, as he used to.  He has resumed playing tennis recreationally and finds it beneficial for  exercise and enjoyment. He has also reduced alcohol consumption and smoking, attributing this to decreased cravings. Alcohol no longer appeals to him, and smoking is less appealing as well.  He has a history of inflammatory bowel disease, which is currently well-managed. He is on Imuran  and Humira for this condition.  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 He is applying to local stores and considering opportunities that align with his interests and career goals. He is also involved in assistant basketball coaching, which has increased his social interaction.     Challenges affecting patient progress: none.    Pharmacotherapy for weight management: He is currently taking Zepbound  with adequate clinical response  and without side effects..   Assessment and Plan   Treatment Plan For Obesity:  Recommended Dietary Goals  Seth Fields is currently in the action stage of change. As such, his goal is to continue weight management plan. He has agreed to: continue current reduced-calorie meal plan  Behavioral Health and Counseling  We discussed the following behavioral modification strategies today: continue to work on maintaining a reduced calorie state, getting the recommended amount of protein, incorporating whole foods, making healthy choices, staying well hydrated and practicing mindfulness when eating. and increase protein intake, fibrous foods (25 grams per day for women, 30 grams for men) and water to improve satiety and decrease hunger signals. .  Additional education and resources provided today: None  Recommended Physical Activity Goals  Seth Fields has  been advised to work up to 150 minutes of moderate intensity aerobic activity a week and strengthening exercises 2-3 times per week for cardiovascular health, weight loss maintenance and preservation of muscle mass.  He has agreed to :  Think about  enjoyable ways to increase daily physical activity and overcoming barriers to exercise, Increase physical activity in their day and reduce sedentary time (increase NEAT)., Increase volume of physical activity to a goal of 240 minutes a week, and Combine aerobic and strengthening exercises for efficiency and improved cardiometabolic health.  Medical Interventions and Pharmacotherapy  We discussed various medication options to help Seth Fields with his weight loss efforts and we both agreed to : Adequate clinical response to anti-obesity medication, continue current anti-obesity regimen  Associated Conditions Impacted by Obesity Treatment  Assessment & Plan Insulin  resistance Improved.  He has lost close to 20% of total body weight.  He is also now on Zepbound  for pharmacoprophylaxis no longer on metformin .  Continue current weight management strategy Generalized obesity with starting BMI of 30 Weight: decrease of 43.7 lb (19.4%) over 2 years, 3 months  Start: 01/08/2022 225 lb 11.2 oz (102.4 kg)  End: 04/21/2024 182 lb (82.6 kg)  Obesity managed with Zepbound  2.5 mg weekly. Significant weight loss of 40 pounds over two years, approximately 18% of total body weight. Current BMI is 25, indicating a healthier weight range. Appetite control is good with no significant hunger pangs. Engages in regular physical activity, including tennis and basketball coaching, contributing to weight maintenance. Discussed potential for further weight loss and strategies if desired. - Continue Zepbound  2.5 mg weekly - Provided 1200 calorie meal plan with 30-40-30 macronutrient distribution - Provided guide on using meal replacement and healthy eating - Encouraged regular physical activity, including tennis and basketball coaching Crohn's disease with complication, unspecified gastrointestinal tract location [facility]) Well-managed with no recent exacerbations. Weight loss and reduction in visceral fat may contribute to improved  immune regulation and reduced inflammation. Current medications include Imuran  and Humira, which modulate the immune system. Discussed potential future adjustments to immunosuppressant therapy as weight loss continues. - Continue current medications: Imuran  and Humira - Monitor for any signs of exacerbation or need for steroid use Schizoaffective disorder, bipolar type (HCC) Improved.  Currently on Abilify , Adderall, Cogentin, clonazepam , duloxetine, Lamictal  with good adherence.  Patient has also noticed a decrease in cravings for THC and alcohol since starting GLP-1.  Anecdotally also has seen an improvement in auditory hallucinations.          Objective   Physical Exam:  Blood pressure 106/70, pulse 98, temperature (!) 97.3 F (36.3 C), height 5' 11 (1.803 m), weight 185 lb (83.9 kg), SpO2 96%. Body mass index is 25.8 kg/m.  General: He is overweight, cooperative, alert, well developed, and in no acute distress. PSYCH: Has normal mood, affect and thought process.   HEENT: EOMI, sclerae are anicteric. Lungs: Normal breathing effort, no conversational dyspnea. Extremities: No edema.  Neurologic: No gross sensory or motor deficits. No tremors or fasciculations noted.    Diagnostic Data Reviewed:  BMET    Component Value Date/Time   NA 136 06/22/2024 0850   K 4.7 06/22/2024 0850   CL 100 06/22/2024 0850   CO2 23 06/22/2024 0850   GLUCOSE 75 06/22/2024 0850   GLUCOSE 98 01/27/2015 1715   BUN 21 (H) 06/22/2024 0850   CREATININE 0.86 06/22/2024 0850   CREATININE 1.28 08/08/2012 1555   CALCIUM 9.9 06/22/2024 0850   GFRNONAA >60 01/27/2015 1715  GFRAA >60 01/27/2015 1715   Lab Results  Component Value Date   HGBA1C 5.0 06/22/2024   HGBA1C 5.0 06/18/2022   Lab Results  Component Value Date   INSULIN  9.2 11/02/2023   INSULIN  23.4 06/18/2022   Lab Results  Component Value Date   TSH 1.450 06/22/2024   CBC    Component Value Date/Time   WBC 11.1 (H) 06/22/2024  0850   WBC 10.2 01/27/2015 1715   RBC 5.06 06/22/2024 0850   RBC 5.58 01/27/2015 1715   HGB 15.1 06/22/2024 0850   HCT 45.1 06/22/2024 0850   PLT 392 06/22/2024 0850   MCV 89 06/22/2024 0850   MCH 29.8 06/22/2024 0850   MCH 29.7 01/27/2015 1715   MCHC 33.5 06/22/2024 0850   MCHC 34.6 01/27/2015 1715   RDW 13.5 06/22/2024 0850   Iron Studies No results found for: IRON, TIBC, FERRITIN, IRONPCTSAT Lipid Panel     Component Value Date/Time   CHOL 144 06/22/2024 0850   TRIG 179 (H) 06/22/2024 0850   HDL 25 (L) 06/22/2024 0850   CHOLHDL 5.8 (H) 06/22/2024 0850   LDLCALC 88 06/22/2024 0850   Hepatic Function Panel     Component Value Date/Time   PROT 7.1 06/22/2024 0850   ALBUMIN 4.3 06/22/2024 0850   AST 15 06/22/2024 0850   ALT 12 06/22/2024 0850   ALKPHOS 70 06/22/2024 0850   BILITOT 0.4 06/22/2024 0850   BILIDIR <0.1 07/24/2011 2332   IBILI NOT CALCULATED 07/24/2011 2332      Component Value Date/Time   TSH 1.450 06/22/2024 0850   TSH 2.000 06/18/2022 0919   Nutritional Lab Results  Component Value Date   VD25OH 67.9 06/22/2024   VD25OH 75.9 11/02/2023   VD25OH 38.6 06/18/2022    Medications: Outpatient Encounter Medications as of 07/28/2024  Medication Sig Note   Adalimumab 40 MG/0.8ML PSKT Inject 40 mg into the skin every 14 (fourteen) days. 02/05/2015: Next injection due on Thursday 02/08/15.    albuterol  (VENTOLIN  HFA) 108 (90 Base) MCG/ACT inhaler Inhale 2 puffs into the lungs every 6 (six) hours as needed for wheezing.    amphetamine-dextroamphetamine (ADDERALL) 20 MG tablet Take 1 tablet by mouth daily in the afternoon.    ARIPiprazole  ER (ABILIFY  MAINTENA) 400 MG PRSY prefilled syringe q monthly    ascorbic acid (VITAMIN C) 500 MG tablet Take 1 tablet by mouth daily.    azaTHIOprine  (IMURAN ) 50 MG tablet Take 100 mg by mouth daily. 02/05/2015: Pharmacy verified medication.    B Complex-Biotin-FA (B-50 COMPLEX PO) Take 1 capsule by mouth daily.     benztropine (COGENTIN) 0.5 MG tablet Take 0.5 mg by mouth daily.    cholecalciferol  (VITAMIN D ) 1000 UNITS tablet Take 5,000 Units by mouth daily.    clonazePAM  (KLONOPIN ) 1 MG tablet Take 1 mg by mouth 2 (two) times daily as needed for anxiety. 02/05/2015: Pt mother verified medication.   DULoxetine (CYMBALTA) 60 MG capsule Take 60 mg by mouth 2 (two) times daily.    folic acid  (FOLVITE ) 1 MG tablet Take 1 mg by mouth daily. 02/05/2015: Pharmacy verified medication.   lamoTRIgine  (LAMICTAL  XR) 50 MG 24 hour tablet Take 1 tablet by mouth at bedtime. 02/05/2015: Pharmacy verified medication.   lisdexamfetamine (VYVANSE) 70 MG capsule Take 1 capsule by mouth in the morning.    Multiple Vitamins-Minerals (MULTIVITAMINS THER. W/MINERALS) TABS Take 1 tablet by mouth daily.    Omega-3 Fatty Acids (FISH OIL) 1000 MG CAPS Take 1 capsule by mouth  daily.    perphenazine (TRILAFON) 2 MG tablet Take 2 mg by mouth at bedtime.    SUMAtriptan  (IMITREX ) 50 MG tablet Take 1 tablet (50 mg total) by mouth as needed for migraine.    [DISCONTINUED] tirzepatide  (ZEPBOUND ) 2.5 MG/0.5ML injection vial Inject 2.5 mg into the skin once a week.    tirzepatide  (ZEPBOUND ) 2.5 MG/0.5ML injection vial Inject 2.5 mg into the skin once a week.    No facility-administered encounter medications on file as of 07/28/2024.     Follow-Up   Return in about 8 weeks (around 09/22/2024) for For Weight Mangement with Dr. Francyne.SABRA He was informed of the importance of frequent follow up visits to maximize his success with intensive lifestyle modifications for his multiple health conditions.  Attestation Statement   Reviewed by clinician on day of visit: allergies, medications, problem list, medical history, surgical history, family history, social history, and previous encounter notes.     Lucas Francyne, MD  "

## 2024-07-29 NOTE — Assessment & Plan Note (Signed)
 Well-managed with no recent exacerbations. Weight loss and reduction in visceral fat may contribute to improved immune regulation and reduced inflammation. Current medications include Imuran  and Humira, which modulate the immune system. Discussed potential future adjustments to immunosuppressant therapy as weight loss continues. - Continue current medications: Imuran  and Humira - Monitor for any signs of exacerbation or need for steroid use

## 2024-07-29 NOTE — Assessment & Plan Note (Signed)
 Improved.  Currently on Abilify , Adderall, Cogentin, clonazepam , duloxetine, Lamictal  with good adherence.  Patient has also noticed a decrease in cravings for THC and alcohol since starting GLP-1.  Anecdotally also has seen an improvement in auditory hallucinations.

## 2024-07-29 NOTE — Assessment & Plan Note (Signed)
 Weight: decrease of 43.7 lb (19.4%) over 2 years, 3 months  Start: 01/08/2022 225 lb 11.2 oz (102.4 kg)  End: 04/21/2024 182 lb (82.6 kg)  Obesity managed with Zepbound  2.5 mg weekly. Significant weight loss of 40 pounds over two years, approximately 18% of total body weight. Current BMI is 25, indicating a healthier weight range. Appetite control is good with no significant hunger pangs. Engages in regular physical activity, including tennis and basketball coaching, contributing to weight maintenance. Discussed potential for further weight loss and strategies if desired. - Continue Zepbound  2.5 mg weekly - Provided 1200 calorie meal plan with 30-40-30 macronutrient distribution - Provided guide on using meal replacement and healthy eating - Encouraged regular physical activity, including tennis and basketball coaching

## 2024-07-29 NOTE — Assessment & Plan Note (Signed)
 Improved.  He has lost close to 20% of total body weight.  He is also now on Zepbound  for pharmacoprophylaxis no longer on metformin .  Continue current weight management strategy

## 2024-09-27 ENCOUNTER — Ambulatory Visit (INDEPENDENT_AMBULATORY_CARE_PROVIDER_SITE_OTHER): Admitting: Internal Medicine

## 2024-12-22 ENCOUNTER — Encounter (HOSPITAL_BASED_OUTPATIENT_CLINIC_OR_DEPARTMENT_OTHER): Admitting: Family Medicine
